# Patient Record
Sex: Male | Born: 1945
Health system: Southern US, Community
[De-identification: ages and names within clinical notes are randomized; demographics above are authoritative.]

## PROBLEM LIST (undated history)

## (undated) DIAGNOSIS — I4891 Unspecified atrial fibrillation: Secondary | ICD-10-CM

## (undated) DIAGNOSIS — B182 Chronic viral hepatitis C: Secondary | ICD-10-CM

## (undated) DIAGNOSIS — I10 Essential (primary) hypertension: Secondary | ICD-10-CM

## (undated) DIAGNOSIS — K219 Gastro-esophageal reflux disease without esophagitis: Secondary | ICD-10-CM

## (undated) DIAGNOSIS — I509 Heart failure, unspecified: Secondary | ICD-10-CM

## (undated) DIAGNOSIS — G4730 Sleep apnea, unspecified: Secondary | ICD-10-CM

## (undated) DIAGNOSIS — E039 Hypothyroidism, unspecified: Secondary | ICD-10-CM

## (undated) DIAGNOSIS — M1990 Unspecified osteoarthritis, unspecified site: Secondary | ICD-10-CM

## (undated) DIAGNOSIS — N3289 Other specified disorders of bladder: Secondary | ICD-10-CM

## (undated) DIAGNOSIS — F1910 Other psychoactive substance abuse, uncomplicated: Secondary | ICD-10-CM

## (undated) DIAGNOSIS — A0472 Enterocolitis due to Clostridium difficile, not specified as recurrent: Secondary | ICD-10-CM

## (undated) DIAGNOSIS — G8929 Other chronic pain: Secondary | ICD-10-CM

## (undated) DIAGNOSIS — N319 Neuromuscular dysfunction of bladder, unspecified: Secondary | ICD-10-CM

## (undated) DIAGNOSIS — N400 Enlarged prostate without lower urinary tract symptoms: Secondary | ICD-10-CM

## (undated) DIAGNOSIS — D649 Anemia, unspecified: Secondary | ICD-10-CM

## (undated) DIAGNOSIS — E871 Hypo-osmolality and hyponatremia: Secondary | ICD-10-CM

## (undated) DIAGNOSIS — K769 Liver disease, unspecified: Secondary | ICD-10-CM

## (undated) DIAGNOSIS — E669 Obesity, unspecified: Secondary | ICD-10-CM

## (undated) DIAGNOSIS — Z5189 Encounter for other specified aftercare: Secondary | ICD-10-CM

## (undated) DIAGNOSIS — F329 Major depressive disorder, single episode, unspecified: Secondary | ICD-10-CM

## (undated) DIAGNOSIS — E785 Hyperlipidemia, unspecified: Secondary | ICD-10-CM

## (undated) DIAGNOSIS — Z22322 Carrier or suspected carrier of Methicillin resistant Staphylococcus aureus: Secondary | ICD-10-CM

## (undated) DIAGNOSIS — B1920 Unspecified viral hepatitis C without hepatic coma: Secondary | ICD-10-CM

## (undated) DIAGNOSIS — F32A Depression, unspecified: Secondary | ICD-10-CM

## (undated) HISTORY — DX: Hyperlipidemia, unspecified: E78.5

## (undated) HISTORY — PX: LUMBAR DISC SURGERY: SHX700

## (undated) HISTORY — PX: SUPRAPUBIC CATHETER INSERTION: SUR719

## (undated) HISTORY — DX: Obesity, unspecified: E66.9

## (undated) HISTORY — DX: Unspecified atrial fibrillation: I48.91

## (undated) HISTORY — DX: Liver disease, unspecified: K76.9

## (undated) HISTORY — DX: Hypo-osmolality and hyponatremia: E87.1

## (undated) HISTORY — DX: Major depressive disorder, single episode, unspecified: F32.9

## (undated) HISTORY — DX: Chronic viral hepatitis C: B18.2

## (undated) HISTORY — PX: COLONOSCOPY: SHX174

## (undated) HISTORY — DX: Benign prostatic hyperplasia without lower urinary tract symptoms: N40.0

## (undated) HISTORY — DX: Anemia, unspecified: D64.9

## (undated) HISTORY — DX: Other psychoactive substance abuse, uncomplicated: F19.10

## (undated) HISTORY — DX: Encounter for other specified aftercare: Z51.89

## (undated) HISTORY — PX: SPINE SURGERY: SHX786

## (undated) HISTORY — DX: Other chronic pain: G89.29

## (undated) HISTORY — DX: Heart failure, unspecified: I50.9

## (undated) HISTORY — DX: Unspecified viral hepatitis C without hepatic coma: B19.20

## (undated) HISTORY — PX: MEDIAL PARTIAL KNEE REPLACEMENT: SHX5965

## (undated) HISTORY — DX: Enterocolitis due to Clostridium difficile, not specified as recurrent: A04.72

## (undated) HISTORY — PX: LUMBAR EPIDURAL INJECTION: SHX1980

## (undated) HISTORY — DX: Depression, unspecified: F32.A

## (undated) HISTORY — DX: Gastro-esophageal reflux disease without esophagitis: K21.9

## (undated) HISTORY — DX: Unspecified osteoarthritis, unspecified site: M19.90

## (undated) HISTORY — DX: Sleep apnea, unspecified: G47.30

---

## 2007-07-30 ENCOUNTER — Observation Stay (HOSPITAL_COMMUNITY): Admission: EM | Admit: 2007-07-30 | Discharge: 2007-08-01 | Payer: Self-pay | Admitting: Emergency Medicine

## 2007-07-30 ENCOUNTER — Ambulatory Visit: Payer: Self-pay | Admitting: Internal Medicine

## 2007-07-31 ENCOUNTER — Encounter (INDEPENDENT_AMBULATORY_CARE_PROVIDER_SITE_OTHER): Payer: Self-pay | Admitting: Internal Medicine

## 2010-08-03 NOTE — Discharge Summary (Signed)
NAME:  Kenneth Rhodes, AMBLER NO.:  [id number]   MEDICAL RECORD NO.:  [id number]          PATIENT TYPE:  OBV   LOCATION:  3023                         FACILITY:  [facility]   PHYSICIAN:  Madaline Guthrie, M.D.    DATE OF BIRTH:  12-Dec-1945   DATE OF ADMISSION:  07/30/2007  DATE OF DISCHARGE:  08/01/2007                               DISCHARGE SUMMARY   DISCHARGE DIAGNOSES:  1. Syncopal episodes.  2. Postoperative back pain secondary to L2-S1 decompression surgery on      07/24/07  3. Adrenal suppression secondary to postoperative steroid      administration.  4. Anemia secondary to intraoperative blood loss.  5. Hypertension.  6. Benign prostatic hypertrophy.   The patient is being discharged on the following medications:  1. OxyContin 30 mg one tablet every 8 hours.  2. Oxycodone 15 mg one tablet every 3 hours as needed for pain.  3. Uroxatral 2 mg one tablet daily.  4. Prednisone taper: 20 mg one tablet daily for one week, then 10 mg      one tablet daily for one week, then 10 mg one tablet every other      day for one week, then 5 mg one tablet every other day for one      week, then stop.   The patient is being discharged in stable condition.  He continues to  have pain in the lower back.  He has had no repeated episodes of syncope  while in hospital.  His blood pressure is stable in the 130s/70s,  hemoglobin is stable at 10.2.  He will be following up with Dr. Luz Brazen  two weeks after his discharge from [facility].  He has been advised to  find a primary care physician in his hometown to follow up his anemia  and adrenal suppression.   Procedures:  1. Chest x-ray Jul 30, 2007, which shows low lung volumes, normal      heart size, no focal opacities or effusions, no acute findings.  2. CT angio of the chest Jul 30, 2007, no evidence of pulmonary      embolus  3. 2-D echo performed on Jul 31, 2007: EF 65-75%, mild LV thickening      and normal LV function.   There  were no consultations during this hospitalization.   Admitting H&P:  Kenneth Rhodes is a 65 year old male with a long history of back pain,  who had a recent surgical  decompression of L2-S1.  He was discharged at  noon on the day of admission from [facility] with the pain of 4-  5/10 after his discharge.  He reports ambulation and a delay in  receiving his pain medications, both of which caused the pain in his  lower back and thighs to worsen.  After about 1 hour of riding in a car,  he suddenly lost conciousness. Family reports that his eyes rolled back,  had diffuse muscle spasms and he stopped breathing for a few seconds.  911 was called.  The patient revived briefly and then lost consciousness  again.  EMS arrived, reported that he  was hypotensive with a systolic  blood pressure of 68.  At that time, the patient denied admission and  continued on his way home.  The famly stopped in [place] for lunch.  Kenneth Rhodes got out of the car walked a few steps and developed chest  pain, shortness of breath, lightheadedness.  EMS was again contacted and  he presented to the [facility] Emergency Department.  The patient  reports that on the day of admission, he received oxytocodone 15 mg at  2:30 a.m., 30 mg at noon on his discharge, and Flexeril at 3 p.m.  He  feels that his symptoms began shortly after taking the Flexeril.   Admission vitals:  Temperature 96.8, blood pressure 106/44, second reading 148/105, pulse  72-80, respirations 16, and O2 97% on room air.  Exam showed the patient to be in mild discomfort.  Lungs were clear to  auscultation bilaterally.  Cardiovascular exam was benign: no murmurs,  rubs, or gallops.  Abdominal exam was benign.  Neurologic exam was  nonfocal.  Sodium was 135, potassium four, chloride 99, bicarb 27, BUN 18,  creatinine 1.1, glucose 149, bilirubin 0.9, alk phos 53, AST 24, ALT 84,  protein 5.9, albumin 3.3, calcium 8.8.  White blood cell count  was 13,  hemoglobin 12, hematocrit 33, platelets 285, absolute neutrophil count  was 10.5.  Chest x-ray as noted previously was negative for acute  processes.  UA showed small leukocyte esterase with rare bacteria.   HOSPITAL COURSE BY PROBLEM:  1. Syncopal events: Most likely multifactorial with a combination of      high dose narcotics and adrenal suppression after having high-dose      steroid administration.  Cortisol level drawn in the morning of Jul 31, 2007 was 4.6 (low).  This is most likely secondary to steroid      administration 4mg  of dexamethasone every 6 hours for four days      after his surgical procedure, which caused adrenal suppression. The      patient was maintained off of his narcotic pain medications for the      first 24 hours in the hospital.  Blood pressures returned to 120s-      130s/70s-80s.  His pain regimen was then reinstated with out      flexaril.  His blood pressure remained stable.  The patient was      ruled out for acute coronary syndrome with three sets of negative      cardiac enzymes as well as serial EKGs which showed no change.  2-D      echo shows EF of 65-75% with good LV function.  He is currently      asymptomatic and has been advised to follow up with the primary      care physician if his symptoms recur.  2. Postoperative pain.  This is to be expected.  Dr. Luz Brazen reports      that this patient had pain which was difficult to control in the      hospital.  He is being discharged on the pain regimen prescribed by      Dr. Luz Brazen which is OxyContin 30 mg one tablet every 8 hours and      oxycodone 15 mg one tablet every 3 hours as needed for pain.  He      will follow up with Dr. Luz Brazen.  3. Adrenal suppression secondary to steroid administration: As  discussed in #1 Mr. Highfill had been on 4 mg of Decadron q.6 h.      for 4 days in hospital.  Cortisol levels drawn on Jul 31, 2007 was      4.6.  He is being discharged on a  prednisone taper and has been      advised that he should follow up with the primary care physician      regarding this issue.  4. Anemia.  On admission, Mr. Kerin was anemic with a hemoglobin      of 11.8, which dropped slightly after hydration in the hospital to      10.2, but then remained stable for the 24 hours prior to discharge.      Records from the [facility] indicates that there was      significant blood loss during his surgical procedure.  He was      discharged with a hemoglobin of 9.2 from the [facility].  Anemia panel of 07/31/07 shows a low iron level of 43, low      TIBC of 211 and normal ferritin.  He should recieve iron      supplimentation in the future and should recieve a colonscopy to      rule out other source of blood loss. The patient was then advised      that he should follow up with the primary care physician regarding      this issue as well.  5. History of Hypertension.  The patient was not hypertensive in the      hospital and was not on any hypertensive medications.  6. Benign prostatic hypertrophy.  This was not an issue during the      hospitalization.  He is being discharged on his previous regimen of      Uroxatral.   On the day of discharge, temperature 98.9, pulse 78, blood pressure  125/79, respirations 18, O2 95% on room air.  Sodium 134, potassium  33.7, chloride 100, bicarb 28, BUN 8 creatinine 0.89, glucose 94,  calcium 7.8, white blood cell count 8.3, hemoglobin 10.2, hematocrit  29.7, platelets 241.  Of note, urine culture shows a mixed bacterial  picture suggestive of inappropriate collection.  Also of note,  hemoglobin A1c drawn on Jul 30, 2007, is 4.7.      Elby Showers, MD  Electronically Signed      Madaline Guthrie, M.D.  Electronically Signed    CW/MEDQ  D:  08/01/2007  T:  08/01/2007  Job:  [id number]   cc:   Wendall Stade

## 2013-03-27 DIAGNOSIS — G4733 Obstructive sleep apnea (adult) (pediatric): Secondary | ICD-10-CM | POA: Diagnosis not present

## 2013-03-27 DIAGNOSIS — E860 Dehydration: Secondary | ICD-10-CM | POA: Diagnosis not present

## 2013-03-27 DIAGNOSIS — D649 Anemia, unspecified: Secondary | ICD-10-CM | POA: Diagnosis present

## 2013-03-27 DIAGNOSIS — I10 Essential (primary) hypertension: Secondary | ICD-10-CM | POA: Diagnosis not present

## 2013-03-27 DIAGNOSIS — G8929 Other chronic pain: Secondary | ICD-10-CM | POA: Diagnosis present

## 2013-03-27 DIAGNOSIS — K5900 Constipation, unspecified: Secondary | ICD-10-CM | POA: Diagnosis not present

## 2013-03-27 DIAGNOSIS — E291 Testicular hypofunction: Secondary | ICD-10-CM | POA: Diagnosis not present

## 2013-03-27 DIAGNOSIS — M25569 Pain in unspecified knee: Secondary | ICD-10-CM | POA: Diagnosis not present

## 2013-03-27 DIAGNOSIS — F3289 Other specified depressive episodes: Secondary | ICD-10-CM | POA: Diagnosis present

## 2013-03-27 DIAGNOSIS — M549 Dorsalgia, unspecified: Secondary | ICD-10-CM | POA: Diagnosis present

## 2013-03-27 DIAGNOSIS — Z981 Arthrodesis status: Secondary | ICD-10-CM | POA: Diagnosis not present

## 2013-03-27 DIAGNOSIS — E039 Hypothyroidism, unspecified: Secondary | ICD-10-CM | POA: Diagnosis not present

## 2013-03-27 DIAGNOSIS — N400 Enlarged prostate without lower urinary tract symptoms: Secondary | ICD-10-CM | POA: Diagnosis present

## 2013-03-27 DIAGNOSIS — M069 Rheumatoid arthritis, unspecified: Secondary | ICD-10-CM | POA: Diagnosis not present

## 2013-03-27 DIAGNOSIS — Z471 Aftercare following joint replacement surgery: Secondary | ICD-10-CM | POA: Diagnosis not present

## 2013-03-27 DIAGNOSIS — F329 Major depressive disorder, single episode, unspecified: Secondary | ICD-10-CM | POA: Diagnosis present

## 2013-03-27 DIAGNOSIS — R109 Unspecified abdominal pain: Secondary | ICD-10-CM | POA: Diagnosis not present

## 2013-03-27 DIAGNOSIS — M1710 Unilateral primary osteoarthritis, unspecified knee: Secondary | ICD-10-CM | POA: Diagnosis not present

## 2013-03-27 DIAGNOSIS — Z96659 Presence of unspecified artificial knee joint: Secondary | ICD-10-CM | POA: Diagnosis not present

## 2013-04-09 DIAGNOSIS — M1710 Unilateral primary osteoarthritis, unspecified knee: Secondary | ICD-10-CM | POA: Diagnosis not present

## 2013-04-09 DIAGNOSIS — Z96659 Presence of unspecified artificial knee joint: Secondary | ICD-10-CM | POA: Diagnosis not present

## 2013-04-11 DIAGNOSIS — M1710 Unilateral primary osteoarthritis, unspecified knee: Secondary | ICD-10-CM | POA: Diagnosis not present

## 2013-04-11 DIAGNOSIS — Z96659 Presence of unspecified artificial knee joint: Secondary | ICD-10-CM | POA: Diagnosis not present

## 2013-04-12 DIAGNOSIS — Z96659 Presence of unspecified artificial knee joint: Secondary | ICD-10-CM | POA: Diagnosis not present

## 2013-04-12 DIAGNOSIS — M1710 Unilateral primary osteoarthritis, unspecified knee: Secondary | ICD-10-CM | POA: Diagnosis not present

## 2013-04-15 DIAGNOSIS — M1710 Unilateral primary osteoarthritis, unspecified knee: Secondary | ICD-10-CM | POA: Diagnosis not present

## 2013-04-15 DIAGNOSIS — Z96659 Presence of unspecified artificial knee joint: Secondary | ICD-10-CM | POA: Diagnosis not present

## 2013-04-17 DIAGNOSIS — M1710 Unilateral primary osteoarthritis, unspecified knee: Secondary | ICD-10-CM | POA: Diagnosis not present

## 2013-04-17 DIAGNOSIS — Z96659 Presence of unspecified artificial knee joint: Secondary | ICD-10-CM | POA: Diagnosis not present

## 2013-04-19 DIAGNOSIS — Z96659 Presence of unspecified artificial knee joint: Secondary | ICD-10-CM | POA: Diagnosis not present

## 2013-04-19 DIAGNOSIS — M1710 Unilateral primary osteoarthritis, unspecified knee: Secondary | ICD-10-CM | POA: Diagnosis not present

## 2013-04-22 DIAGNOSIS — Z96659 Presence of unspecified artificial knee joint: Secondary | ICD-10-CM | POA: Diagnosis not present

## 2013-04-22 DIAGNOSIS — M1710 Unilateral primary osteoarthritis, unspecified knee: Secondary | ICD-10-CM | POA: Diagnosis not present

## 2013-04-23 DIAGNOSIS — Z96659 Presence of unspecified artificial knee joint: Secondary | ICD-10-CM | POA: Diagnosis not present

## 2013-04-23 DIAGNOSIS — M1710 Unilateral primary osteoarthritis, unspecified knee: Secondary | ICD-10-CM | POA: Diagnosis not present

## 2013-04-24 DIAGNOSIS — M1710 Unilateral primary osteoarthritis, unspecified knee: Secondary | ICD-10-CM | POA: Diagnosis not present

## 2013-04-24 DIAGNOSIS — Z96659 Presence of unspecified artificial knee joint: Secondary | ICD-10-CM | POA: Diagnosis not present

## 2013-04-26 DIAGNOSIS — Z96659 Presence of unspecified artificial knee joint: Secondary | ICD-10-CM | POA: Diagnosis not present

## 2013-04-26 DIAGNOSIS — M1710 Unilateral primary osteoarthritis, unspecified knee: Secondary | ICD-10-CM | POA: Diagnosis not present

## 2013-08-01 DIAGNOSIS — M7989 Other specified soft tissue disorders: Secondary | ICD-10-CM | POA: Diagnosis not present

## 2013-08-01 DIAGNOSIS — R609 Edema, unspecified: Secondary | ICD-10-CM | POA: Diagnosis not present

## 2013-08-05 DIAGNOSIS — E039 Hypothyroidism, unspecified: Secondary | ICD-10-CM | POA: Diagnosis not present

## 2013-08-05 DIAGNOSIS — R0602 Shortness of breath: Secondary | ICD-10-CM | POA: Diagnosis not present

## 2013-08-05 DIAGNOSIS — M549 Dorsalgia, unspecified: Secondary | ICD-10-CM | POA: Diagnosis not present

## 2013-08-05 DIAGNOSIS — R069 Unspecified abnormalities of breathing: Secondary | ICD-10-CM | POA: Diagnosis not present

## 2013-08-05 DIAGNOSIS — R609 Edema, unspecified: Secondary | ICD-10-CM | POA: Diagnosis not present

## 2013-08-05 DIAGNOSIS — I10 Essential (primary) hypertension: Secondary | ICD-10-CM | POA: Diagnosis not present

## 2013-08-15 DIAGNOSIS — R609 Edema, unspecified: Secondary | ICD-10-CM | POA: Diagnosis not present

## 2013-08-15 DIAGNOSIS — K219 Gastro-esophageal reflux disease without esophagitis: Secondary | ICD-10-CM | POA: Diagnosis not present

## 2013-08-21 DIAGNOSIS — M539 Dorsopathy, unspecified: Secondary | ICD-10-CM | POA: Diagnosis not present

## 2013-08-21 DIAGNOSIS — Z981 Arthrodesis status: Secondary | ICD-10-CM | POA: Diagnosis not present

## 2013-08-21 DIAGNOSIS — M47817 Spondylosis without myelopathy or radiculopathy, lumbosacral region: Secondary | ICD-10-CM | POA: Diagnosis not present

## 2013-08-21 DIAGNOSIS — M961 Postlaminectomy syndrome, not elsewhere classified: Secondary | ICD-10-CM | POA: Diagnosis not present

## 2013-08-22 DIAGNOSIS — Z1211 Encounter for screening for malignant neoplasm of colon: Secondary | ICD-10-CM | POA: Diagnosis not present

## 2013-08-27 DIAGNOSIS — I10 Essential (primary) hypertension: Secondary | ICD-10-CM | POA: Diagnosis not present

## 2013-08-27 DIAGNOSIS — G8929 Other chronic pain: Secondary | ICD-10-CM | POA: Diagnosis not present

## 2013-08-27 DIAGNOSIS — Z800 Family history of malignant neoplasm of digestive organs: Secondary | ICD-10-CM | POA: Diagnosis not present

## 2013-08-27 DIAGNOSIS — E039 Hypothyroidism, unspecified: Secondary | ICD-10-CM | POA: Diagnosis not present

## 2013-08-27 DIAGNOSIS — G4730 Sleep apnea, unspecified: Secondary | ICD-10-CM | POA: Diagnosis not present

## 2013-08-27 DIAGNOSIS — Z8371 Family history of colonic polyps: Secondary | ICD-10-CM | POA: Diagnosis not present

## 2013-08-27 DIAGNOSIS — R1013 Epigastric pain: Secondary | ICD-10-CM | POA: Diagnosis not present

## 2013-09-03 DIAGNOSIS — M545 Low back pain, unspecified: Secondary | ICD-10-CM | POA: Diagnosis not present

## 2013-09-03 DIAGNOSIS — Z79899 Other long term (current) drug therapy: Secondary | ICD-10-CM | POA: Diagnosis not present

## 2013-09-03 DIAGNOSIS — M47814 Spondylosis without myelopathy or radiculopathy, thoracic region: Secondary | ICD-10-CM | POA: Diagnosis not present

## 2013-09-03 DIAGNOSIS — IMO0002 Reserved for concepts with insufficient information to code with codable children: Secondary | ICD-10-CM | POA: Diagnosis not present

## 2013-09-03 DIAGNOSIS — M47817 Spondylosis without myelopathy or radiculopathy, lumbosacral region: Secondary | ICD-10-CM | POA: Diagnosis not present

## 2013-09-03 DIAGNOSIS — M961 Postlaminectomy syndrome, not elsewhere classified: Secondary | ICD-10-CM | POA: Diagnosis not present

## 2013-09-03 DIAGNOSIS — M461 Sacroiliitis, not elsewhere classified: Secondary | ICD-10-CM | POA: Diagnosis not present

## 2013-09-03 DIAGNOSIS — M1990 Unspecified osteoarthritis, unspecified site: Secondary | ICD-10-CM | POA: Diagnosis not present

## 2013-09-03 DIAGNOSIS — E039 Hypothyroidism, unspecified: Secondary | ICD-10-CM | POA: Diagnosis not present

## 2013-09-03 DIAGNOSIS — M4804 Spinal stenosis, thoracic region: Secondary | ICD-10-CM | POA: Diagnosis not present

## 2013-09-03 DIAGNOSIS — M25569 Pain in unspecified knee: Secondary | ICD-10-CM | POA: Diagnosis not present

## 2013-10-01 DIAGNOSIS — M129 Arthropathy, unspecified: Secondary | ICD-10-CM | POA: Diagnosis not present

## 2013-10-01 DIAGNOSIS — M1710 Unilateral primary osteoarthritis, unspecified knee: Secondary | ICD-10-CM | POA: Diagnosis not present

## 2013-10-01 DIAGNOSIS — M4804 Spinal stenosis, thoracic region: Secondary | ICD-10-CM | POA: Diagnosis not present

## 2013-10-01 DIAGNOSIS — M545 Low back pain, unspecified: Secondary | ICD-10-CM | POA: Diagnosis not present

## 2013-10-01 DIAGNOSIS — IMO0002 Reserved for concepts with insufficient information to code with codable children: Secondary | ICD-10-CM | POA: Diagnosis not present

## 2013-10-01 DIAGNOSIS — M47817 Spondylosis without myelopathy or radiculopathy, lumbosacral region: Secondary | ICD-10-CM | POA: Diagnosis not present

## 2013-10-01 DIAGNOSIS — M961 Postlaminectomy syndrome, not elsewhere classified: Secondary | ICD-10-CM | POA: Diagnosis not present

## 2013-10-01 DIAGNOSIS — Z79899 Other long term (current) drug therapy: Secondary | ICD-10-CM | POA: Diagnosis not present

## 2013-10-01 DIAGNOSIS — M549 Dorsalgia, unspecified: Secondary | ICD-10-CM | POA: Diagnosis not present

## 2013-10-01 DIAGNOSIS — R030 Elevated blood-pressure reading, without diagnosis of hypertension: Secondary | ICD-10-CM | POA: Diagnosis not present

## 2013-10-01 DIAGNOSIS — M461 Sacroiliitis, not elsewhere classified: Secondary | ICD-10-CM | POA: Diagnosis not present

## 2013-10-03 DIAGNOSIS — M1710 Unilateral primary osteoarthritis, unspecified knee: Secondary | ICD-10-CM | POA: Diagnosis not present

## 2013-10-03 DIAGNOSIS — Z96659 Presence of unspecified artificial knee joint: Secondary | ICD-10-CM | POA: Diagnosis not present

## 2013-10-03 DIAGNOSIS — Z471 Aftercare following joint replacement surgery: Secondary | ICD-10-CM | POA: Diagnosis not present

## 2013-10-07 DIAGNOSIS — M549 Dorsalgia, unspecified: Secondary | ICD-10-CM | POA: Diagnosis not present

## 2013-10-07 DIAGNOSIS — I10 Essential (primary) hypertension: Secondary | ICD-10-CM | POA: Diagnosis not present

## 2013-10-07 DIAGNOSIS — M47817 Spondylosis without myelopathy or radiculopathy, lumbosacral region: Secondary | ICD-10-CM | POA: Diagnosis not present

## 2013-10-07 DIAGNOSIS — M5126 Other intervertebral disc displacement, lumbar region: Secondary | ICD-10-CM | POA: Diagnosis not present

## 2013-10-07 DIAGNOSIS — M545 Low back pain, unspecified: Secondary | ICD-10-CM | POA: Diagnosis not present

## 2013-10-07 DIAGNOSIS — Z79899 Other long term (current) drug therapy: Secondary | ICD-10-CM | POA: Diagnosis not present

## 2013-10-07 DIAGNOSIS — Z885 Allergy status to narcotic agent status: Secondary | ICD-10-CM | POA: Diagnosis not present

## 2013-10-07 DIAGNOSIS — R52 Pain, unspecified: Secondary | ICD-10-CM | POA: Diagnosis not present

## 2013-10-07 DIAGNOSIS — M48061 Spinal stenosis, lumbar region without neurogenic claudication: Secondary | ICD-10-CM | POA: Diagnosis not present

## 2013-10-08 DIAGNOSIS — M47817 Spondylosis without myelopathy or radiculopathy, lumbosacral region: Secondary | ICD-10-CM | POA: Diagnosis not present

## 2013-10-08 DIAGNOSIS — M5126 Other intervertebral disc displacement, lumbar region: Secondary | ICD-10-CM | POA: Diagnosis not present

## 2013-10-08 DIAGNOSIS — M48061 Spinal stenosis, lumbar region without neurogenic claudication: Secondary | ICD-10-CM | POA: Diagnosis not present

## 2013-10-29 DIAGNOSIS — G8929 Other chronic pain: Secondary | ICD-10-CM | POA: Diagnosis not present

## 2013-10-29 DIAGNOSIS — Z79899 Other long term (current) drug therapy: Secondary | ICD-10-CM | POA: Diagnosis not present

## 2013-10-29 DIAGNOSIS — M545 Low back pain, unspecified: Secondary | ICD-10-CM | POA: Diagnosis not present

## 2013-10-29 DIAGNOSIS — M5126 Other intervertebral disc displacement, lumbar region: Secondary | ICD-10-CM | POA: Diagnosis not present

## 2013-10-29 DIAGNOSIS — Z7982 Long term (current) use of aspirin: Secondary | ICD-10-CM | POA: Diagnosis not present

## 2013-10-29 DIAGNOSIS — I6789 Other cerebrovascular disease: Secondary | ICD-10-CM | POA: Diagnosis not present

## 2013-10-29 DIAGNOSIS — R209 Unspecified disturbances of skin sensation: Secondary | ICD-10-CM | POA: Diagnosis not present

## 2013-10-29 DIAGNOSIS — I10 Essential (primary) hypertension: Secondary | ICD-10-CM | POA: Diagnosis not present

## 2013-10-29 DIAGNOSIS — R509 Fever, unspecified: Secondary | ICD-10-CM | POA: Diagnosis not present

## 2013-10-29 DIAGNOSIS — M549 Dorsalgia, unspecified: Secondary | ICD-10-CM | POA: Diagnosis not present

## 2013-10-29 DIAGNOSIS — M546 Pain in thoracic spine: Secondary | ICD-10-CM | POA: Diagnosis not present

## 2013-10-30 DIAGNOSIS — R7881 Bacteremia: Secondary | ICD-10-CM | POA: Diagnosis not present

## 2013-10-30 DIAGNOSIS — M069 Rheumatoid arthritis, unspecified: Secondary | ICD-10-CM | POA: Diagnosis present

## 2013-10-30 DIAGNOSIS — A4101 Sepsis due to Methicillin susceptible Staphylococcus aureus: Secondary | ICD-10-CM | POA: Diagnosis not present

## 2013-10-30 DIAGNOSIS — G8929 Other chronic pain: Secondary | ICD-10-CM | POA: Diagnosis not present

## 2013-10-30 DIAGNOSIS — R142 Eructation: Secondary | ICD-10-CM | POA: Diagnosis not present

## 2013-10-30 DIAGNOSIS — R000 Tachycardia, unspecified: Secondary | ICD-10-CM | POA: Diagnosis present

## 2013-10-30 DIAGNOSIS — E039 Hypothyroidism, unspecified: Secondary | ICD-10-CM | POA: Diagnosis present

## 2013-10-30 DIAGNOSIS — G8928 Other chronic postprocedural pain: Secondary | ICD-10-CM | POA: Diagnosis not present

## 2013-10-30 DIAGNOSIS — I5033 Acute on chronic diastolic (congestive) heart failure: Secondary | ICD-10-CM | POA: Diagnosis not present

## 2013-10-30 DIAGNOSIS — R6520 Severe sepsis without septic shock: Secondary | ICD-10-CM | POA: Diagnosis present

## 2013-10-30 DIAGNOSIS — Z452 Encounter for adjustment and management of vascular access device: Secondary | ICD-10-CM | POA: Diagnosis not present

## 2013-10-30 DIAGNOSIS — N179 Acute kidney failure, unspecified: Secondary | ICD-10-CM | POA: Diagnosis not present

## 2013-10-30 DIAGNOSIS — K560 Paralytic ileus: Secondary | ICD-10-CM | POA: Diagnosis not present

## 2013-10-30 DIAGNOSIS — F329 Major depressive disorder, single episode, unspecified: Secondary | ICD-10-CM | POA: Diagnosis present

## 2013-10-30 DIAGNOSIS — Z7982 Long term (current) use of aspirin: Secondary | ICD-10-CM | POA: Diagnosis not present

## 2013-10-30 DIAGNOSIS — A419 Sepsis, unspecified organism: Secondary | ICD-10-CM | POA: Diagnosis present

## 2013-10-30 DIAGNOSIS — G4733 Obstructive sleep apnea (adult) (pediatric): Secondary | ICD-10-CM | POA: Diagnosis present

## 2013-10-30 DIAGNOSIS — N400 Enlarged prostate without lower urinary tract symptoms: Secondary | ICD-10-CM | POA: Diagnosis present

## 2013-10-30 DIAGNOSIS — I4891 Unspecified atrial fibrillation: Secondary | ICD-10-CM | POA: Diagnosis not present

## 2013-10-30 DIAGNOSIS — A4102 Sepsis due to Methicillin resistant Staphylococcus aureus: Secondary | ICD-10-CM | POA: Diagnosis not present

## 2013-10-30 DIAGNOSIS — G062 Extradural and subdural abscess, unspecified: Secondary | ICD-10-CM | POA: Diagnosis not present

## 2013-10-30 DIAGNOSIS — M549 Dorsalgia, unspecified: Secondary | ICD-10-CM | POA: Diagnosis not present

## 2013-10-30 DIAGNOSIS — Z79899 Other long term (current) drug therapy: Secondary | ICD-10-CM | POA: Diagnosis not present

## 2013-10-30 DIAGNOSIS — I749 Embolism and thrombosis of unspecified artery: Secondary | ICD-10-CM | POA: Diagnosis present

## 2013-10-30 DIAGNOSIS — D72829 Elevated white blood cell count, unspecified: Secondary | ICD-10-CM | POA: Diagnosis not present

## 2013-10-30 DIAGNOSIS — G8220 Paraplegia, unspecified: Secondary | ICD-10-CM | POA: Diagnosis not present

## 2013-10-30 DIAGNOSIS — R509 Fever, unspecified: Secondary | ICD-10-CM | POA: Diagnosis not present

## 2013-10-30 DIAGNOSIS — Z6841 Body Mass Index (BMI) 40.0 and over, adult: Secondary | ICD-10-CM | POA: Diagnosis not present

## 2013-10-30 DIAGNOSIS — K6389 Other specified diseases of intestine: Secondary | ICD-10-CM | POA: Diagnosis not present

## 2013-10-30 DIAGNOSIS — R29898 Other symptoms and signs involving the musculoskeletal system: Secondary | ICD-10-CM | POA: Diagnosis not present

## 2013-10-30 DIAGNOSIS — M4804 Spinal stenosis, thoracic region: Secondary | ICD-10-CM | POA: Diagnosis not present

## 2013-10-30 DIAGNOSIS — I10 Essential (primary) hypertension: Secondary | ICD-10-CM | POA: Diagnosis not present

## 2013-10-30 DIAGNOSIS — G959 Disease of spinal cord, unspecified: Secondary | ICD-10-CM | POA: Diagnosis present

## 2013-10-30 DIAGNOSIS — E871 Hypo-osmolality and hyponatremia: Secondary | ICD-10-CM | POA: Diagnosis not present

## 2013-10-30 DIAGNOSIS — M545 Low back pain, unspecified: Secondary | ICD-10-CM | POA: Diagnosis not present

## 2013-10-30 DIAGNOSIS — I517 Cardiomegaly: Secondary | ICD-10-CM | POA: Diagnosis not present

## 2013-10-30 DIAGNOSIS — K5989 Other specified functional intestinal disorders: Secondary | ICD-10-CM | POA: Diagnosis not present

## 2013-10-30 DIAGNOSIS — I509 Heart failure, unspecified: Secondary | ICD-10-CM | POA: Diagnosis not present

## 2013-10-30 DIAGNOSIS — G061 Intraspinal abscess and granuloma: Secondary | ICD-10-CM | POA: Diagnosis not present

## 2013-10-30 DIAGNOSIS — F3289 Other specified depressive episodes: Secondary | ICD-10-CM | POA: Diagnosis present

## 2013-10-30 DIAGNOSIS — Z0389 Encounter for observation for other suspected diseases and conditions ruled out: Secondary | ICD-10-CM | POA: Diagnosis not present

## 2013-10-30 DIAGNOSIS — R0609 Other forms of dyspnea: Secondary | ICD-10-CM | POA: Diagnosis not present

## 2013-10-30 DIAGNOSIS — R141 Gas pain: Secondary | ICD-10-CM | POA: Diagnosis not present

## 2013-11-08 DIAGNOSIS — E039 Hypothyroidism, unspecified: Secondary | ICD-10-CM | POA: Diagnosis not present

## 2013-11-08 DIAGNOSIS — F411 Generalized anxiety disorder: Secondary | ICD-10-CM | POA: Diagnosis present

## 2013-11-08 DIAGNOSIS — R112 Nausea with vomiting, unspecified: Secondary | ICD-10-CM | POA: Diagnosis not present

## 2013-11-08 DIAGNOSIS — Z6841 Body Mass Index (BMI) 40.0 and over, adult: Secondary | ICD-10-CM | POA: Diagnosis not present

## 2013-11-08 DIAGNOSIS — G062 Extradural and subdural abscess, unspecified: Secondary | ICD-10-CM | POA: Diagnosis not present

## 2013-11-08 DIAGNOSIS — R141 Gas pain: Secondary | ICD-10-CM | POA: Diagnosis not present

## 2013-11-08 DIAGNOSIS — K929 Disease of digestive system, unspecified: Secondary | ICD-10-CM | POA: Diagnosis present

## 2013-11-08 DIAGNOSIS — R509 Fever, unspecified: Secondary | ICD-10-CM | POA: Diagnosis not present

## 2013-11-08 DIAGNOSIS — I10 Essential (primary) hypertension: Secondary | ICD-10-CM | POA: Diagnosis not present

## 2013-11-08 DIAGNOSIS — K219 Gastro-esophageal reflux disease without esophagitis: Secondary | ICD-10-CM | POA: Diagnosis not present

## 2013-11-08 DIAGNOSIS — D509 Iron deficiency anemia, unspecified: Secondary | ICD-10-CM | POA: Diagnosis not present

## 2013-11-08 DIAGNOSIS — A419 Sepsis, unspecified organism: Secondary | ICD-10-CM | POA: Diagnosis not present

## 2013-11-08 DIAGNOSIS — R197 Diarrhea, unspecified: Secondary | ICD-10-CM | POA: Diagnosis not present

## 2013-11-08 DIAGNOSIS — S14101A Unspecified injury at C1 level of cervical spinal cord, initial encounter: Secondary | ICD-10-CM | POA: Diagnosis not present

## 2013-11-08 DIAGNOSIS — R066 Hiccough: Secondary | ICD-10-CM | POA: Diagnosis not present

## 2013-11-08 DIAGNOSIS — R142 Eructation: Secondary | ICD-10-CM | POA: Diagnosis not present

## 2013-11-08 DIAGNOSIS — G061 Intraspinal abscess and granuloma: Secondary | ICD-10-CM | POA: Diagnosis not present

## 2013-11-08 DIAGNOSIS — A4902 Methicillin resistant Staphylococcus aureus infection, unspecified site: Secondary | ICD-10-CM | POA: Diagnosis not present

## 2013-11-08 DIAGNOSIS — N400 Enlarged prostate without lower urinary tract symptoms: Secondary | ICD-10-CM | POA: Diagnosis not present

## 2013-11-08 DIAGNOSIS — R0902 Hypoxemia: Secondary | ICD-10-CM | POA: Diagnosis not present

## 2013-11-08 DIAGNOSIS — A4102 Sepsis due to Methicillin resistant Staphylococcus aureus: Secondary | ICD-10-CM | POA: Diagnosis not present

## 2013-11-08 DIAGNOSIS — M069 Rheumatoid arthritis, unspecified: Secondary | ICD-10-CM | POA: Diagnosis present

## 2013-11-08 DIAGNOSIS — J189 Pneumonia, unspecified organism: Secondary | ICD-10-CM | POA: Diagnosis not present

## 2013-11-08 DIAGNOSIS — Z0389 Encounter for observation for other suspected diseases and conditions ruled out: Secondary | ICD-10-CM | POA: Diagnosis not present

## 2013-11-08 DIAGNOSIS — G4733 Obstructive sleep apnea (adult) (pediatric): Secondary | ICD-10-CM | POA: Diagnosis not present

## 2013-11-08 DIAGNOSIS — K592 Neurogenic bowel, not elsewhere classified: Secondary | ICD-10-CM | POA: Diagnosis not present

## 2013-11-08 DIAGNOSIS — I4892 Unspecified atrial flutter: Secondary | ICD-10-CM | POA: Diagnosis not present

## 2013-11-08 DIAGNOSIS — R51 Headache: Secondary | ICD-10-CM | POA: Diagnosis not present

## 2013-11-08 DIAGNOSIS — K2100 Gastro-esophageal reflux disease with esophagitis, without bleeding: Secondary | ICD-10-CM | POA: Diagnosis not present

## 2013-11-08 DIAGNOSIS — K560 Paralytic ileus: Secondary | ICD-10-CM | POA: Diagnosis present

## 2013-11-08 DIAGNOSIS — M549 Dorsalgia, unspecified: Secondary | ICD-10-CM | POA: Diagnosis not present

## 2013-11-08 DIAGNOSIS — N281 Cyst of kidney, acquired: Secondary | ICD-10-CM | POA: Diagnosis not present

## 2013-11-08 DIAGNOSIS — D638 Anemia in other chronic diseases classified elsewhere: Secondary | ICD-10-CM | POA: Diagnosis not present

## 2013-11-08 DIAGNOSIS — F3289 Other specified depressive episodes: Secondary | ICD-10-CM | POA: Diagnosis present

## 2013-11-08 DIAGNOSIS — G8929 Other chronic pain: Secondary | ICD-10-CM | POA: Diagnosis present

## 2013-11-08 DIAGNOSIS — R109 Unspecified abdominal pain: Secondary | ICD-10-CM | POA: Diagnosis not present

## 2013-11-08 DIAGNOSIS — I5032 Chronic diastolic (congestive) heart failure: Secondary | ICD-10-CM | POA: Diagnosis not present

## 2013-11-08 DIAGNOSIS — M8618 Other acute osteomyelitis, other site: Secondary | ICD-10-CM | POA: Diagnosis not present

## 2013-11-08 DIAGNOSIS — A4101 Sepsis due to Methicillin susceptible Staphylococcus aureus: Secondary | ICD-10-CM | POA: Diagnosis not present

## 2013-11-08 DIAGNOSIS — Z87442 Personal history of urinary calculi: Secondary | ICD-10-CM | POA: Diagnosis not present

## 2013-11-08 DIAGNOSIS — N179 Acute kidney failure, unspecified: Secondary | ICD-10-CM | POA: Diagnosis not present

## 2013-11-08 DIAGNOSIS — R9431 Abnormal electrocardiogram [ECG] [EKG]: Secondary | ICD-10-CM | POA: Diagnosis not present

## 2013-11-08 DIAGNOSIS — I4891 Unspecified atrial fibrillation: Secondary | ICD-10-CM | POA: Diagnosis not present

## 2013-11-08 DIAGNOSIS — N319 Neuromuscular dysfunction of bladder, unspecified: Secondary | ICD-10-CM | POA: Diagnosis not present

## 2013-11-08 DIAGNOSIS — I509 Heart failure, unspecified: Secondary | ICD-10-CM | POA: Diagnosis present

## 2013-11-08 DIAGNOSIS — E871 Hypo-osmolality and hyponatremia: Secondary | ICD-10-CM | POA: Diagnosis not present

## 2013-11-08 DIAGNOSIS — Z5189 Encounter for other specified aftercare: Secondary | ICD-10-CM | POA: Diagnosis not present

## 2013-11-08 DIAGNOSIS — F329 Major depressive disorder, single episode, unspecified: Secondary | ICD-10-CM | POA: Diagnosis present

## 2013-11-08 DIAGNOSIS — IMO0002 Reserved for concepts with insufficient information to code with codable children: Secondary | ICD-10-CM | POA: Diagnosis not present

## 2013-11-08 DIAGNOSIS — G8220 Paraplegia, unspecified: Secondary | ICD-10-CM | POA: Diagnosis not present

## 2013-11-08 DIAGNOSIS — Z79899 Other long term (current) drug therapy: Secondary | ICD-10-CM | POA: Diagnosis not present

## 2013-11-08 DIAGNOSIS — E876 Hypokalemia: Secondary | ICD-10-CM | POA: Diagnosis not present

## 2013-11-08 DIAGNOSIS — K319 Disease of stomach and duodenum, unspecified: Secondary | ICD-10-CM | POA: Diagnosis not present

## 2013-11-08 DIAGNOSIS — R7881 Bacteremia: Secondary | ICD-10-CM | POA: Diagnosis not present

## 2013-11-13 DIAGNOSIS — N400 Enlarged prostate without lower urinary tract symptoms: Secondary | ICD-10-CM | POA: Diagnosis not present

## 2013-11-13 DIAGNOSIS — I10 Essential (primary) hypertension: Secondary | ICD-10-CM | POA: Diagnosis not present

## 2013-11-13 DIAGNOSIS — G4733 Obstructive sleep apnea (adult) (pediatric): Secondary | ICD-10-CM | POA: Diagnosis not present

## 2013-11-13 DIAGNOSIS — R109 Unspecified abdominal pain: Secondary | ICD-10-CM | POA: Diagnosis not present

## 2013-11-13 DIAGNOSIS — K319 Disease of stomach and duodenum, unspecified: Secondary | ICD-10-CM | POA: Diagnosis not present

## 2013-11-13 DIAGNOSIS — Z87442 Personal history of urinary calculi: Secondary | ICD-10-CM | POA: Diagnosis not present

## 2013-11-13 DIAGNOSIS — E039 Hypothyroidism, unspecified: Secondary | ICD-10-CM | POA: Diagnosis not present

## 2013-11-13 DIAGNOSIS — I4891 Unspecified atrial fibrillation: Secondary | ICD-10-CM | POA: Diagnosis not present

## 2013-11-13 DIAGNOSIS — R9431 Abnormal electrocardiogram [ECG] [EKG]: Secondary | ICD-10-CM | POA: Diagnosis not present

## 2013-11-13 DIAGNOSIS — K219 Gastro-esophageal reflux disease without esophagitis: Secondary | ICD-10-CM | POA: Diagnosis not present

## 2013-11-13 DIAGNOSIS — K2100 Gastro-esophageal reflux disease with esophagitis, without bleeding: Secondary | ICD-10-CM | POA: Diagnosis not present

## 2013-11-14 DIAGNOSIS — R9431 Abnormal electrocardiogram [ECG] [EKG]: Secondary | ICD-10-CM | POA: Diagnosis not present

## 2013-11-14 DIAGNOSIS — Z87442 Personal history of urinary calculi: Secondary | ICD-10-CM | POA: Diagnosis not present

## 2013-11-14 DIAGNOSIS — I10 Essential (primary) hypertension: Secondary | ICD-10-CM | POA: Diagnosis not present

## 2013-11-14 DIAGNOSIS — G4733 Obstructive sleep apnea (adult) (pediatric): Secondary | ICD-10-CM | POA: Diagnosis not present

## 2013-11-14 DIAGNOSIS — K319 Disease of stomach and duodenum, unspecified: Secondary | ICD-10-CM | POA: Diagnosis not present

## 2013-11-14 DIAGNOSIS — N400 Enlarged prostate without lower urinary tract symptoms: Secondary | ICD-10-CM | POA: Diagnosis not present

## 2013-11-14 DIAGNOSIS — E039 Hypothyroidism, unspecified: Secondary | ICD-10-CM | POA: Diagnosis not present

## 2013-12-05 DIAGNOSIS — M8618 Other acute osteomyelitis, other site: Secondary | ICD-10-CM | POA: Diagnosis not present

## 2013-12-05 DIAGNOSIS — R339 Retention of urine, unspecified: Secondary | ICD-10-CM | POA: Diagnosis not present

## 2013-12-05 DIAGNOSIS — I5032 Chronic diastolic (congestive) heart failure: Secondary | ICD-10-CM | POA: Diagnosis not present

## 2013-12-05 DIAGNOSIS — Z5181 Encounter for therapeutic drug level monitoring: Secondary | ICD-10-CM | POA: Diagnosis not present

## 2013-12-05 DIAGNOSIS — D649 Anemia, unspecified: Secondary | ICD-10-CM | POA: Diagnosis not present

## 2013-12-05 DIAGNOSIS — N400 Enlarged prostate without lower urinary tract symptoms: Secondary | ICD-10-CM | POA: Diagnosis not present

## 2013-12-05 DIAGNOSIS — D500 Iron deficiency anemia secondary to blood loss (chronic): Secondary | ICD-10-CM | POA: Diagnosis not present

## 2013-12-05 DIAGNOSIS — Z79899 Other long term (current) drug therapy: Secondary | ICD-10-CM | POA: Diagnosis not present

## 2013-12-05 DIAGNOSIS — G061 Intraspinal abscess and granuloma: Secondary | ICD-10-CM | POA: Diagnosis not present

## 2013-12-05 DIAGNOSIS — G8220 Paraplegia, unspecified: Secondary | ICD-10-CM | POA: Diagnosis not present

## 2013-12-05 DIAGNOSIS — R066 Hiccough: Secondary | ICD-10-CM | POA: Diagnosis not present

## 2013-12-05 DIAGNOSIS — E876 Hypokalemia: Secondary | ICD-10-CM | POA: Diagnosis not present

## 2013-12-05 DIAGNOSIS — N390 Urinary tract infection, site not specified: Secondary | ICD-10-CM | POA: Diagnosis not present

## 2013-12-05 DIAGNOSIS — K592 Neurogenic bowel, not elsewhere classified: Secondary | ICD-10-CM | POA: Diagnosis not present

## 2013-12-05 DIAGNOSIS — R7982 Elevated C-reactive protein (CRP): Secondary | ICD-10-CM | POA: Diagnosis not present

## 2013-12-05 DIAGNOSIS — D638 Anemia in other chronic diseases classified elsewhere: Secondary | ICD-10-CM | POA: Diagnosis not present

## 2013-12-05 DIAGNOSIS — E039 Hypothyroidism, unspecified: Secondary | ICD-10-CM | POA: Diagnosis not present

## 2013-12-05 DIAGNOSIS — K219 Gastro-esophageal reflux disease without esophagitis: Secondary | ICD-10-CM | POA: Diagnosis not present

## 2013-12-05 DIAGNOSIS — Z23 Encounter for immunization: Secondary | ICD-10-CM | POA: Diagnosis not present

## 2013-12-05 DIAGNOSIS — Z5189 Encounter for other specified aftercare: Secondary | ICD-10-CM | POA: Diagnosis not present

## 2013-12-05 DIAGNOSIS — R3919 Other difficulties with micturition: Secondary | ICD-10-CM | POA: Diagnosis not present

## 2013-12-05 DIAGNOSIS — R3911 Hesitancy of micturition: Secondary | ICD-10-CM | POA: Diagnosis not present

## 2013-12-05 DIAGNOSIS — I4892 Unspecified atrial flutter: Secondary | ICD-10-CM | POA: Diagnosis not present

## 2013-12-05 DIAGNOSIS — I10 Essential (primary) hypertension: Secondary | ICD-10-CM | POA: Diagnosis not present

## 2013-12-05 DIAGNOSIS — N319 Neuromuscular dysfunction of bladder, unspecified: Secondary | ICD-10-CM | POA: Diagnosis not present

## 2013-12-05 DIAGNOSIS — S14101A Unspecified injury at C1 level of cervical spinal cord, initial encounter: Secondary | ICD-10-CM | POA: Diagnosis not present

## 2013-12-05 DIAGNOSIS — N179 Acute kidney failure, unspecified: Secondary | ICD-10-CM | POA: Diagnosis not present

## 2013-12-05 DIAGNOSIS — Z7901 Long term (current) use of anticoagulants: Secondary | ICD-10-CM | POA: Diagnosis not present

## 2013-12-05 DIAGNOSIS — I4891 Unspecified atrial fibrillation: Secondary | ICD-10-CM | POA: Diagnosis not present

## 2013-12-05 DIAGNOSIS — A4102 Sepsis due to Methicillin resistant Staphylococcus aureus: Secondary | ICD-10-CM | POA: Diagnosis not present

## 2013-12-06 DIAGNOSIS — K592 Neurogenic bowel, not elsewhere classified: Secondary | ICD-10-CM | POA: Diagnosis not present

## 2013-12-06 DIAGNOSIS — K219 Gastro-esophageal reflux disease without esophagitis: Secondary | ICD-10-CM | POA: Diagnosis not present

## 2013-12-06 DIAGNOSIS — S14101A Unspecified injury at C1 level of cervical spinal cord, initial encounter: Secondary | ICD-10-CM | POA: Diagnosis not present

## 2013-12-06 DIAGNOSIS — I4891 Unspecified atrial fibrillation: Secondary | ICD-10-CM | POA: Diagnosis not present

## 2013-12-29 DIAGNOSIS — N390 Urinary tract infection, site not specified: Secondary | ICD-10-CM | POA: Diagnosis not present

## 2014-01-20 DIAGNOSIS — R339 Retention of urine, unspecified: Secondary | ICD-10-CM | POA: Diagnosis not present

## 2014-01-20 DIAGNOSIS — R3919 Other difficulties with micturition: Secondary | ICD-10-CM | POA: Diagnosis not present

## 2014-01-20 DIAGNOSIS — R3911 Hesitancy of micturition: Secondary | ICD-10-CM | POA: Diagnosis not present

## 2014-01-31 ENCOUNTER — Telehealth: Payer: Self-pay | Admitting: Family Medicine

## 2014-01-31 NOTE — Telephone Encounter (Signed)
Appt given per patient request 

## 2014-02-03 DIAGNOSIS — M545 Low back pain: Secondary | ICD-10-CM | POA: Diagnosis not present

## 2014-02-03 DIAGNOSIS — I4891 Unspecified atrial fibrillation: Secondary | ICD-10-CM | POA: Diagnosis not present

## 2014-02-03 DIAGNOSIS — S24104A Unspecified injury at T11-T12 level of thoracic spinal cord, initial encounter: Secondary | ICD-10-CM | POA: Diagnosis not present

## 2014-02-04 DIAGNOSIS — M545 Low back pain: Secondary | ICD-10-CM | POA: Diagnosis not present

## 2014-02-04 DIAGNOSIS — S24104A Unspecified injury at T11-T12 level of thoracic spinal cord, initial encounter: Secondary | ICD-10-CM | POA: Diagnosis not present

## 2014-02-04 DIAGNOSIS — I4891 Unspecified atrial fibrillation: Secondary | ICD-10-CM | POA: Diagnosis not present

## 2014-02-05 DIAGNOSIS — M545 Low back pain: Secondary | ICD-10-CM | POA: Diagnosis not present

## 2014-02-05 DIAGNOSIS — S24104A Unspecified injury at T11-T12 level of thoracic spinal cord, initial encounter: Secondary | ICD-10-CM | POA: Diagnosis not present

## 2014-02-05 DIAGNOSIS — I4891 Unspecified atrial fibrillation: Secondary | ICD-10-CM | POA: Diagnosis not present

## 2014-02-06 DIAGNOSIS — S24104A Unspecified injury at T11-T12 level of thoracic spinal cord, initial encounter: Secondary | ICD-10-CM | POA: Diagnosis not present

## 2014-02-06 DIAGNOSIS — I4891 Unspecified atrial fibrillation: Secondary | ICD-10-CM | POA: Diagnosis not present

## 2014-02-06 DIAGNOSIS — M545 Low back pain: Secondary | ICD-10-CM | POA: Diagnosis not present

## 2014-02-07 ENCOUNTER — Ambulatory Visit (INDEPENDENT_AMBULATORY_CARE_PROVIDER_SITE_OTHER): Payer: Medicare Other | Admitting: Family Medicine

## 2014-02-07 ENCOUNTER — Encounter (INDEPENDENT_AMBULATORY_CARE_PROVIDER_SITE_OTHER): Payer: Self-pay

## 2014-02-07 VITALS — BP 129/74 | HR 87 | Temp 97.5°F | Ht 71.5 in | Wt 246.8 lb

## 2014-02-07 DIAGNOSIS — S24104A Unspecified injury at T11-T12 level of thoracic spinal cord, initial encounter: Secondary | ICD-10-CM | POA: Diagnosis not present

## 2014-02-07 DIAGNOSIS — M5136 Other intervertebral disc degeneration, lumbar region: Secondary | ICD-10-CM | POA: Diagnosis not present

## 2014-02-07 DIAGNOSIS — M545 Low back pain: Secondary | ICD-10-CM | POA: Diagnosis not present

## 2014-02-07 DIAGNOSIS — I4891 Unspecified atrial fibrillation: Secondary | ICD-10-CM | POA: Diagnosis not present

## 2014-02-07 DIAGNOSIS — Z9889 Other specified postprocedural states: Secondary | ICD-10-CM | POA: Diagnosis not present

## 2014-02-07 DIAGNOSIS — R339 Retention of urine, unspecified: Secondary | ICD-10-CM | POA: Diagnosis not present

## 2014-02-07 LAB — POCT INR: INR: 1.2

## 2014-02-07 MED ORDER — FENTANYL 100 MCG/HR TD PT72: 100.0000 ug | MEDICATED_PATCH | TRANSDERMAL | Status: DC

## 2014-02-07 MED ORDER — OXYCODONE-ACETAMINOPHEN 10-325 MG PO TABS: 1.0000 | ORAL_TABLET | Freq: Four times a day (QID) | ORAL | Status: DC | PRN

## 2014-02-07 MED ORDER — WARFARIN SODIUM 5 MG PO TABS: 5.0000 mg | ORAL_TABLET | Freq: Every day | ORAL | Status: DC

## 2014-02-07 NOTE — Progress Notes (Signed)
   Subjective:    Patient ID: Kenneth Rhodes, male    DOB: 02-Nov-1945, 68 y.o.   MRN: [id number]  HPI Patient is here for establishment visit.  He needs pain meds until he is seen at pain clinic. He is on coumadin and needs inr. He was hospitalized recently for sepsis related to MRSA he states.  He has DDD of the lumbar spine and has had to have surgeries of the LS spine 3 months ago in [place]. He is out of rehab and lives in [place]. He is on coumadin because of the surgery.  He has BPH and had difficulty with voiding so he went to see his urologist this am and had an indwelling foley cath inserted and has a bag which ua is draining to. He is out of pain meds and c/o moderate pain in his back. Review of Systems  Constitutional: Negative for fever.  HENT: Negative for ear pain.   Eyes: Negative for discharge.  Respiratory: Negative for cough.   Cardiovascular: Negative for chest pain.  Gastrointestinal: Negative for abdominal distention.  Endocrine: Negative for polyuria.  Genitourinary: Negative for difficulty urinating.  Musculoskeletal: Positive for back pain. Negative for gait problem and neck pain.  Skin: Negative for color change and rash.  Neurological: Negative for speech difficulty and headaches.  Psychiatric/Behavioral: Negative for agitation.       Objective:    BP 129/74 mmHg  Pulse 87  Temp(Src) 97.5 F (36.4 C) (Oral)  Ht 5' 11.5" (1.816 m)  Wt 246 lb 12.8 oz (111.948 kg)  BMI 33.95 kg/m2 Physical Exam  Constitutional: He is oriented to person, place, and time. He appears well-developed and well-nourished.  Patient with walker sitting in chair in NAD  HENT:  Head: Normocephalic and atraumatic.  Mouth/Throat: Oropharynx is clear and moist.  Eyes: Pupils are equal, round, and reactive to light.  Neck: Normal range of motion. Neck supple.  Cardiovascular: Normal rate and regular rhythm.   No murmur heard. Pulmonary/Chest: Effort normal and breath sounds  normal.  Abdominal: Soft. Bowel sounds are normal. There is no tenderness.  Musculoskeletal:  TTP LS spine  Neurological: He is alert and oriented to person, place, and time.  Weakness in lower extremities.  Skin: Skin is warm and dry.  Psychiatric: He has a normal mood and affect.          Assessment & Plan:     ICD-9-CM ICD-10-CM   1. DDD (degenerative disc disease), lumbar 722.52 M51.36 oxyCODONE-acetaminophen (PERCOCET) 10-325 MG per tablet     fentaNYL (DURAGESIC - DOSED MCG/HR) 100 MCG/HR     POCT INR     Ambulatory referral to Pain Clinic   2. Coumadin therapy - He is not sure why he is on coumadin and probably done for prophylaxis for his back surgery.  Recommend he follow up with clinical pharmacist. Moises Blood ordered today.  Need records.    3.  BPH - Follow up with Urology.    Return in about 1 month (around 03/09/2014).  Lysbeth Penner FNP

## 2014-02-07 NOTE — Addendum Note (Signed)
Addended by: Cherre Robins on: 02/07/2014 11:54 AM   Modules accepted: Orders

## 2014-02-07 NOTE — Patient Instructions (Signed)
Anticoagulation Dose Instructions as of 02/07/2014      Kenneth Rhodes Tue Wed Thu Fri Sat   New Dose 5 mg 7.5 mg 5 mg 7.5 mg 5 mg 7.5 mg 5 mg    Description        Take 2 tablets today - Fridays, November 20th.   Then take 1 and 1/2 on mondays, wednesdays and fridays Take 1 tablet all other days. We are verifying reason for taking warfarin and if can stop.       INR was 1.2 today (goal is 2.0 to 3.0)

## 2014-02-07 NOTE — Patient Instructions (Signed)
Anticoagulation Dose Instructions as of 02/07/2014      Dorene Grebe Tue Wed Thu Fri Sat   New Dose 5 mg 7.5 mg 5 mg 7.5 mg 5 mg 7.5 mg 5 mg    Description        Take 2 tablets today - Fridays, November 20th.   Then take 1 and 1/2 on mondays, wednesdays and fridays Take 1 tablet all other days. We are verifying reason for taking warfarin and if can stop.       INR was 1.2 today (goal is 2.0 to 3.0)

## 2014-02-10 DIAGNOSIS — M545 Low back pain: Secondary | ICD-10-CM | POA: Diagnosis not present

## 2014-02-10 DIAGNOSIS — S24104A Unspecified injury at T11-T12 level of thoracic spinal cord, initial encounter: Secondary | ICD-10-CM | POA: Diagnosis not present

## 2014-02-10 DIAGNOSIS — I4891 Unspecified atrial fibrillation: Secondary | ICD-10-CM | POA: Diagnosis not present

## 2014-02-11 DIAGNOSIS — M545 Low back pain: Secondary | ICD-10-CM | POA: Diagnosis not present

## 2014-02-11 DIAGNOSIS — I4891 Unspecified atrial fibrillation: Secondary | ICD-10-CM | POA: Diagnosis not present

## 2014-02-11 DIAGNOSIS — S24104A Unspecified injury at T11-T12 level of thoracic spinal cord, initial encounter: Secondary | ICD-10-CM | POA: Diagnosis not present

## 2014-02-12 DIAGNOSIS — S24104A Unspecified injury at T11-T12 level of thoracic spinal cord, initial encounter: Secondary | ICD-10-CM | POA: Diagnosis not present

## 2014-02-12 DIAGNOSIS — M545 Low back pain: Secondary | ICD-10-CM | POA: Diagnosis not present

## 2014-02-12 DIAGNOSIS — I4891 Unspecified atrial fibrillation: Secondary | ICD-10-CM | POA: Diagnosis not present

## 2014-02-13 DIAGNOSIS — I4891 Unspecified atrial fibrillation: Secondary | ICD-10-CM | POA: Diagnosis not present

## 2014-02-13 DIAGNOSIS — M545 Low back pain: Secondary | ICD-10-CM | POA: Diagnosis not present

## 2014-02-13 DIAGNOSIS — S24104A Unspecified injury at T11-T12 level of thoracic spinal cord, initial encounter: Secondary | ICD-10-CM | POA: Diagnosis not present

## 2014-02-14 DIAGNOSIS — S24104A Unspecified injury at T11-T12 level of thoracic spinal cord, initial encounter: Secondary | ICD-10-CM | POA: Diagnosis not present

## 2014-02-14 DIAGNOSIS — I4891 Unspecified atrial fibrillation: Secondary | ICD-10-CM | POA: Diagnosis not present

## 2014-02-14 DIAGNOSIS — M545 Low back pain: Secondary | ICD-10-CM | POA: Diagnosis not present

## 2014-02-17 DIAGNOSIS — M545 Low back pain: Secondary | ICD-10-CM | POA: Diagnosis not present

## 2014-02-17 DIAGNOSIS — I4891 Unspecified atrial fibrillation: Secondary | ICD-10-CM | POA: Diagnosis not present

## 2014-02-17 DIAGNOSIS — S24104A Unspecified injury at T11-T12 level of thoracic spinal cord, initial encounter: Secondary | ICD-10-CM | POA: Diagnosis not present

## 2014-02-18 DIAGNOSIS — R3911 Hesitancy of micturition: Secondary | ICD-10-CM | POA: Diagnosis not present

## 2014-02-18 DIAGNOSIS — R3919 Other difficulties with micturition: Secondary | ICD-10-CM | POA: Diagnosis not present

## 2014-02-18 DIAGNOSIS — R339 Retention of urine, unspecified: Secondary | ICD-10-CM | POA: Diagnosis not present

## 2014-02-19 DIAGNOSIS — M545 Low back pain: Secondary | ICD-10-CM | POA: Diagnosis not present

## 2014-02-19 DIAGNOSIS — I4891 Unspecified atrial fibrillation: Secondary | ICD-10-CM | POA: Diagnosis not present

## 2014-02-19 DIAGNOSIS — S24104A Unspecified injury at T11-T12 level of thoracic spinal cord, initial encounter: Secondary | ICD-10-CM | POA: Diagnosis not present

## 2014-02-20 ENCOUNTER — Telehealth: Payer: Self-pay | Admitting: Family Medicine

## 2014-02-20 ENCOUNTER — Other Ambulatory Visit: Payer: Self-pay | Admitting: *Deleted

## 2014-02-20 DIAGNOSIS — I4891 Unspecified atrial fibrillation: Secondary | ICD-10-CM | POA: Diagnosis not present

## 2014-02-20 DIAGNOSIS — S24104A Unspecified injury at T11-T12 level of thoracic spinal cord, initial encounter: Secondary | ICD-10-CM | POA: Diagnosis not present

## 2014-02-20 DIAGNOSIS — M545 Low back pain: Secondary | ICD-10-CM | POA: Diagnosis not present

## 2014-02-20 MED ORDER — DILTIAZEM HCL ER 240 MG PO CP24: 240.0000 mg | ORAL_CAPSULE | Freq: Every day | ORAL | Status: DC

## 2014-02-20 NOTE — Telephone Encounter (Signed)
See note for refill

## 2014-02-20 NOTE — Telephone Encounter (Signed)
Spoke with Tammy and should refill this med, pt still has Coumadin refill. Pt has copy of records and will bring by tom for Tammy to review and see if needs to continue other meds.

## 2014-02-24 DIAGNOSIS — I4891 Unspecified atrial fibrillation: Secondary | ICD-10-CM | POA: Diagnosis not present

## 2014-02-24 DIAGNOSIS — S24104A Unspecified injury at T11-T12 level of thoracic spinal cord, initial encounter: Secondary | ICD-10-CM | POA: Diagnosis not present

## 2014-02-24 DIAGNOSIS — M545 Low back pain: Secondary | ICD-10-CM | POA: Diagnosis not present

## 2014-02-25 DIAGNOSIS — M545 Low back pain: Secondary | ICD-10-CM | POA: Diagnosis not present

## 2014-02-25 DIAGNOSIS — I4891 Unspecified atrial fibrillation: Secondary | ICD-10-CM | POA: Diagnosis not present

## 2014-02-25 DIAGNOSIS — S24104A Unspecified injury at T11-T12 level of thoracic spinal cord, initial encounter: Secondary | ICD-10-CM | POA: Diagnosis not present

## 2014-02-26 DIAGNOSIS — R404 Transient alteration of awareness: Secondary | ICD-10-CM | POA: Diagnosis not present

## 2014-02-26 DIAGNOSIS — Z87891 Personal history of nicotine dependence: Secondary | ICD-10-CM | POA: Diagnosis not present

## 2014-02-26 DIAGNOSIS — M4804 Spinal stenosis, thoracic region: Secondary | ICD-10-CM | POA: Diagnosis not present

## 2014-02-26 DIAGNOSIS — R319 Hematuria, unspecified: Secondary | ICD-10-CM | POA: Diagnosis not present

## 2014-02-26 DIAGNOSIS — R531 Weakness: Secondary | ICD-10-CM | POA: Diagnosis not present

## 2014-02-26 DIAGNOSIS — M47815 Spondylosis without myelopathy or radiculopathy, thoracolumbar region: Secondary | ICD-10-CM | POA: Diagnosis not present

## 2014-02-26 DIAGNOSIS — R310 Gross hematuria: Secondary | ICD-10-CM | POA: Diagnosis not present

## 2014-02-26 DIAGNOSIS — I10 Essential (primary) hypertension: Secondary | ICD-10-CM | POA: Diagnosis not present

## 2014-02-26 DIAGNOSIS — M4806 Spinal stenosis, lumbar region: Secondary | ICD-10-CM | POA: Diagnosis not present

## 2014-02-26 DIAGNOSIS — M4800 Spinal stenosis, site unspecified: Secondary | ICD-10-CM | POA: Diagnosis not present

## 2014-02-26 DIAGNOSIS — Z8614 Personal history of Methicillin resistant Staphylococcus aureus infection: Secondary | ICD-10-CM | POA: Diagnosis not present

## 2014-02-26 DIAGNOSIS — M6281 Muscle weakness (generalized): Secondary | ICD-10-CM | POA: Diagnosis not present

## 2014-02-28 DIAGNOSIS — M4806 Spinal stenosis, lumbar region: Secondary | ICD-10-CM | POA: Diagnosis not present

## 2014-03-07 ENCOUNTER — Ambulatory Visit (INDEPENDENT_AMBULATORY_CARE_PROVIDER_SITE_OTHER): Payer: Medicare Other | Admitting: Family Medicine

## 2014-03-07 ENCOUNTER — Encounter: Payer: Self-pay | Admitting: Family Medicine

## 2014-03-07 VITALS — BP 135/86 | HR 92 | Temp 97.7°F | Ht 73.0 in | Wt 235.0 lb

## 2014-03-07 DIAGNOSIS — M5136 Other intervertebral disc degeneration, lumbar region: Secondary | ICD-10-CM | POA: Diagnosis not present

## 2014-03-07 DIAGNOSIS — K2100 Gastro-esophageal reflux disease with esophagitis, without bleeding: Secondary | ICD-10-CM

## 2014-03-07 DIAGNOSIS — R252 Cramp and spasm: Secondary | ICD-10-CM

## 2014-03-07 DIAGNOSIS — R258 Other abnormal involuntary movements: Secondary | ICD-10-CM | POA: Diagnosis not present

## 2014-03-07 MED ORDER — SUCRALFATE 1 G PO TABS: 1.0000 g | ORAL_TABLET | Freq: Four times a day (QID) | ORAL | Status: DC

## 2014-03-07 MED ORDER — OXYCODONE-ACETAMINOPHEN 10-325 MG PO TABS
1.0000 | ORAL_TABLET | Freq: Four times a day (QID) | ORAL | Status: DC | PRN
Start: 2014-03-07 — End: 2014-03-29

## 2014-03-07 MED ORDER — OMEPRAZOLE 40 MG PO CPDR: 40.0000 mg | DELAYED_RELEASE_CAPSULE | Freq: Every day | ORAL | Status: DC

## 2014-03-07 MED ORDER — BACLOFEN 10 MG PO TABS: 10.0000 mg | ORAL_TABLET | Freq: Three times a day (TID) | ORAL | Status: DC

## 2014-03-07 MED ORDER — FENTANYL 100 MCG/HR TD PT72: 100.0000 ug | MEDICATED_PATCH | TRANSDERMAL | Status: DC

## 2014-03-07 NOTE — Progress Notes (Signed)
   Subjective:    Patient ID: Kenneth Rhodes, male    DOB: 04/17/1945, 68 y.o.   MRN: [id number]  HPI He is here for follow up.  He has hx of DDD and recent back surgery.  He has GERD sx's and is needing his sucralfate refilled and is not on omeprazole.  He needs his pain medicine.  He is having lower extremity spasticity.  Review of Systems  Constitutional: Negative for fever.  HENT: Negative for ear pain.   Eyes: Negative for discharge.  Respiratory: Negative for cough.   Cardiovascular: Negative for chest pain.  Gastrointestinal: Negative for abdominal distention.  Endocrine: Negative for polyuria.  Genitourinary: Negative for difficulty urinating.  Musculoskeletal: Negative for gait problem and neck pain.  Skin: Negative for color change and rash.  Neurological: Negative for speech difficulty and headaches.  Psychiatric/Behavioral: Negative for agitation.       Objective:    BP 135/86 mmHg  Pulse 92  Temp(Src) 97.7 F (36.5 C) (Oral)  Ht 6\' 1"  (1.854 m)  Wt 235 lb (106.595 kg)  BMI 31.01 kg/m2 Physical Exam  Constitutional: He is oriented to person, place, and time. He appears well-developed and well-nourished.  HENT:  Head: Normocephalic and atraumatic.  Mouth/Throat: Oropharynx is clear and moist.  Eyes: Pupils are equal, round, and reactive to light.  Neck: Normal range of motion. Neck supple.  Cardiovascular: Normal rate and regular rhythm.   No murmur heard. Pulmonary/Chest: Effort normal and breath sounds normal.  Abdominal: Soft. Bowel sounds are normal. There is no tenderness.  Neurological: He is alert and oriented to person, place, and time.  Skin: Skin is warm and dry.  Psychiatric: He has a normal mood and affect.          Assessment & Plan:     ICD-9-CM ICD-10-CM   1. DDD (degenerative disc disease), lumbar 722.52 M51.36 oxyCODONE-acetaminophen (PERCOCET) 10-325 MG per tablet     fentaNYL (DURAGESIC - DOSED MCG/HR) 100 MCG/HR  2.  Spasticity 781.0 R25.8 baclofen (LIORESAL) 10 MG tablet  3. Gastroesophageal reflux disease with esophagitis 530.11 K21.0 sucralfate (CARAFATE) 1 G tablet     omeprazole (PRILOSEC) 40 MG capsule     No Follow-up on file.  Lysbeth Penner FNP

## 2014-03-25 DIAGNOSIS — M4806 Spinal stenosis, lumbar region: Secondary | ICD-10-CM | POA: Diagnosis not present

## 2014-03-25 DIAGNOSIS — Z6831 Body mass index (BMI) 31.0-31.9, adult: Secondary | ICD-10-CM | POA: Diagnosis not present

## 2014-03-26 ENCOUNTER — Inpatient Hospital Stay (HOSPITAL_COMMUNITY)
Admission: EM | Admit: 2014-03-26 | Discharge: 2014-03-29 | DRG: 896 | Disposition: A | Discharge status: Home / Self Care | Payer: Medicare Other | Attending: Family Medicine | Admitting: Family Medicine

## 2014-03-26 ENCOUNTER — Encounter (HOSPITAL_COMMUNITY): Payer: Self-pay | Admitting: Emergency Medicine

## 2014-03-26 DIAGNOSIS — E669 Obesity, unspecified: Secondary | ICD-10-CM | POA: Diagnosis present

## 2014-03-26 DIAGNOSIS — Z7409 Other reduced mobility: Secondary | ICD-10-CM | POA: Diagnosis present

## 2014-03-26 DIAGNOSIS — I10 Essential (primary) hypertension: Secondary | ICD-10-CM | POA: Diagnosis present

## 2014-03-26 DIAGNOSIS — F1124 Opioid dependence with opioid-induced mood disorder: Secondary | ICD-10-CM | POA: Diagnosis present

## 2014-03-26 DIAGNOSIS — Z7901 Long term (current) use of anticoagulants: Secondary | ICD-10-CM

## 2014-03-26 DIAGNOSIS — Z87891 Personal history of nicotine dependence: Secondary | ICD-10-CM | POA: Diagnosis not present

## 2014-03-26 DIAGNOSIS — F332 Major depressive disorder, recurrent severe without psychotic features: Secondary | ICD-10-CM | POA: Diagnosis present

## 2014-03-26 DIAGNOSIS — M5417 Radiculopathy, lumbosacral region: Secondary | ICD-10-CM | POA: Diagnosis present

## 2014-03-26 DIAGNOSIS — R45851 Suicidal ideations: Secondary | ICD-10-CM

## 2014-03-26 DIAGNOSIS — E785 Hyperlipidemia, unspecified: Secondary | ICD-10-CM | POA: Diagnosis present

## 2014-03-26 DIAGNOSIS — Z6831 Body mass index (BMI) 31.0-31.9, adult: Secondary | ICD-10-CM

## 2014-03-26 DIAGNOSIS — F1123 Opioid dependence with withdrawal: Secondary | ICD-10-CM | POA: Diagnosis not present

## 2014-03-26 DIAGNOSIS — G062 Extradural and subdural abscess, unspecified: Secondary | ICD-10-CM | POA: Diagnosis present

## 2014-03-26 DIAGNOSIS — N401 Enlarged prostate with lower urinary tract symptoms: Secondary | ICD-10-CM | POA: Diagnosis present

## 2014-03-26 DIAGNOSIS — F1193 Opioid use, unspecified with withdrawal: Secondary | ICD-10-CM

## 2014-03-26 DIAGNOSIS — Z96651 Presence of right artificial knee joint: Secondary | ICD-10-CM | POA: Diagnosis present

## 2014-03-26 DIAGNOSIS — Z5329 Procedure and treatment not carried out because of patient's decision for other reasons: Secondary | ICD-10-CM | POA: Diagnosis not present

## 2014-03-26 DIAGNOSIS — K219 Gastro-esophageal reflux disease without esophagitis: Secondary | ICD-10-CM | POA: Diagnosis present

## 2014-03-26 DIAGNOSIS — N3289 Other specified disorders of bladder: Secondary | ICD-10-CM | POA: Diagnosis present

## 2014-03-26 DIAGNOSIS — M5416 Radiculopathy, lumbar region: Secondary | ICD-10-CM

## 2014-03-26 DIAGNOSIS — R339 Retention of urine, unspecified: Secondary | ICD-10-CM | POA: Diagnosis present

## 2014-03-26 DIAGNOSIS — M545 Low back pain: Secondary | ICD-10-CM | POA: Diagnosis present

## 2014-03-26 DIAGNOSIS — F339 Major depressive disorder, recurrent, unspecified: Secondary | ICD-10-CM

## 2014-03-26 DIAGNOSIS — Z885 Allergy status to narcotic agent status: Secondary | ICD-10-CM | POA: Diagnosis not present

## 2014-03-26 DIAGNOSIS — E039 Hypothyroidism, unspecified: Secondary | ICD-10-CM

## 2014-03-26 DIAGNOSIS — G8929 Other chronic pain: Secondary | ICD-10-CM | POA: Diagnosis present

## 2014-03-26 DIAGNOSIS — Z8614 Personal history of Methicillin resistant Staphylococcus aureus infection: Secondary | ICD-10-CM | POA: Diagnosis not present

## 2014-03-26 DIAGNOSIS — N400 Enlarged prostate without lower urinary tract symptoms: Secondary | ICD-10-CM

## 2014-03-26 DIAGNOSIS — F1120 Opioid dependence, uncomplicated: Secondary | ICD-10-CM | POA: Diagnosis present

## 2014-03-26 DIAGNOSIS — Z888 Allergy status to other drugs, medicaments and biological substances status: Secondary | ICD-10-CM

## 2014-03-26 DIAGNOSIS — F331 Major depressive disorder, recurrent, moderate: Secondary | ICD-10-CM

## 2014-03-26 HISTORY — DX: Essential (primary) hypertension: I10

## 2014-03-26 HISTORY — DX: Hypothyroidism, unspecified: E03.9

## 2014-03-26 HISTORY — DX: Carrier or suspected carrier of methicillin resistant Staphylococcus aureus: Z22.322

## 2014-03-26 LAB — BASIC METABOLIC PANEL
Anion gap: 5 (ref 5–15)
BUN: 12 mg/dL (ref 6–23)
CHLORIDE: 106 meq/L (ref 96–112)
CO2: 28 mmol/L (ref 19–32)
Calcium: 9.5 mg/dL (ref 8.4–10.5)
Creatinine, Ser: 0.95 mg/dL (ref 0.50–1.35)
GFR calc Af Amer: >90 mL/min (ref >90)
GFR calc non Af Amer: 85 mL/min — ABNORMAL LOW (ref >90)
GLUCOSE: 102 mg/dL — AB (ref 70–99)
POTASSIUM: 4.3 mmol/L (ref 3.5–5.1)
Sodium: 139 mmol/L (ref 135–145)

## 2014-03-26 LAB — CBC WITH DIFFERENTIAL/PLATELET
Basophils Absolute: 0 10*3/uL (ref 0.0–0.1)
Basophils Relative: 1 % (ref 0–1)
Eosinophils Absolute: 0 10*3/uL (ref 0.0–0.7)
Eosinophils Relative: 0 % (ref 0–5)
HEMATOCRIT: 36.2 % — AB (ref 39.0–52.0)
HEMOGLOBIN: 11.9 g/dL — AB (ref 13.0–17.0)
LYMPHS ABS: 1.1 10*3/uL (ref 0.7–4.0)
LYMPHS PCT: 24 % (ref 12–46)
MCH: 26.7 pg (ref 26.0–34.0)
MCHC: 32.9 g/dL (ref 30.0–36.0)
MCV: 81.2 fL (ref 78.0–100.0)
MONO ABS: 0.4 10*3/uL (ref 0.1–1.0)
MONOS PCT: 9 % (ref 3–12)
NEUTROS ABS: 3 10*3/uL (ref 1.7–7.7)
Neutrophils Relative %: 66 % (ref 43–77)
Platelets: 216 10*3/uL (ref 150–400)
RBC: 4.46 MIL/uL (ref 4.22–5.81)
RDW: 15.3 % (ref 11.5–15.5)
WBC: 4.6 10*3/uL (ref 4.0–10.5)

## 2014-03-26 LAB — APTT: aPTT: 49 seconds — ABNORMAL HIGH (ref 24–37)

## 2014-03-26 LAB — URINALYSIS, ROUTINE W REFLEX MICROSCOPIC
BILIRUBIN URINE: NEGATIVE
Glucose, UA: NEGATIVE mg/dL
Hgb urine dipstick: NEGATIVE
KETONES UR: NEGATIVE mg/dL
Leukocytes, UA: NEGATIVE
NITRITE: NEGATIVE
PROTEIN: NEGATIVE mg/dL
SPECIFIC GRAVITY, URINE: 1.02 (ref 1.005–1.030)
UROBILINOGEN UA: 0.2 mg/dL (ref 0.0–1.0)
pH: 7 (ref 5.0–8.0)

## 2014-03-26 LAB — RAPID URINE DRUG SCREEN, HOSP PERFORMED
AMPHETAMINES: NOT DETECTED
BARBITURATES: NOT DETECTED
BENZODIAZEPINES: NOT DETECTED
COCAINE: NOT DETECTED
OPIATES: POSITIVE — AB
Tetrahydrocannabinol: NOT DETECTED

## 2014-03-26 LAB — PROTIME-INR
INR: 2.82 — AB (ref 0.00–1.49)
PROTHROMBIN TIME: 29.9 s — AB (ref 11.6–15.2)

## 2014-03-26 LAB — ETHANOL: Alcohol, Ethyl (B): <5 mg/dL (ref 0–9)

## 2014-03-26 MED ORDER — PANTOPRAZOLE SODIUM 40 MG PO TBEC
80.0000 mg | DELAYED_RELEASE_TABLET | Freq: Every day | ORAL | Status: DC
  Administered 2014-03-27 – 2014-03-29 (×3): 80 mg via ORAL
  Filled 2014-03-26 (×3): qty 2

## 2014-03-26 MED ORDER — CLONIDINE HCL 0.1 MG PO TABS
0.1000 mg | ORAL_TABLET | Freq: Four times a day (QID) | ORAL | Status: AC
  Administered 2014-03-26 – 2014-03-28 (×7): 0.1 mg via ORAL
  Filled 2014-03-26 (×7): qty 1

## 2014-03-26 MED ORDER — METHOCARBAMOL 500 MG PO TABS
500.0000 mg | ORAL_TABLET | Freq: Three times a day (TID) | ORAL | Status: DC | PRN
  Administered 2014-03-26 – 2014-03-27 (×4): 500 mg via ORAL
  Filled 2014-03-26 (×4): qty 1

## 2014-03-26 MED ORDER — OXYCODONE-ACETAMINOPHEN 5-325 MG PO TABS
1.0000 | ORAL_TABLET | Freq: Three times a day (TID) | ORAL | Status: DC | PRN
  Administered 2014-03-26 – 2014-03-27 (×2): 1 via ORAL
  Filled 2014-03-26 (×2): qty 1

## 2014-03-26 MED ORDER — SUCRALFATE 1 G PO TABS
1.0000 g | ORAL_TABLET | Freq: Four times a day (QID) | ORAL | Status: DC
  Administered 2014-03-26 – 2014-03-29 (×12): 1 g via ORAL
  Filled 2014-03-26 (×20): qty 1

## 2014-03-26 MED ORDER — DILTIAZEM HCL ER COATED BEADS 240 MG PO CP24
240.0000 mg | ORAL_CAPSULE | Freq: Every day | ORAL | Status: DC
  Administered 2014-03-26: 240 mg via ORAL
  Filled 2014-03-26 (×5): qty 1

## 2014-03-26 MED ORDER — LOPERAMIDE HCL 2 MG PO CAPS
2.0000 mg | ORAL_CAPSULE | ORAL | Status: DC | PRN
  Administered 2014-03-28: 4 mg via ORAL
  Filled 2014-03-26: qty 2

## 2014-03-26 MED ORDER — WARFARIN - PHARMACIST DOSING INPATIENT
Status: DC
  Administered 2014-03-27: 15:00:00

## 2014-03-26 MED ORDER — NAPROXEN 250 MG PO TABS
500.0000 mg | ORAL_TABLET | Freq: Two times a day (BID) | ORAL | Status: DC | PRN
  Administered 2014-03-27 – 2014-03-28 (×3): 500 mg via ORAL
  Filled 2014-03-26 (×3): qty 2

## 2014-03-26 MED ORDER — TAMSULOSIN HCL 0.4 MG PO CAPS
0.4000 mg | ORAL_CAPSULE | Freq: Every day | ORAL | Status: DC
  Administered 2014-03-26 – 2014-03-29 (×4): 0.4 mg via ORAL
  Filled 2014-03-26 (×4): qty 1

## 2014-03-26 MED ORDER — DICYCLOMINE HCL 10 MG PO CAPS: 20.0000 mg | ORAL_CAPSULE | Freq: Four times a day (QID) | ORAL | Status: DC | PRN

## 2014-03-26 MED ORDER — SODIUM CHLORIDE 0.9 % IJ SOLN
3.0000 mL | Freq: Two times a day (BID) | INTRAMUSCULAR | Status: DC
  Administered 2014-03-29: 3 mL via INTRAVENOUS

## 2014-03-26 MED ORDER — CLONIDINE HCL 0.1 MG PO TABS: 0.1000 mg | ORAL_TABLET | Freq: Every day | ORAL | Status: DC

## 2014-03-26 MED ORDER — PANTOPRAZOLE SODIUM 40 MG PO TBEC: 80.0000 mg | DELAYED_RELEASE_TABLET | Freq: Every day | ORAL | Status: DC

## 2014-03-26 MED ORDER — CLONIDINE HCL 0.1 MG PO TABS
0.1000 mg | ORAL_TABLET | ORAL | Status: DC
  Administered 2014-03-28 – 2014-03-29 (×2): 0.1 mg via ORAL
  Filled 2014-03-26 (×2): qty 1

## 2014-03-26 MED ORDER — WARFARIN SODIUM 2 MG PO TABS
2.0000 mg | ORAL_TABLET | Freq: Once | ORAL | Status: AC
  Administered 2014-03-26: 2 mg via ORAL
  Filled 2014-03-26: qty 1

## 2014-03-26 MED ORDER — SERTRALINE HCL 50 MG PO TABS
200.0000 mg | ORAL_TABLET | Freq: Every day | ORAL | Status: DC
  Administered 2014-03-26 – 2014-03-29 (×4): 200 mg via ORAL
  Filled 2014-03-26 (×7): qty 4

## 2014-03-26 MED ORDER — DIAZEPAM 2 MG PO TABS
2.0000 mg | ORAL_TABLET | Freq: Three times a day (TID) | ORAL | Status: DC | PRN
  Administered 2014-03-26 – 2014-03-27 (×3): 2 mg via ORAL
  Filled 2014-03-26 (×3): qty 1

## 2014-03-26 MED ORDER — SODIUM CHLORIDE 0.9 % IV SOLN
INTRAVENOUS | Status: DC
Start: 2014-03-26 — End: 2014-03-29
  Administered 2014-03-26 – 2014-03-29 (×5): via INTRAVENOUS

## 2014-03-26 MED ORDER — ONDANSETRON 4 MG PO TBDP: 4.0000 mg | ORAL_TABLET | Freq: Four times a day (QID) | ORAL | Status: DC | PRN

## 2014-03-26 MED ORDER — LEVOTHYROXINE SODIUM 75 MCG PO TABS
150.0000 ug | ORAL_TABLET | Freq: Every day | ORAL | Status: DC
  Administered 2014-03-27 – 2014-03-29 (×3): 150 ug via ORAL
  Filled 2014-03-26 (×3): qty 2

## 2014-03-26 MED ORDER — LEVOTHYROXINE SODIUM 75 MCG PO TABS: 150.0000 ug | ORAL_TABLET | Freq: Every day | ORAL | Status: DC

## 2014-03-26 MED ORDER — ENSURE COMPLETE PO LIQD
237.0000 mL | Freq: Two times a day (BID) | ORAL | Status: DC
  Administered 2014-03-27 – 2014-03-29 (×5): 237 mL via ORAL

## 2014-03-26 MED ORDER — HYDROXYZINE HCL 25 MG PO TABS
25.0000 mg | ORAL_TABLET | Freq: Four times a day (QID) | ORAL | Status: DC | PRN
  Administered 2014-03-27: 25 mg via ORAL
  Filled 2014-03-26: qty 1

## 2014-03-26 MED ORDER — TRAMADOL HCL 50 MG PO TABS
100.0000 mg | ORAL_TABLET | Freq: Four times a day (QID) | ORAL | Status: DC
  Administered 2014-03-26 – 2014-03-27 (×4): 100 mg via ORAL
  Filled 2014-03-26 (×4): qty 2

## 2014-03-26 NOTE — BH Assessment (Signed)
Per Clarene Critchley TTS ED Tele-psych cart is in use for another TTS assessment that was called in prior. Per Clarene Critchley she will check on the medical floor for another available TTS cart if available.   [name], MS, [place] Assessment Counselor

## 2014-03-26 NOTE — BH Assessment (Signed)
Still waiting for telepsych machine to connect.   [name], MS, [place] Assessment Counselor

## 2014-03-26 NOTE — BH Assessment (Signed)
TTS in progress as machine was just set-up and connected.   [name], MS, [place] Assessment Counselor

## 2014-03-26 NOTE — BH Assessment (Signed)
Spoke with Kenneth Rhodes whom is diligently working to set up tele-psych cart but is having difficulty with connection at this time.   [name], MS, [name] Assessment Counselor

## 2014-03-26 NOTE — Progress Notes (Signed)
Referral faxed to following facilities:  [facility]  Will continue to seek placement.  Peri Maris, [place] 03/26/2014 3:03 PM

## 2014-03-26 NOTE — H&P (Addendum)
[facility] History and Physical  [name] Dory WUJ:[id number] DOB: 10-31-1945 DOA: 03/26/2014  Referring physician: Dr. Lacinda Axon - [facility] PCP: Lysbeth Penner, FNP   Chief Complaint: opiate detox  HPI: Kenneth Rhodes is a 69 y.o. male  Pt presenting trying to detox from opioids. States he [place] in [place] in [place] and got hooked on opioids. Spent several months in rehab for MSK/nerve issues. Pt has been at home for past 2 mo and taking increasing amounts of opioids and becoming increasingly depressed. Pt endorses taking up to 20mg  Oxycodone every 4 hours and 100mg  Fentanyl patch daily and chewing it after taking it off his skin. His supply has been running low lately so he's been bying more off the street and recently started using Heroine. Reports using up to 1 gram of heroine when oral narcotics not available. Pt feels like he's going in to withdrawal - diaphoresis, spasms, tremors. Denies CP, palpitations, syncope. Reports thoughts of suicide and hopelessness but no plan to carry out thoughts of suicide. Denies HI.   Review of Systems:  Constitutional:  No weight loss,   HEENT:  No Difficulty swallowing,Tooth/dental problems,Sore throat,  No sneezing, itching, ear ache, nasal congestion, post nasal drip,  Cardio-vascular:  No chest pain, Orthopnea, PND, swelling in lower extremities, anasarca, palpitations  GI:  No heartburn, indigestion, abdominal pain, nausea, vomiting, diarrhea, change in bowel habits, loss of appetite  Resp:  No shortness of breath with exertion or at rest. No excess mucus, no productive cough, No non-productive cough, No coughing up of blood.No change in color of mucus.No wheezing.No chest wall deformity  Skin:  no rash or lesions.  GU:  no dysuria, change in color of urine, no urgency or frequency. No flank pain.  Musculoskeletal:  No joint pain or swelling. No decreased range of motion. No back pain.  Psych:  Per HPI    Past  Medical History  Diagnosis Date  . Hyperlipidemia   . MRSA (methicillin resistant staph aureus) culture positive   . Hypertension   . Hypothyroidism    Past Surgical History  Procedure Laterality Date  . Spine surgery    . Joint replacement Right     knee  . Lumbar epidural injection     Social History:  reports that he quit smoking about 20 years ago. He does not have any smokeless tobacco history on file. He reports that he uses illicit drugs. He reports that he does not drink alcohol.  Allergies  Allergen Reactions  . Hydrocodone Nausea Only    History reviewed. No pertinent family history.   Prior to Admission medications   Medication Sig Start Date End Date Taking? Authorizing Provider  baclofen (LIORESAL) 10 MG tablet Take 1 tablet (10 mg total) by mouth 3 (three) times daily. 03/07/14  Yes Lysbeth Penner, FNP  diltiazem (DILACOR XR) 240 MG 24 hr capsule Take 1 capsule (240 mg total) by mouth daily. 02/20/14  Yes Lysbeth Penner, FNP  fentaNYL (DURAGESIC - DOSED MCG/HR) 100 MCG/HR Place 1 patch (100 mcg total) onto the skin every 3 (three) days. 03/07/14  Yes Lysbeth Penner, FNP  levothyroxine (SYNTHROID, LEVOTHROID) 150 MCG tablet Take 150 mcg by mouth daily. 01/31/14  Yes Historical Provider, MD  omeprazole (PRILOSEC) 40 MG capsule Take 1 capsule (40 mg total) by mouth daily. 03/07/14  Yes Lysbeth Penner, FNP  oxybutynin (DITROPAN) 5 MG tablet Take 10 mg by mouth at bedtime. 01/31/14  Yes Historical Provider,  MD  oxyCODONE-acetaminophen (PERCOCET) 10-325 MG per tablet Take 1 tablet by mouth every 6 (six) hours as needed. Patient taking differently: Take 1 tablet by mouth 3 (three) times daily as needed for pain.  03/07/14  Yes Lysbeth Penner, FNP  sertraline (ZOLOFT) 100 MG tablet Take 200 mg by mouth daily.  02/28/14  Yes Historical Provider, MD  sucralfate (CARAFATE) 1 G tablet Take 1 tablet (1 g total) by mouth 4 (four) times daily. 03/07/14  Yes Lysbeth Penner,  FNP  tamsulosin (FLOMAX) 0.4 MG CAPS capsule Take 0.4 mg by mouth daily after supper.  01/31/14  Yes Historical Provider, MD  warfarin (COUMADIN) 5 MG tablet Take 1-2 tablets (5-10 mg total) by mouth daily at 6 PM. Patient taking differently: Take 2.5-5 mg by mouth See admin instructions. 5 mg Monday-Friday.  2.5 mg on Saturday and sunday 02/07/14  Yes Tammy Eckard, [place]   Physical Exam: Filed Vitals:   03/26/14 1129 03/26/14 1521 03/26/14 1521  BP: 147/83 113/71 113/71  Pulse: 66  62  Temp: 98.5 F (36.9 C)  98.2 F (36.8 C)  TempSrc: Oral  Oral  Resp: 22  18  Height: 6\' 1"  (1.854 m)    Weight: 106.595 kg (235 lb)    SpO2: 100%  99%    Wt Readings from Last 3 Encounters:  03/26/14 106.595 kg (235 lb)  03/07/14 106.595 kg (235 lb)  02/07/14 111.948 kg (246 lb 12.8 oz)    General:  Appears somnolent, calm and comfortable Eyes:  PERRL, normal lids, irises & conjunctiva ENT:  grossly normal hearing, lips & tongue Neck:  no LAD, masses or thyromegaly Cardiovascular:  RRR, no m/r/g. No LE edema. Telemetry:  SR, no arrhythmias  Respiratory:  CTA bilaterally, no w/r/r. Normal respiratory effort. Abdomen:  soft, ntnd Skin:  no rash or induration seen on limited exam Musculoskeletal:  grossly normal tone BUE/BLE Psychiatric: Sad countenance, SI but no HI Neurologic:  grossly non-focal.          Labs on Admission:  Basic Metabolic Panel:  Recent Labs Lab 03/26/14 1228  NA 139  K 4.3  CL 106  CO2 28  GLUCOSE 102*  BUN 12  CREATININE 0.95  CALCIUM 9.5   Liver Function Tests: No results for input(s): AST, ALT, ALKPHOS, BILITOT, PROT, ALBUMIN in the last 168 hours. No results for input(s): LIPASE, AMYLASE in the last 168 hours. No results for input(s): AMMONIA in the last 168 hours. CBC:  Recent Labs Lab 03/26/14 1228  WBC 4.6  NEUTROABS 3.0  HGB 11.9*  HCT 36.2*  MCV 81.2  PLT 216   Cardiac Enzymes: No results for input(s): CKTOTAL, CKMB, CKMBINDEX,  TROPONINI in the last 168 hours.  BNP (last 3 results) No results for input(s): PROBNP in the last 8760 hours. CBG: No results for input(s): GLUCAP in the last 168 hours.  Radiological Exams on Admission: No results found.  EKG: ordered  Assessment/Plan Principal Problem:   Opiate withdrawal Active Problems:   Opiate addiction   Depression, major, recurrent   Suicidal ideation   Chronic pain   Lumbar radiculopathy, chronic   Hypothyroidism   BPH (benign prostatic hyperplasia)   Bladder spasm  69 yo M presenting w/ PMH of Depression, chronic pain, lumbar radiculopathy after spinal surgery from spinal MRSA infection??, BPH, and hypothyroidism, presenting w/ current SI and significant prescription and heroine opioid use going through withdrawal    Opiate withdrawal: extensive opiate use recently including 20mg  Oycodone Q4, Heroine,  and Fentanyl patches ( on skin adn chewing them). Pt w/ significant addiction since spinal surgery in August.  - [place] - counseling - Continue opioid withdrawal order set - clonidine, Zofran, bentyl, imodium, hydroxyzine, robaxin - Start Valium 2mg  Q8 PRN - percocet 5/325 1 tab Q8 PRN - Add Tramadol Q6 for NMDA antagonist (consider keeping on as outpt medication)  Suicidal: pt suicidal w/o plan. Dr. Lacinda Axon in ED spoke to [place] and told that pt not a candidate as he is w/o a plan at this time. Given pts severe depression and opioid use, would favor transfer to [facility] if possible.  - Psych consult in the am - sitter at bedside - Continue home Zoloft  HTN: normotensive - continue dilt,   Urinary retention: h/o BPH compounded by possible neurogenic bladder. Pt w/ worsening retention off and on since spinal surgery. Spent 3 mo w/ indwelling catheter. 300cc in ED and unable to void.  - Bladder scan if unable to void and place foley if >300cc PVR.  - hold home oxybutinin - continue flomax  Lumbar radiculopathy: chronic since spinal MRSA  infection and surgery in August. Permenant R leg numbness and intermittent weakness - PT/OT - Consider Gabapentin after opioid wean.  Hypothyroid:  - continue synthroid  GERD:  - continue PPI  Chronic anticoagulation: Pt states he was put on warfarin at time of spinal surgery due to immobility. No h/o DVT or PE per pt. Unsure of pts history.  - Continue Warfarin at this time but pt should consider DC per outpt team if mobility has returned  - Coags   Code Status: FULL DVT Prophylaxis: Warfarin Family Communication: none Disposition Plan: pending   Valdemar Mcclenahan, [facility] [email or web address] Password [email or web address]

## 2014-03-26 NOTE — ED Notes (Signed)
Patient still unable to urinate at this time. Reports history of BPH and will notify staff when able to urinate.

## 2014-03-26 NOTE — ED Notes (Addendum)
Patient states "I am in withdrawal from my medication." Patient states he is on oxycodone and fentanyl patches. States he has been on pain medications since 2008. States last fentanyl patch was removed yesterday and he took his last oxycodone this morning. Complaining of sweating, tremors, and feelings of "not wanting to live." States his PCP was going to call his medication in last night but the pharmacy refused to fill the medication because his medication was filled two weeks ago.

## 2014-03-26 NOTE — BH Assessment (Signed)
This Probation officer faxed referral information to the following facilities for bed placement.  [place], [place], [facility] Assessment Counselor

## 2014-03-26 NOTE — BH Assessment (Signed)
Consulted with Lauren  CSW who will refer patient to other hospitals.  [name], MS, [place] Assessment Counselor

## 2014-03-26 NOTE — ED Notes (Signed)
Spoke with Dr. Lacinda Axon who reports patient is to be re-evaluated in the morning by TTS to determine need for placement. Patient updated with plan of care.

## 2014-03-26 NOTE — ED Provider Notes (Signed)
CSN: [id number]     Arrival date & time 03/26/14  1125 History   First MD Initiated Contact with Patient 03/26/14 1206     Chief Complaint  Patient presents with  . V70.1     (Consider location/radiation/quality/duration/timing/severity/associated sxs/prior Treatment) HPI.... Patient presents with a chief complaint of opiate abuse and depression. He takes multiple oxycodone 10 mg per day (sometimes greater than 10 per day), fentanyl patch every 1-1.5 days.  He blames his opiate addiction on complications from his surgery related to "MRSA and in his spinal cord" in August 2015. He injected heroin earlier today. He is suicidal but has no plan. Review of systems positive for right leg numbness which is not new.  Past Medical History  Diagnosis Date  . Hyperlipidemia   . MRSA (methicillin resistant staph aureus) culture positive   . Hypertension   . Hypothyroidism    Past Surgical History  Procedure Laterality Date  . Spine surgery    . Joint replacement Right     knee  . Lumbar epidural injection     History reviewed. No pertinent family history. History  Substance Use Topics  . Smoking status: Former Smoker    Quit date: 03/21/1994  . Smokeless tobacco: Not on file  . Alcohol Use: No    Review of Systems  All other systems reviewed and are negative.     Allergies  Hydrocodone  Home Medications   Prior to Admission medications   Medication Sig Start Date End Date Taking? Authorizing Provider  baclofen (LIORESAL) 10 MG tablet Take 1 tablet (10 mg total) by mouth 3 (three) times daily. 03/07/14  Yes Lysbeth Penner, FNP  diltiazem (DILACOR XR) 240 MG 24 hr capsule Take 1 capsule (240 mg total) by mouth daily. 02/20/14  Yes Lysbeth Penner, FNP  fentaNYL (DURAGESIC - DOSED MCG/HR) 100 MCG/HR Place 1 patch (100 mcg total) onto the skin every 3 (three) days. 03/07/14  Yes Lysbeth Penner, FNP  levothyroxine (SYNTHROID, LEVOTHROID) 150 MCG tablet Take 150 mcg by mouth  daily. 01/31/14  Yes Historical Provider, MD  omeprazole (PRILOSEC) 40 MG capsule Take 1 capsule (40 mg total) by mouth daily. 03/07/14  Yes Lysbeth Penner, FNP  oxybutynin (DITROPAN) 5 MG tablet Take 10 mg by mouth at bedtime. 01/31/14  Yes Historical Provider, MD  oxyCODONE-acetaminophen (PERCOCET) 10-325 MG per tablet Take 1 tablet by mouth every 6 (six) hours as needed. Patient taking differently: Take 1 tablet by mouth 3 (three) times daily as needed for pain.  03/07/14  Yes Lysbeth Penner, FNP  sertraline (ZOLOFT) 100 MG tablet Take 200 mg by mouth daily.  02/28/14  Yes Historical Provider, MD  sucralfate (CARAFATE) 1 G tablet Take 1 tablet (1 g total) by mouth 4 (four) times daily. 03/07/14  Yes Lysbeth Penner, FNP  tamsulosin (FLOMAX) 0.4 MG CAPS capsule Take 0.4 mg by mouth daily after supper.  01/31/14  Yes Historical Provider, MD  warfarin (COUMADIN) 5 MG tablet Take 1-2 tablets (5-10 mg total) by mouth daily at 6 PM. Patient taking differently: Take 2.5-5 mg by mouth See admin instructions. 5 mg Monday-Friday.  2.5 mg on Saturday and sunday 02/07/14  Yes Tammy Eckard, PHARMD   BP 113/71 mmHg  Pulse 62  Temp(Src) 98.2 F (36.8 C) (Oral)  Resp 18  Ht 6\' 1"  (1.854 m)  Wt 235 lb (106.595 kg)  BMI 31.01 kg/m2  SpO2 99% Physical Exam  Constitutional: He is oriented to person, place,  and time. He appears well-developed and well-nourished.  HENT:  Head: Normocephalic and atraumatic.  Eyes: Conjunctivae and EOM are normal. Pupils are equal, round, and reactive to light.  Neck: Normal range of motion. Neck supple.  Cardiovascular: Normal rate and regular rhythm.   Pulmonary/Chest: Effort normal and breath sounds normal.  Abdominal: Soft. Bowel sounds are normal.  Musculoskeletal: Normal range of motion.  Neurological: He is alert and oriented to person, place, and time.  Pain with straight leg raise right greater than left  Skin: Skin is warm and dry.  Psychiatric:  Flat  affect, depressed  Nursing note and vitals reviewed.   ED Course  Procedures (including critical care time) Labs Review Labs Reviewed  BASIC METABOLIC PANEL - Abnormal; Notable for the following:    Glucose, Bld 102 (*)    GFR calc non Af Amer 85 (*)    All other components within normal limits  CBC WITH DIFFERENTIAL - Abnormal; Notable for the following:    Hemoglobin 11.9 (*)    HCT 36.2 (*)    All other components within normal limits  ETHANOL  URINE RAPID DRUG SCREEN (HOSP PERFORMED)    Imaging Review No results found.   EKG Interpretation None      MDM   Final diagnoses:  Opioid dependence with opioid-induced mood disorder    History and physical consistent with opiate addiction. Will start clonidine withdrawal program. Admit to general medicine. Patient is hemodynamically stable    Nat Christen, MD 03/26/14 1600

## 2014-03-26 NOTE — BH Assessment (Signed)
Spoke with ED nursing secretary Clarene Critchley whom is agreeable to set up tele-psych cart for TTS assessment to begin.  [name], MS, [place] Assessment Counselor

## 2014-03-26 NOTE — ED Notes (Signed)
Patient aware of need for urine specimen.

## 2014-03-26 NOTE — ED Notes (Signed)
Patient reports abuse of narcotics (oxycodone) and heroin. Last use with both was this morning. Patient reports suicidal thoughts but denies plan. No obvious attempts to suicide to patients skin.

## 2014-03-26 NOTE — ED Notes (Signed)
Hospitalist at bedside 

## 2014-03-26 NOTE — ED Notes (Signed)
Patient would like something for pain; spoke with admitting MD who is placing orders at this time. Patient updated.

## 2014-03-26 NOTE — BH Assessment (Signed)
Pt declined at [facility] as he does not meet the age requirement.  [name], MS, [place] Assessment Counselor

## 2014-03-26 NOTE — BH Assessment (Addendum)
Tele Assessment Note   Kenneth Rhodes is an 69 y.o. male. Pt presents to [facility] with C/O medical clearance due to withdraw from his pain medications. Pt reports that he recently ran out of his pain medications and won't be able to get them filled for another 2 weeks. Pt reports issues with chronic back and knee pain. Pt reports that he had an epidural in his spine in August of 2015 and developed MRSA and had to have emergency surgery that left him paralyzed from the waist down for 5 weeks. Pt reports that he was participating in physical therapy and eventually started to get sensation and feeling in his body again. Pt reports increased depressed and feels like he is a burden to his family. Pt endorses SI with no plan. Pt feels that he is no good to his family due to his addiction. Pt reports that he borrows money from his family to buy pain pills. Pt reports chronic abuse of his Fentanyl and Oxycodone prescribed medication for pain over the past 2 months. Pt denies use of abusing any other substances. Pt reports excessive weight loss over the past 6 months and decreased sleep and appetite. Pt denies HI and no AVH reported.  Consulted with EDP Dr.Cook and Ronnette Juniper whom are recommending that patient remain in ER overnight and be revaluated in the morning. Kenneth Rhodes is recommending that TTS attempt to seek placement for patient at an inpatient facility that can address his pain pills addiction,Depression and SI.  Axis I: Substance Induced Mood Disorder Axis II: Deferred Axis III:  Past Medical History  Diagnosis Date  . Hyperlipidemia   . MRSA (methicillin resistant staph aureus) culture positive   . Hypertension   . Hypothyroidism    Axis IV: economic problems, other psychosocial or environmental problems and problems with primary support group Axis V: 21-30 behavior considerably influenced by delusions or hallucinations OR serious impairment in judgment, communication OR inability to  function in almost all areas  Past Medical History:  Past Medical History  Diagnosis Date  . Hyperlipidemia   . MRSA (methicillin resistant staph aureus) culture positive   . Hypertension   . Hypothyroidism     Past Surgical History  Procedure Laterality Date  . Spine surgery    . Joint replacement Right     knee  . Lumbar epidural injection      Family History: History reviewed. No pertinent family history.  Social History:  reports that he quit smoking about 20 years ago. He does not have any smokeless tobacco history on file. He reports that he uses illicit drugs. He reports that he does not drink alcohol.  Additional Social History:  Alcohol / Drug Use History of alcohol / drug use?: No history of alcohol / drug abuse  CIWA: CIWA-Ar BP: 147/83 mmHg Pulse Rate: 66 COWS:    PATIENT STRENGTHS: (choose at least two) Ability for insight Average or above average intelligence Capable of independent living  Allergies:  Allergies  Allergen Reactions  . Hydrocodone Nausea Only    Home Medications:  (Not in a hospital admission)  OB/GYN Status:  No LMP for male patient.  General Assessment Data Location of Assessment: [facility] Is this a Tele or Face-to-Face Assessment?: Tele Assessment Is this an Initial Assessment or a Re-assessment for this encounter?: Initial Assessment Living Arrangements: Alone Can pt return to current living arrangement?: Yes Admission Status: Voluntary Is patient capable of signing voluntary admission?: Yes Transfer from: Home Referral Source: MD  [facility] Crisis Care Plan Living Arrangements: Alone Name of Psychiatrist: No Current Provider Name of Therapist: No Current Provider     Risk to self with the past 6 months Suicidal Ideation: Yes-Currently Present Suicidal Intent: Yes-Currently Present Is patient at risk for suicide?: Yes Suicidal Plan?: No Access to Means: No What has been your use of drugs/alcohol within the last 12  months?: none reported Previous Attempts/Gestures: No How many times?: 0 Other Self Harm Risks: none reported Triggers for Past Attempts: None known Intentional Self Injurious Behavior: None Family Suicide History: No Recent stressful life event(s): Conflict (Comment), Financial Problems, Recent negative physical changes (son being sent prison tommorow for drug related charges.) Persecutory voices/beliefs?: No Depression: Yes Depression Symptoms: Feeling worthless/self pity, Feeling angry/irritable Substance abuse history and/or treatment for substance abuse?: Yes Suicide prevention information given to non-admitted patients: Not applicable  Risk to Others within the past 6 months Homicidal Ideation: No Thoughts of Harm to Others: No Current Homicidal Intent: No Current Homicidal Plan: No Access to Homicidal Means: No Identified Victim: na History of harm to others?: No Assessment of Violence: None Noted Violent Behavior Description: None Reported Does patient have access to weapons?: No Criminal Charges Pending?: No Does patient have a court date: No  Psychosis Hallucinations: None noted Delusions: None noted  Mental Status Report Appear/Hygiene: In scrubs Eye Contact: Poor Motor Activity: Freedom of movement Speech: Logical/coherent Level of Consciousness: Alert Mood: Depressed Affect: Depressed Anxiety Level: Minimal Thought Processes: Coherent, Relevant Judgement: Impaired Orientation: Person, Place, Time, Situation Obsessive Compulsive Thoughts/Behaviors: None  Cognitive Functioning Concentration: Normal Memory: Recent Intact, Remote Intact IQ: Average Insight: Fair Impulse Control: Fair Appetite: Poor Weight Loss: 60 (loss 60lbs in past 6 months.) Weight Gain: 0 Sleep: Decreased Total Hours of Sleep: 4 Vegetative Symptoms: Not bathing (decreased bathing over past 3 days.)  ADLScreening [facility] Assessment Services) Patient's cognitive ability adequate to  safely complete daily activities?: Yes Patient able to express need for assistance with ADLs?: Yes Independently performs ADLs?: Yes (appropriate for developmental age)  Prior Inpatient Therapy Prior Inpatient Therapy: No Prior Therapy Dates: na Prior Therapy Facilty/Provider(s): na Reason for Treatment: na  Prior Outpatient Therapy Prior Outpatient Therapy: Yes Prior Therapy Dates: 6-38yrs ago Prior Therapy Facilty/Provider(s): pt can't remember the name Reason for Treatment: Stipulation of Pain Clinc Management  ADL Screening (condition at time of admission) Patient's cognitive ability adequate to safely complete daily activities?: Yes Is the patient deaf or have difficulty hearing?: No Does the patient have difficulty seeing, even when wearing glasses/contacts?: No Does the patient have difficulty concentrating, remembering, or making decisions?: No Patient able to express need for assistance with ADLs?: Yes Does the patient have difficulty dressing or bathing?: No Independently performs ADLs?: Yes (appropriate for developmental age) Does the patient have difficulty walking or climbing stairs?: Yes Weakness of Legs: Both Weakness of Arms/Hands: Both  Home Assistive Devices/Equipment Home Assistive Devices/Equipment: Cane (specify quad or straight), Walker (specify type), Wheelchair    Abuse/Neglect Assessment (Assessment to be complete while patient is alone) Physical Abuse: Denies Verbal Abuse: Denies Sexual Abuse: Denies Exploitation of patient/patient's resources: Denies Self-Neglect: Denies     Regulatory affairs officer (For Healthcare) Does patient have an advance directive?: No Would patient like information on creating an advanced directive?: No - patient declined information    Additional Information 1:1 In Past 12 Months?: No CIRT Risk: No Elopement Risk: No Does patient have medical clearance?: Yes     Disposition:  Disposition Initial Assessment Completed  for  this Encounter: Yes Disposition of Patient: Other dispositions  Wellington [place], MS, LCASA Assessment Counselor   03/26/2014 1:07 PM

## 2014-03-27 LAB — CBC
HCT: 33 % — ABNORMAL LOW (ref 39.0–52.0)
HEMOGLOBIN: 10.8 g/dL — AB (ref 13.0–17.0)
MCH: 26.6 pg (ref 26.0–34.0)
MCHC: 32.7 g/dL (ref 30.0–36.0)
MCV: 81.3 fL (ref 78.0–100.0)
Platelets: 173 10*3/uL (ref 150–400)
RBC: 4.06 MIL/uL — AB (ref 4.22–5.81)
RDW: 15.3 % (ref 11.5–15.5)
WBC: 3.5 10*3/uL — AB (ref 4.0–10.5)

## 2014-03-27 LAB — COMPREHENSIVE METABOLIC PANEL
ALT: 21 U/L (ref 0–53)
AST: 17 U/L (ref 0–37)
Albumin: 3.9 g/dL (ref 3.5–5.2)
Alkaline Phosphatase: 79 U/L (ref 39–117)
Anion gap: 6 (ref 5–15)
BUN: 12 mg/dL (ref 6–23)
CO2: 23 mmol/L (ref 19–32)
CREATININE: 0.83 mg/dL (ref 0.50–1.35)
Calcium: 8.9 mg/dL (ref 8.4–10.5)
Chloride: 108 mEq/L (ref 96–112)
GFR calc Af Amer: >90 mL/min (ref >90)
GFR calc non Af Amer: 90 mL/min — ABNORMAL LOW (ref >90)
Glucose, Bld: 99 mg/dL (ref 70–99)
Potassium: 3.7 mmol/L (ref 3.5–5.1)
Sodium: 137 mmol/L (ref 135–145)
Total Bilirubin: 0.5 mg/dL (ref 0.3–1.2)
Total Protein: 6.5 g/dL (ref 6.0–8.3)

## 2014-03-27 LAB — MRSA PCR SCREENING: MRSA by PCR: NEGATIVE

## 2014-03-27 LAB — PROTIME-INR
INR: 2.6 — ABNORMAL HIGH (ref 0.00–1.49)
Prothrombin Time: 28.1 seconds — ABNORMAL HIGH (ref 11.6–15.2)

## 2014-03-27 MED ORDER — OXYCODONE-ACETAMINOPHEN 5-325 MG PO TABS: 1.0000 | ORAL_TABLET | Freq: Three times a day (TID) | ORAL | Status: DC | PRN

## 2014-03-27 MED ORDER — BUPRENORPHINE HCL 2 MG SL SUBL: 8.0000 mg | SUBLINGUAL_TABLET | Freq: Every day | SUBLINGUAL | Status: DC

## 2014-03-27 MED ORDER — WARFARIN SODIUM 5 MG PO TABS
5.0000 mg | ORAL_TABLET | Freq: Once | ORAL | Status: AC
  Administered 2014-03-27: 5 mg via ORAL
  Filled 2014-03-27: qty 1

## 2014-03-27 MED ORDER — DIPHENHYDRAMINE HCL 25 MG PO CAPS
50.0000 mg | ORAL_CAPSULE | Freq: Once | ORAL | Status: AC
  Administered 2014-03-27: 50 mg via ORAL
  Filled 2014-03-27: qty 2

## 2014-03-27 MED ORDER — DILTIAZEM HCL ER COATED BEADS 180 MG PO CP24
180.0000 mg | ORAL_CAPSULE | Freq: Every day | ORAL | Status: DC
  Administered 2014-03-27 – 2014-03-29 (×3): 180 mg via ORAL
  Filled 2014-03-27 (×3): qty 1

## 2014-03-27 MED ORDER — PREGABALIN 75 MG PO CAPS
75.0000 mg | ORAL_CAPSULE | Freq: Two times a day (BID) | ORAL | Status: DC
  Administered 2014-03-28 – 2014-03-29 (×2): 75 mg via ORAL
  Filled 2014-03-27 (×2): qty 1

## 2014-03-27 NOTE — Progress Notes (Signed)
ANTICOAGULATION CONSULT NOTE - Initial Consult  Pharmacy Consult for Coumadin (chronic Rx PTA) Indication: Per MD, indication is unclear.  Pt placed on Warfarin postop last August in [place]. Denies h/o DVT, PE, or afib.  Pt is poor historian.    Allergies  Allergen Reactions  . Hydrocodone Nausea Only   Patient Measurements: Height: 6\' 1"  (185.4 cm) Weight: 235 lb (106.595 kg) IBW/kg (Calculated) : 79.9  Vital Signs: Temp: 98 F (36.7 C) (01/07 0626) Temp Source: Oral (01/07 0626) BP: 120/63 mmHg (01/07 0626) Pulse Rate: 48 (01/07 0626)  Labs:  Recent Labs  03/26/14 1228 03/26/14 1904 03/27/14 0619  HGB 11.9*  --  10.8*  HCT 36.2*  --  33.0*  PLT 216  --  173  APTT  --  49*  --   LABPROT  --  29.9* 28.1*  INR  --  2.82* 2.60*  CREATININE 0.95  --  0.83   Estimated Creatinine Clearance: 112.2 mL/min (by C-G formula based on Cr of 0.83).  Medical History: Past Medical History  Diagnosis Date  . Hyperlipidemia   . MRSA (methicillin resistant staph aureus) culture positive   . Hypertension   . Hypothyroidism    Medications:  Prescriptions prior to admission  Medication Sig Dispense Refill Last Dose  . baclofen (LIORESAL) 10 MG tablet Take 1 tablet (10 mg total) by mouth 3 (three) times daily. 30 each 3 03/26/2014 at Unknown time  . diltiazem (DILACOR XR) 240 MG 24 hr capsule Take 1 capsule (240 mg total) by mouth daily. 30 capsule 0 03/25/2014 at Unknown time  . fentaNYL (DURAGESIC - DOSED MCG/HR) 100 MCG/HR Place 1 patch (100 mcg total) onto the skin every 3 (three) days. 10 patch 0 03/23/2014  . levothyroxine (SYNTHROID, LEVOTHROID) 150 MCG tablet Take 150 mcg by mouth daily.  0 03/26/2014 at Unknown time  . omeprazole (PRILOSEC) 40 MG capsule Take 1 capsule (40 mg total) by mouth daily. 30 capsule 11 03/26/2014 at Unknown time  . oxybutynin (DITROPAN) 5 MG tablet Take 10 mg by mouth at bedtime.  0 03/25/2014 at Unknown time  . oxyCODONE-acetaminophen (PERCOCET) 10-325 MG  per tablet Take 1 tablet by mouth every 6 (six) hours as needed. (Patient taking differently: Take 1 tablet by mouth 3 (three) times daily as needed for pain. ) 120 tablet 0 03/26/2014 at Unknown time  . sertraline (ZOLOFT) 100 MG tablet Take 200 mg by mouth daily.   0 03/25/2014 at Unknown time  . sucralfate (CARAFATE) 1 G tablet Take 1 tablet (1 g total) by mouth 4 (four) times daily. 120 tablet 3 03/26/2014 at Unknown time  . tamsulosin (FLOMAX) 0.4 MG CAPS capsule Take 0.4 mg by mouth daily after supper.   0 03/25/2014 at Unknown time  . warfarin (COUMADIN) 5 MG tablet Take 1-2 tablets (5-10 mg total) by mouth daily at 6 PM. (Patient taking differently: Take 2.5-5 mg by mouth See admin instructions. 5 mg Monday-Friday.  2.5 mg on Saturday and sunday) 60 tablet 0 03/25/2014 at Unknown time   Assessment: 69yo male on chronic Coumadin PTA.  Per MD, indication is unclear.  Pt placed on Warfarin postop last August in [place]. Denies h/o DVT, PE, or afib.  Pt is poor historian.   INR is therapeutic.  Pt received Coumadin 2mg  last night.  Need to clarify home dosing regimen. No bleeding noted.    Goal of Therapy:  INR 2-3 Monitor platelets by anticoagulation protocol: Yes   Plan:   Coumadin  5mg  po today x 1  INR daily  Aslan Himes A 03/27/2014,8:48 AM

## 2014-03-27 NOTE — BH Assessment (Signed)
[place] Assessment Progress Note   Received call from [facility] in regard to pt's reassessment today.  Per pt's nurse, pt not medically cleared and won't be for a few days per the doctor.  Informed nurse that TTS would let [facility] extender know and he can determine along with doctor if another tele psych needs to be performed or whether another psych consult needs to be placed once pt med cleared.  Updated TTS staff.  Shaune Pascal, MS, [facility] Licensed Professional Counselor Therapeutic Triage Specialist [facility] Phone: [phone] Fax: [phone]

## 2014-03-27 NOTE — Progress Notes (Signed)
UR chart review completed.  

## 2014-03-27 NOTE — Evaluation (Signed)
Physical Therapy Evaluation Patient Details Name: Kenneth Rhodes MRN: [id number] DOB: Jun 28, 1945 Today's Date: 03/27/2014   History of Present Illness  Pt is a 69 year old male Pt presenting trying to detox from opioids. States he had spinal surgery in [place] in August and got hooked on opioids. Spent several months in rehab for MSK/nerve issues. Pt has been at home for past 2 mo and taking increasing amounts of opioids and becoming increasingly depressed. Pt endorses taking up to 20mg  Oxycodone every 4 hours and 100mg  Fentanyl patch daily and chewing it after taking it off his skin. His supply has been running low lately so he's been bying more off the street and recently started using Heroine. Reports using up to 1 gram of heroine when oral narcotics not available. Pt feels like he's going in to withdrawal - diaphoresis, spasms, tremors. Denies CP, palpitations, syncope. Reports thoughts of suicide and hopelessness but no plan to carry out thoughts of suicide. Denies HI.   Clinical Impression  Pt is a 69 year old male who presents to PT with dx of opiate withdrawal.  Pt reports hx of back surgery last year, with Rt leg being "paralyzed".  Pt reports being in rehab for 4 months, with improved functional skills though numbness on Rt LE continues.  Pt reports new symptoms of Rt LE spasms, though none present during PT evaluation.  Pt was (I) with bed mobility skills, mod (I) with transfers, and gait in hallways.  Gait distance limited secondary to fatigue (pt reports limited ambulation at home, and use of electric scooter in stores as needed).  Pt appears at baseline level of function.  Education provided to pt is withdrawal symptoms increase or spasm to Rt leg increase, pt may need to use personal RW for safety with gait.  Pt does state he thinks his sister will come stay with him at d/c to assist as needed. Pt to be d/c from acute PT services.  No DME recommendations.     Follow Up Recommendations  No PT follow up    Equipment Recommendations  None recommended by PT       Precautions / Restrictions Precautions Precautions: Fall Precaution Comments: Hx of Rt LE numbness from back surgery last year Restrictions Weight Bearing Restrictions: No      Mobility  Bed Mobility Overal bed mobility: Independent                Transfers Overall transfer level: Modified independent                  Ambulation/Gait Ambulation/Gait assistance: Modified independent (Device/Increase time) Ambulation Distance (Feet): 250 Feet Assistive device: Straight cane Gait Pattern/deviations: Step-through pattern   Gait velocity interpretation: at or above normal speed for age/gender General Gait Details: Gait distance self limited secondary to fatigue.      Balance Overall balance assessment: No apparent balance deficits (not formally assessed)                                           Pertinent Vitals/Pain Pain Assessment: 0-10 Pain Score: 8  Pain Location: low back Pain Descriptors / Indicators: Aching;Sore Pain Intervention(s): Limited activity within patient's tolerance;Monitored during session;Repositioned    Home Living Family/patient expects to be discharged to:: Private residence Living Arrangements: Alone (Pt reports he is going to try to get his sister to stay with him  at d/c) Available Help at Discharge: Family;Friend(s);Available PRN/intermittently Type of Home: Apartment (1st floor) Home Access: Level entry     Home Layout: One level Home Equipment: Walker - 2 wheels;Cane - single point;Wheelchair - manual;Bedside commode      Prior Function Level of Independence: Independent with assistive device(s);Needs assistance   Gait / Transfers Assistance Needed: Mod (I) wth bed mobility skills, transfers, and household gait with use of std cane in Lt hand.  Pt repors use of electric scooter around grocery store/[employer]     Comments: Pt  has someone drive him secondary to hx of Rt LE numbness     Hand Dominance   Dominant Hand: Right    Extremity/Trunk Assessment   Upper Extremity Assessment: Defer to OT evaluation           Lower Extremity Assessment: Overall WFL for tasks assessed (Mild limitations in Rt side MMT, though this is pt baseline.  )         Communication   Communication: No difficulties  Cognition Arousal/Alertness: Awake/alert Behavior During Therapy: WFL for tasks assessed/performed Overall Cognitive Status: Within Functional Limits for tasks assessed                       Assessment/Plan    PT Assessment Patent does not need any further PT services  PT Diagnosis     PT Problem List    PT Treatment Interventions     PT Goals (Current goals can be found in the Care Plan section) Acute Rehab PT Goals PT Goal Formulation: All assessment and education complete, DC therapy     End of Session Equipment Utilized During Treatment: Gait belt Activity Tolerance: Patient limited by fatigue Patient left: in bed;with nursing/sitter in room;with call bell/phone within reach           Time: 5038-8828 PT Time Calculation (min) (ACUTE ONLY): 11 min   Charges:   PT Evaluation $Initial PT Evaluation Tier I: 1 Procedure     Lonna Cobb, DPT [phone]

## 2014-03-27 NOTE — Progress Notes (Signed)
Kenneth Rhodes WNU:[id number] DOB: 1946-01-06 DOA: 03/26/2014 PCP: Lysbeth Penner, FNP  Brief narrative: 69 year old male status complicated spinal surgery with multiple other issues admitted to [facility] requesting detox.  Psychiatry consulted for telemetry psych evaluation He states that his issues date back to about 2008 when he had spinal fusion surgery at [facility] under Dr. Jeanette Caprice. He subsequently had an epidural steroid injection sometime earlier this year [records not available for us] and he contracted MRSA which spread to his spine and had to have an emergent neurosurgical procedure done under Dr. Walden Field of [facility] in [place] .   the rest is as per history of present illness  Past medical history-As per Problem list Chart reviewed as below- No records, in process of obtaining  Consultants:  None currently  Procedures:  None  Antibiotics:  None   Subjective  Reports pain as being 8/10 mainly in the lower extremities Requesting further pain management   long discussion with patient and nurse in the room about expect patient's management and other issues   Objective    Interim History:   Telemetry: Sinus    Objective: Filed Vitals:   03/26/14 1521 03/26/14 1810 03/26/14 2200 03/27/14 0626  BP: 113/71 129/75 127/76 120/63  Pulse: 62 64 48 48  Temp: 98.2 F (36.8 C)  98.5 F (36.9 C) 98 F (36.7 C)  TempSrc: Oral  Oral Oral  Resp: 18 18 20 20   Height:      Weight:      SpO2: 99% 100% 100% 97%    Intake/Output Summary (Last 24 hours) at 03/27/14 1117 Last data filed at 03/27/14 0900  Gross per 24 hour  Intake    360 ml  Output    700 ml  Net   -340 ml    Exam:  General: Alert pleasant oriented is not appear to be overtly in pain Cardiovascular: S1-S2 no murmur rub or gallop Respiratory: Clinically clear\ Able to lift both Lower extremities up off of bed  Data Reviewed: Basic Metabolic Panel:  Recent Labs Lab  03/26/14 1228 03/27/14 0619  NA 139 137  K 4.3 3.7  CL 106 108  CO2 28 23  GLUCOSE 102* 99  BUN 12 12  CREATININE 0.95 0.83  CALCIUM 9.5 8.9   Liver Function Tests:  Recent Labs Lab 03/27/14 0619  AST 17  ALT 21  ALKPHOS 79  BILITOT 0.5  PROT 6.5  ALBUMIN 3.9   No results for input(s): LIPASE, AMYLASE in the last 168 hours. No results for input(s): AMMONIA in the last 168 hours. CBC:  Recent Labs Lab 03/26/14 1228 03/27/14 0619  WBC 4.6 3.5*  NEUTROABS 3.0  --   HGB 11.9* 10.8*  HCT 36.2* 33.0*  MCV 81.2 81.3  PLT 216 173   Cardiac Enzymes: No results for input(s): CKTOTAL, CKMB, CKMBINDEX, TROPONINI in the last 168 hours. BNP: Invalid input(s): POCBNP CBG: No results for input(s): GLUCAP in the last 168 hours.  Recent Results (from the past 240 hour(s))  MRSA PCR Screening     Status: None   Collection Time: 03/26/14 10:52 PM  Result Value Ref Range Status   MRSA by PCR NEGATIVE NEGATIVE Final    Comment:        The GeneXpert MRSA Assay (FDA approved for NASAL specimens only), is one component of a comprehensive MRSA colonization surveillance program. It is not intended to diagnose MRSA infection nor to guide or monitor treatment for MRSA infections.  Studies:              All Imaging reviewed and is as per above notation   Scheduled Meds: . cloNIDine  0.1 mg Oral QID   Followed by  . [START ON 03/28/2014] cloNIDine  0.1 mg Oral BH-qamhs   Followed by  . [START ON 03/31/2014] cloNIDine  0.1 mg Oral QAC breakfast  . diltiazem  180 mg Oral Daily  . feeding supplement (ENSURE COMPLETE)  237 mL Oral BID BM  . levothyroxine  150 mcg Oral QAC breakfast  . pantoprazole  80 mg Oral Daily  . sertraline  200 mg Oral Daily  . sodium chloride  3 mL Intravenous Q12H  . sucralfate  1 g Oral QID  . tamsulosin  0.4 mg Oral QPC supper  . traMADol  100 mg Oral 4 times per day  . warfarin  5 mg Oral Once  . Warfarin - Pharmacist Dosing Inpatient    Does not apply Q24H   Continuous Infusions: . sodium chloride 100 mL/hr at 03/27/14 8657     Assessment/Plan: 1. Opiate use secondary to chronic low back pain with withdrawal-difficult situation. This is best probably helped by pain specialist and I believe that Dr. Merlene Laughter of neurology also has psychiatric ground and is credentialed to use medications such as Suboxone. I will continue patient's clonidine taper , I have told him that the expectations for pain relief would be may be a 6-7/10 and as such today I will temporarily increase his Percocet one tablet every 8 when necessary to 2 tablets every 8 when necessary 5/325 dose. Agree with tramadol every 6 hourly as an NMDA. antagonist /partial agonist  2.  major depression without overt suicidality or plan-telemetry psychiatry input is pending, continues sertraline 200 daily 3. Urinary retention-? BPH-patient has been passing urine. We will not administer urinary catheter at this time. 4. Hypertension continue diltiazem 5. Lumbar radiculopathy-we will start gabapentin once neurology sees the patient 6. Hypothyroid-etiology likely secondary to depression of HPA axis with chronic pain. Neurology to comment. I do not think he needs further Synthroid as an outpatient if TSH normalizes 7. Chronic anticoagulation, continue Coumadin for now-further management as per her PCP  Code Status:  Full No family at bedside Inpatient pending telemetry psychiatry and neurology input    Verneita Griffes, MD  [facility] Pager [phone] 03/27/2014, 11:17 AM    LOS: 1 day

## 2014-03-27 NOTE — Progress Notes (Signed)
Patient requested something for sleep, paged on-call mid-level MD, will follow orders given.

## 2014-03-27 NOTE — Progress Notes (Signed)
Spoke with Dr Merlene Laughter who requested all narcotics be discontinued.  Will notify Dr Verlon Au.     Santiago Glad m. Black, NP

## 2014-03-27 NOTE — Progress Notes (Signed)
Pt voided 200cc of clear yellow urine. Bladder scan performed and pt had 20cc of urine noted.

## 2014-03-27 NOTE — Care Management Note (Signed)
    Page 1 of 1   03/28/2014     3:44:28 PM CARE MANAGEMENT NOTE 03/28/2014  Patient:  Kenneth Rhodes, Kenneth Rhodes   Account Number:  [id number]  Date Initiated:  03/27/2014  Documentation initiated by:  Theophilus Kinds  Subjective/Objective Assessment:   Pt admitted from home with opiate w/d. Pt lives alone and is fairly independent with ADl's. Pt has a w/c, walker for home use. Pt would like assistance with drug dependence.     Action/Plan:   TTS consult pending. Will continue to follow for discharge planning needs.   Anticipated DC Date:  03/29/2014   Anticipated DC Plan:  [place]  CM consult      Choice offered to / List presented to:             Status of service:  Completed, signed off Medicare Important Message given?  YES (If response is "NO", the following Medicare IM given date fields will be blank) Date Medicare IM given:  03/28/2014 Medicare IM given by:  Theophilus Kinds Date Additional Medicare IM given:   Additional Medicare IM given by:    Discharge Disposition:  HOME/SELF CARE  Per UR Regulation:    If discussed at Long Length of Stay Meetings, dates discussed:    Comments:  03/28/14 [place], RN BSN CM TTs has cleared pt for inpatient psych placement. Pt having diarrhea so MD wants to watch for 24 hours. Anticipate discharge within 24 hours. No CM needs noted.  03/27/14 [place], RN BSN CM

## 2014-03-27 NOTE — Consult Note (Signed)
[place] A. Merlene Laughter, MD     [email or web address]          Kenneth Rhodes is an 69 y.o. male.   ASSESSMENT/PLAN: 1. Chronic lumbosacral radiculopathy status post multiple back surgeries.  2. Chronic low back pain.  3. New onset opioid use disorder.  4. Mild gait impairment as a result of epidural abscess.    After lengthy discussion, the patient has agreed to buprenorphine therapy. I did discuss with patient need for psychological treatment in addition buprenorphine therapy. The patient will be scheduled for an induction tomorrow. He is to remain off all opioids for the next 24 hours.  The patient seemed not to be on significant non-opioid adjunctive therapies. This will be pursued. Consequently, on Lyrica will be added. Possibly want to consider adding a tricyclic or as an SNRI such as Cymbalta.  Given that the patient is mobile, I think he should be taken off warfarin. There is no continued need for this for DVT prophylaxis long-term.   The patient is a 69 year old white male who has a long-standing history of chronic back problems. The patient underwent low back surgery in 2008. The patient has been on chronic opioids since then. He was maintained on oxycodone 15 mg 6 tablets a day until more recently. The patient was getting epidural injections and developed a knot in the low back area. It appears that the epidural injection may have caused an large abscess. The patient was paralyzed from the waist down and collapsed. This required urgent surgery. He had an extensive surgery from the thoracic region all way down to the sacral region. The patient had a very long recovery period. He was paralyzed from the waist down but with therapy eventually was able to walk. The patient was maintained on fentanyl 100 g along with Percocet 10 mg 3 times a day. Surgery was apparently in August 2015. He was released from the nursing facility a about 3 months ago. The patient  reports that since then he started taking more of his medications. Apparently one month of medication would rather not to 2 weeks. He was taking more of his Percocet tablets. He was using his back and the patch for 2 days and then the effect would wear off. His subsequent taken off and chew it. The patient started obtaining medication off the streets. He would obtain Opana ER melted by various means and injected. The patient also started injecting heroin. The patient started getting desperate, light and board money from his relatives to support his drug habits. The patient reports that previous to this he was taking his medication responsibly until his most recent surgery. The patient denies cocaine use or other illicit drug use. He did use marijuana a few times as a teenager. It appears the patient has not tried other medications such as Lyrica or Cymbalta. The patient reports that he has a lot of back pain. He currently does not have a lot of nausea and vomiting. His last opiate was early this morning. The patient reports having a lot of spasms especially in his been off his opioids over the past day. He complains a lot of numbness and tingling involving involving the entire right lower extremity extending into the right buttocks. This has been present since his last surgery. The patient reports having spasms involving the chest regions bilaterally especially on the left side is into the left shoulder. The patient states he has been on warfarin because of DVT prophylaxis ever since he  was probably from the waist down a few months ago. He now is ambulating with assistive devices. Review of systems otherwise negative.  GENERAL: Pleasant man in no acute distress.  HEENT: Supple. Atraumatic normocephalic.   ABDOMEN: soft  EXTREMITIES: No edema. Right knee replacement.   BACK: Normal.  SKIN: Normal by inspection.    MENTAL STATUS: Alert and oriented. Speech, language and cognition are generally intact.  Judgment and insight normal.   CRANIAL NERVES: Pupils are equal, round and reactive to light and accommodation; extra ocular movements are full, there is no significant nystagmus; visual fields are full; upper and lower facial muscles are normal in strength and symmetric, there is no flattening of the nasolabial folds; tongue is midline; uvula is midline; shoulder elevation is normal.  MOTOR: Normal tone, bulk and strength; no pronator drift.  COORDINATION: Left finger to nose is normal, right finger to nose is normal, No rest tremor; no intention tremor; no postural tremor; no bradykinesia.  REFLEXES: Deep tendon reflexes are symmetrical and normal.   SENSATION: Reduced to light touch and temperature involving the entire right lower extremity.   Blood pressure 123/72, pulse 52, temperature 98.3 F (36.8 C), temperature source Oral, resp. rate 20, height $RemoveBe'6\' 1"'almbbMdLh$  (1.854 m), weight 106.595 kg (235 lb), SpO2 98 %.  Past Medical History  Diagnosis Date  . Hyperlipidemia   . MRSA (methicillin resistant staph aureus) culture positive   . Hypertension   . Hypothyroidism     Past Surgical History  Procedure Laterality Date  . Spine surgery    . Joint replacement Right     knee  . Lumbar epidural injection      History reviewed. No pertinent family history.  Social History:  reports that he quit smoking about 20 years ago. He does not have any smokeless tobacco history on file. He reports that he uses illicit drugs. He reports that he does not drink alcohol.  Allergies:  Allergies  Allergen Reactions  . Hydrocodone Nausea Only    Medications: Prior to Admission medications   Medication Sig Start Date End Date Taking? Authorizing Provider  baclofen (LIORESAL) 10 MG tablet Take 1 tablet (10 mg total) by mouth 3 (three) times daily. 03/07/14  Yes Lysbeth Penner, FNP  diltiazem (DILACOR XR) 240 MG 24 hr capsule Take 1 capsule (240 mg total) by mouth daily. 02/20/14  Yes Lysbeth Penner, FNP  fentaNYL (DURAGESIC - DOSED MCG/HR) 100 MCG/HR Place 1 patch (100 mcg total) onto the skin every 3 (three) days. 03/07/14  Yes Lysbeth Penner, FNP  levothyroxine (SYNTHROID, LEVOTHROID) 150 MCG tablet Take 150 mcg by mouth daily. 01/31/14  Yes Historical Provider, MD  omeprazole (PRILOSEC) 40 MG capsule Take 1 capsule (40 mg total) by mouth daily. 03/07/14  Yes Lysbeth Penner, FNP  oxybutynin (DITROPAN) 5 MG tablet Take 10 mg by mouth at bedtime. 01/31/14  Yes Historical Provider, MD  oxyCODONE-acetaminophen (PERCOCET) 10-325 MG per tablet Take 1 tablet by mouth every 6 (six) hours as needed. Patient taking differently: Take 1 tablet by mouth 3 (three) times daily as needed for pain.  03/07/14  Yes Lysbeth Penner, FNP  sertraline (ZOLOFT) 100 MG tablet Take 200 mg by mouth daily.  02/28/14  Yes Historical Provider, MD  sucralfate (CARAFATE) 1 G tablet Take 1 tablet (1 g total) by mouth 4 (four) times daily. 03/07/14  Yes Lysbeth Penner, FNP  tamsulosin (FLOMAX) 0.4 MG CAPS capsule Take 0.4 mg by mouth daily  after supper.  01/31/14  Yes Historical Provider, MD  warfarin (COUMADIN) 5 MG tablet Take 1-2 tablets (5-10 mg total) by mouth daily at 6 PM. Patient taking differently: Take 2.5-5 mg by mouth See admin instructions. 5 mg Monday-Friday.  2.5 mg on Saturday and sunday 02/07/14  Yes Tammy Eckard, PHARMD    Scheduled Meds: . cloNIDine  0.1 mg Oral QID   Followed by  . [START ON 03/28/2014] cloNIDine  0.1 mg Oral BH-qamhs   Followed by  . [START ON 03/31/2014] cloNIDine  0.1 mg Oral QAC breakfast  . diltiazem  180 mg Oral Daily  . feeding supplement (ENSURE COMPLETE)  237 mL Oral BID BM  . levothyroxine  150 mcg Oral QAC breakfast  . pantoprazole  80 mg Oral Daily  . sertraline  200 mg Oral Daily  . sodium chloride  3 mL Intravenous Q12H  . sucralfate  1 g Oral QID  . tamsulosin  0.4 mg Oral QPC supper  . Warfarin - Pharmacist Dosing Inpatient   Does not apply Q24H    Continuous Infusions: . sodium chloride 100 mL/hr at 03/27/14 1635   PRN Meds:.diazepam, dicyclomine, hydrOXYzine, loperamide, methocarbamol, naproxen, ondansetron     Results for orders placed or performed during the hospital encounter of 03/26/14 (from the past 48 hour(s))  Basic metabolic panel     Status: Abnormal   Collection Time: 03/26/14 12:28 PM  Result Value Ref Range   Sodium 139 135 - 145 mmol/L    Comment: Please note change in reference range.   Potassium 4.3 3.5 - 5.1 mmol/L    Comment: Please note change in reference range.   Chloride 106 96 - 112 mEq/L   CO2 28 19 - 32 mmol/L   Glucose, Bld 102 (H) 70 - 99 mg/dL   BUN 12 6 - 23 mg/dL   Creatinine, Ser 0.95 0.50 - 1.35 mg/dL   Calcium 9.5 8.4 - 10.5 mg/dL   GFR calc non Af Amer 85 (L) >90 mL/min   GFR calc Af Amer >90 >90 mL/min    Comment: (NOTE) The eGFR has been calculated using the CKD EPI equation. This calculation has not been validated in all clinical situations. eGFR's persistently <90 mL/min signify possible Chronic Kidney Disease.    Anion gap 5 5 - 15  CBC with Differential     Status: Abnormal   Collection Time: 03/26/14 12:28 PM  Result Value Ref Range   WBC 4.6 4.0 - 10.5 K/uL   RBC 4.46 4.22 - 5.81 MIL/uL   Hemoglobin 11.9 (L) 13.0 - 17.0 g/dL   HCT 36.2 (L) 39.0 - 52.0 %   MCV 81.2 78.0 - 100.0 fL   MCH 26.7 26.0 - 34.0 pg   MCHC 32.9 30.0 - 36.0 g/dL   RDW 15.3 11.5 - 15.5 %   Platelets 216 150 - 400 K/uL   Neutrophils Relative % 66 43 - 77 %   Neutro Abs 3.0 1.7 - 7.7 K/uL   Lymphocytes Relative 24 12 - 46 %   Lymphs Abs 1.1 0.7 - 4.0 K/uL   Monocytes Relative 9 3 - 12 %   Monocytes Absolute 0.4 0.1 - 1.0 K/uL   Eosinophils Relative 0 0 - 5 %   Eosinophils Absolute 0.0 0.0 - 0.7 K/uL   Basophils Relative 1 0 - 1 %   Basophils Absolute 0.0 0.0 - 0.1 K/uL  Ethanol     Status: None   Collection Time: 03/26/14 12:28 PM  Result Value Ref Range   Alcohol, Ethyl (B) <5 0 - 9 mg/dL     Comment:        LOWEST DETECTABLE LIMIT FOR SERUM ALCOHOL IS 11 mg/dL FOR MEDICAL PURPOSES ONLY   Urine rapid drug screen (hosp performed)     Status: Abnormal   Collection Time: 03/26/14  4:40 PM  Result Value Ref Range   Opiates POSITIVE (A) NONE DETECTED   Cocaine NONE DETECTED NONE DETECTED   Benzodiazepines NONE DETECTED NONE DETECTED   Amphetamines NONE DETECTED NONE DETECTED   Tetrahydrocannabinol NONE DETECTED NONE DETECTED   Barbiturates NONE DETECTED NONE DETECTED    Comment:        DRUG SCREEN FOR MEDICAL PURPOSES ONLY.  IF CONFIRMATION IS NEEDED FOR ANY PURPOSE, NOTIFY LAB WITHIN 5 DAYS.        LOWEST DETECTABLE LIMITS FOR URINE DRUG SCREEN Drug Class       Cutoff (ng/mL) Amphetamine      1000 Barbiturate      200 Benzodiazepine   409 Tricyclics       811 Opiates          300 Cocaine          300 THC              50   Protime-INR     Status: Abnormal   Collection Time: 03/26/14  7:04 PM  Result Value Ref Range   Prothrombin Time 29.9 (H) 11.6 - 15.2 seconds   INR 2.82 (H) 0.00 - 1.49  APTT     Status: Abnormal   Collection Time: 03/26/14  7:04 PM  Result Value Ref Range   aPTT 49 (H) 24 - 37 seconds    Comment:        IF BASELINE aPTT IS ELEVATED, SUGGEST PATIENT RISK ASSESSMENT BE USED TO DETERMINE APPROPRIATE ANTICOAGULANT THERAPY.   MRSA PCR Screening     Status: None   Collection Time: 03/26/14 10:52 PM  Result Value Ref Range   MRSA by PCR NEGATIVE NEGATIVE    Comment:        The GeneXpert MRSA Assay (FDA approved for NASAL specimens only), is one component of a comprehensive MRSA colonization surveillance program. It is not intended to diagnose MRSA infection nor to guide or monitor treatment for MRSA infections.   Urinalysis, Routine w reflex microscopic     Status: None   Collection Time: 03/26/14 10:55 PM  Result Value Ref Range   Color, Urine YELLOW YELLOW   APPearance CLEAR CLEAR   Specific Gravity, Urine 1.020 1.005 - 1.030    pH 7.0 5.0 - 8.0   Glucose, UA NEGATIVE NEGATIVE mg/dL   Hgb urine dipstick NEGATIVE NEGATIVE   Bilirubin Urine NEGATIVE NEGATIVE   Ketones, ur NEGATIVE NEGATIVE mg/dL   Protein, ur NEGATIVE NEGATIVE mg/dL   Urobilinogen, UA 0.2 0.0 - 1.0 mg/dL   Nitrite NEGATIVE NEGATIVE   Leukocytes, UA NEGATIVE NEGATIVE    Comment: MICROSCOPIC NOT DONE ON URINES WITH NEGATIVE PROTEIN, BLOOD, LEUKOCYTES, NITRITE, OR GLUCOSE <1000 mg/dL.  CBC     Status: Abnormal   Collection Time: 03/27/14  6:19 AM  Result Value Ref Range   WBC 3.5 (L) 4.0 - 10.5 K/uL   RBC 4.06 (L) 4.22 - 5.81 MIL/uL   Hemoglobin 10.8 (L) 13.0 - 17.0 g/dL   HCT 33.0 (L) 39.0 - 52.0 %   MCV 81.3 78.0 - 100.0 fL   MCH 26.6 26.0 - 34.0 pg  MCHC 32.7 30.0 - 36.0 g/dL   RDW 15.3 11.5 - 15.5 %   Platelets 173 150 - 400 K/uL  Comprehensive metabolic panel     Status: Abnormal   Collection Time: 03/27/14  6:19 AM  Result Value Ref Range   Sodium 137 135 - 145 mmol/L    Comment: Please note change in reference range.   Potassium 3.7 3.5 - 5.1 mmol/L    Comment: Please note change in reference range.   Chloride 108 96 - 112 mEq/L   CO2 23 19 - 32 mmol/L   Glucose, Bld 99 70 - 99 mg/dL   BUN 12 6 - 23 mg/dL   Creatinine, Ser 0.83 0.50 - 1.35 mg/dL   Calcium 8.9 8.4 - 10.5 mg/dL   Total Protein 6.5 6.0 - 8.3 g/dL   Albumin 3.9 3.5 - 5.2 g/dL   AST 17 0 - 37 U/L   ALT 21 0 - 53 U/L   Alkaline Phosphatase 79 39 - 117 U/L   Total Bilirubin 0.5 0.3 - 1.2 mg/dL   GFR calc non Af Amer 90 (L) >90 mL/min   GFR calc Af Amer >90 >90 mL/min    Comment: (NOTE) The eGFR has been calculated using the CKD EPI equation. This calculation has not been validated in all clinical situations. eGFR's persistently <90 mL/min signify possible Chronic Kidney Disease.    Anion gap 6 5 - 15  Protime-INR     Status: Abnormal   Collection Time: 03/27/14  6:19 AM  Result Value Ref Range   Prothrombin Time 28.1 (H) 11.6 - 15.2 seconds   INR 2.60 (H)  0.00 - 1.49    Studies/Results:     [place] A. Merlene Laughter, M.D.  Diplomate, Tax adviser of Psychiatry and Neurology ( Neurology). 03/27/2014, 5:17 PM

## 2014-03-27 NOTE — Clinical Social Work Note (Signed)
Pt assessed by TTS yesterday and recommendation was for inpatient admission. [name] at [facility] confirms that pt will be re-assessed today.   Kenneth Rhodes, [place]

## 2014-03-27 NOTE — Progress Notes (Signed)
INITIAL NUTRITION ASSESSMENT  DOCUMENTATION CODES Per approved criteria  -Obesity Unspecified   INTERVENTION: -Continue with Ensure Complete po BID, each supplement provides 350 kcal and 13 grams of protein  NUTRITION DIAGNOSIS: Inadequate oral intake related to decreased appetite as evidenced by PO: 50%, diet hx.   Goal: Pt will meet >90% of estimated nutritional needs  Monitor:  PO/supplement intake, labs, weight changes, I/O's  Reason for Assessment: MST=5  69 y.o. male  Admitting Dx: Opiate withdrawal  69 year old male status complicated spinal surgery with multiple other issues admitted to [facility] requesting detox. Psychiatry consulted for telemetry psych evaluation He states that his issues date back to about 2008 when he had spinal fusion surgery at [facility] under Dr. Jeanette Caprice. He subsequently had an epidural steroid injection sometime earlier this year [records not available for us] and he contracted MRSA which spread to his spine and had to have an emergent neurosurgical procedure done under Dr. Walden Field of [place] in [place] .   ASSESSMENT: Pt admitted with opiate withdrawal. He is requesting detox. Psych consult pending.  Pt with flat affect and not very engaged for interview. He confirms a progressive decline in health since August, after having surgery. He reports he last lost 40# (14.5%) since that time period, however, limited wt hx available to confirm. Documented wt hx reveals an 11# (4.5%) wt loss over the past 2 months.  Pt reports a chronic poor appetite since August. He reports his intake continues to be poor; noted 50% meal completion. He reports he has been given Ensure and enjoys the flavor. He is agreeable with continued order of Ensure Complete BID. Discussed importance of good intake of both meals and supplements to support healing and prevent further weight loss. Nutrition-focused physical exam deferred at this time.  Labs reviewed. WDL.    Height: Ht Readings from Last 1 Encounters:  03/26/14 6\' 1"  (1.854 m)    Weight: Wt Readings from Last 1 Encounters:  03/26/14 235 lb (106.595 kg)    Ideal Body Weight: 184#  % Ideal Body Weight: 128%  Wt Readings from Last 10 Encounters:  03/26/14 235 lb (106.595 kg)  03/07/14 235 lb (106.595 kg)  02/07/14 246 lb 12.8 oz (111.948 kg)    Usual Body Weight: 275#  % Usual Body Weight: 85%  BMI:  Body mass index is 31.01 kg/(m^2). Obesity, class I  Estimated Nutritional Needs: Kcal: 2000-2200 Protein: 89-99 grams Fluid: 2.0-2.2 L  Skin: WDL  Diet Order:  regular  EDUCATION NEEDS: -Education needs addressed   Intake/Output Summary (Last 24 hours) at 03/27/14 1554 Last data filed at 03/27/14 1330  Gross per 24 hour  Intake    360 ml  Output   1250 ml  Net   -890 ml    Last BM: 17/16  Labs:   Recent Labs Lab 03/26/14 1228 03/27/14 0619  NA 139 137  K 4.3 3.7  CL 106 108  CO2 28 23  BUN 12 12  CREATININE 0.95 0.83  CALCIUM 9.5 8.9  GLUCOSE 102* 99    CBG (last 3)  No results for input(s): GLUCAP in the last 72 hours.  Scheduled Meds: . cloNIDine  0.1 mg Oral QID   Followed by  . [START ON 03/28/2014] cloNIDine  0.1 mg Oral BH-qamhs   Followed by  . [START ON 03/31/2014] cloNIDine  0.1 mg Oral QAC breakfast  . diltiazem  180 mg Oral Daily  . feeding supplement (ENSURE COMPLETE)  237 mL Oral BID BM  .  levothyroxine  150 mcg Oral QAC breakfast  . pantoprazole  80 mg Oral Daily  . sertraline  200 mg Oral Daily  . sodium chloride  3 mL Intravenous Q12H  . sucralfate  1 g Oral QID  . tamsulosin  0.4 mg Oral QPC supper  . traMADol  100 mg Oral 4 times per day  . Warfarin - Pharmacist Dosing Inpatient   Does not apply Q24H    Continuous Infusions: . sodium chloride 100 mL/hr at 03/27/14 8478    Past Medical History  Diagnosis Date  . Hyperlipidemia   . MRSA (methicillin resistant staph aureus) culture positive   . Hypertension   .  Hypothyroidism     Past Surgical History  Procedure Laterality Date  . Spine surgery    . Joint replacement Right     knee  . Lumbar epidural injection      Alejandro Gamel A. Jimmye Norman, RD, LDN, CDE Pager: [phone]

## 2014-03-28 DIAGNOSIS — F1994 Other psychoactive substance use, unspecified with psychoactive substance-induced mood disorder: Secondary | ICD-10-CM

## 2014-03-28 DIAGNOSIS — F332 Major depressive disorder, recurrent severe without psychotic features: Secondary | ICD-10-CM

## 2014-03-28 LAB — PROTIME-INR
INR: 1.98 — AB (ref 0.00–1.49)
PROTHROMBIN TIME: 22.7 s — AB (ref 11.6–15.2)

## 2014-03-28 MED ORDER — WARFARIN SODIUM 5 MG PO TABS
7.5000 mg | ORAL_TABLET | Freq: Once | ORAL | Status: AC
  Administered 2014-03-28: 7.5 mg via ORAL
  Filled 2014-03-28: qty 2

## 2014-03-28 MED ORDER — BUPRENORPHINE HCL 2 MG SL SUBL
4.0000 mg | SUBLINGUAL_TABLET | SUBLINGUAL | Status: AC
  Administered 2014-03-28 (×3): 4 mg via SUBLINGUAL
  Filled 2014-03-28 (×3): qty 2

## 2014-03-28 NOTE — Clinical Social Work Note (Signed)
CSW spoke with [facility] and pt has been cleared by psychiatry. CSW met with pt to discuss follow up. He states he has no interest in seeing a psychiatrist or therapist and does not think this is necessary. CSW encouraged pt to consider follow up. He refused again, but would accept a list of outpatient providers in the area. CSW discussed with MD who is aware and states pt should d/c tomorrow.  Benay Pike, [place]

## 2014-03-28 NOTE — Clinical Social Work Note (Signed)
CSW spoke with Cyril Mourning at [facility] and notified her that pt was medically stable. Awaiting re-assessment by TTS.  Kenneth Rhodes, [place]

## 2014-03-28 NOTE — Consult Note (Signed)
Telepsych Consultation   Reason for Consult:  Opiate Detox, Vague SI 2 days ago Referring Physician:  EDP [place] Kenneth Rhodes is an 69 y.o. male.  Assessment: AXIS I:  Major Depression, Recurrent severe, Substance Abuse and Substance Induced Mood Disorder AXIS II:  Deferred AXIS III:   Past Medical History  Diagnosis Date  . Hyperlipidemia   . MRSA (methicillin resistant staph aureus) culture positive   . Hypertension   . Hypothyroidism    AXIS IV:  other psychosocial or environmental problems and problems related to social environment AXIS V:  51-60 moderate symptoms  Plan:  No evidence of imminent risk to self or others at present.   Patient does not meet criteria for psychiatric inpatient admission. Supportive therapy provided about ongoing stressors. Refer to IOP. Discussed crisis plan, support from social network, calling 911, coming to the Emergency Department, and calling Suicide Hotline.  Subjective:   Kenneth Rhodes is a 69 y.o. male patient admitted with reports of vague suicidal thoughts without plan 2 days ago. Pt denies SI, HI, and AVH, contracts for safety, and reports multiple family members as reasoning for not harming himself. He reports that he had fleeting thoughts only when he was under the influence of opiates and feeling hopeless but that he never formulated a plan to harm himself. He reports that he continues to have diarrhea but that he feels he can manage this at home. *Note: Telepsych video camera is not working at this time, only audio information was obtained.  This NP spoke to Dr. Verlon Au [facility] attending) and he states that pt looks ill at this time and that he would like to keep the patient overnight due to his diarrhea. Clonidine protocol is already complete and pt is not exhibiting other signs of opiate withdrawal. Pt is cleared from a psychiatry standpoint at this time but may benefit from above plan to observe for hydration and management of loose  stools. TRH to continue medical management until discharge when they see fit.   HPI:  69 year old male status complicated spinal surgery with multiple other issues admitted to [facility] requesting detox. Psychiatry consulted for telemetry psych evaluation He states that his issues date back to about 2008 when he had spinal fusion surgery at [facility] under Dr. Jeanette Rhodes. He subsequently had an epidural steroid injection sometime earlier this year [records not available for us] and he contracted MRSA which spread to his spine and had to have an emergent neurosurgical procedure done under Dr. Walden Rhodes of [employer] in [place] .  the rest is as per history of present illness  HPI Elements:   Location:  Psychiatric. Quality:  Stable, improving. Severity:  Moderate. Timing:  Intermittent. Duration:  Transient. Context:  Exacerbation of underlying MDD secondary to substance abuse and substance-induced mood disorder with likely etiology being opiate influence and life stressors.  Past Psychiatric History: Past Medical History  Diagnosis Date  . Hyperlipidemia   . MRSA (methicillin resistant staph aureus) culture positive   . Hypertension   . Hypothyroidism     reports that he quit smoking about 20 years ago. He does not have any smokeless tobacco history on file. He reports that he uses illicit drugs. He reports that he does not drink alcohol. History reviewed. No pertinent family history. Family History Substance Abuse: No Family Supports: Yes, List: (siblings and adult children, wife-does not live w/wife) Living Arrangements: Alone Can pt return to current living arrangement?: Yes Allergies:   Allergies  Allergen Reactions  .  Hydrocodone Nausea Only    ACT Assessment Complete:  Yes:    Educational Status    Risk to Self: Risk to self with the past 6 months Suicidal Ideation: Yes-Currently Present Suicidal Intent: Yes-Currently Present Is patient at risk for suicide?:  Yes Suicidal Plan?: No Access to Means: No What has been your use of drugs/alcohol within the last 12 months?: none reported Previous Attempts/Gestures: No How many times?: 0 Other Self Harm Risks: none reported Triggers for Past Attempts: None known Intentional Self Injurious Behavior: None Family Suicide History: No Recent stressful life event(s): Conflict (Comment), Financial Problems, Recent negative physical changes (son being sent prison tommorow for drug related charges.) Persecutory voices/beliefs?: No Depression: Yes Depression Symptoms: Feeling worthless/self pity, Feeling angry/irritable Substance abuse history and/or treatment for substance abuse?: Yes Suicide prevention information given to non-admitted patients: Not applicable  Risk to Others: Risk to Others within the past 6 months Homicidal Ideation: No Thoughts of Harm to Others: No Current Homicidal Intent: No Current Homicidal Plan: No Access to Homicidal Means: No Identified Victim: na History of harm to others?: No Assessment of Violence: None Noted Violent Behavior Description: None Reported Does patient have access to weapons?: No Criminal Charges Pending?: No Does patient have a court date: No  Abuse: Abuse/Neglect Assessment (Assessment to be complete while patient is alone) Physical Abuse: Denies Verbal Abuse: Denies Sexual Abuse: Denies Exploitation of patient/patient's resources: Denies Self-Neglect: Denies  Prior Inpatient Therapy: Prior Inpatient Therapy Prior Inpatient Therapy: No Prior Therapy Dates: na Prior Therapy Facilty/Provider(s): na Reason for Treatment: na  Prior Outpatient Therapy: Prior Outpatient Therapy Prior Outpatient Therapy: Yes Prior Therapy Dates: 6-12yr ago Prior Therapy Facilty/Provider(s): pt can't remember the name Reason for Treatment: Stipulation of Pain Clinc Management  Additional Information: Additional Information 1:1 In Past 12 Months?: No CIRT Risk:  No Elopement Risk: No Does patient have medical clearance?: Yes                  Objective: Blood pressure 133/81, pulse 55, temperature 98.7 F (37.1 C), temperature source Oral, resp. rate 16, height _0  (1.854 m), weight 106.595 kg (235 lb), SpO2 98 %.Body mass index is 31.01 kg/(m^2). Results for orders placed or performed during the hospital encounter of 03/26/14 (from the past 72 hour(s))  Basic metabolic panel     Status: Abnormal   Collection Time: 03/26/14 12:28 PM  Result Value Ref Range   Sodium 139 135 - 145 mmol/L    Comment: Please note change in reference range.   Potassium 4.3 3.5 - 5.1 mmol/L    Comment: Please note change in reference range.   Chloride 106 96 - 112 mEq/L   CO2 28 19 - 32 mmol/L   Glucose, Bld 102 (H) 70 - 99 mg/dL   BUN 12 6 - 23 mg/dL   Creatinine, Ser 0.95 0.50 - 1.35 mg/dL   Calcium 9.5 8.4 - 10.5 mg/dL   GFR calc non Af Amer 85 (L) >90 mL/min   GFR calc Af Amer >90 >90 mL/min    Comment: (NOTE) The eGFR has been calculated using the CKD EPI equation. This calculation has not been validated in all clinical situations. eGFR's persistently <90 mL/min signify possible Chronic Kidney Disease.    Anion gap 5 5 - 15  CBC with Differential     Status: Abnormal   Collection Time: 03/26/14 12:28 PM  Result Value Ref Range   WBC 4.6 4.0 - 10.5 K/uL   RBC 4.46 4.22 -  5.81 MIL/uL   Hemoglobin 11.9 (L) 13.0 - 17.0 g/dL   HCT 36.2 (L) 39.0 - 52.0 %   MCV 81.2 78.0 - 100.0 fL   MCH 26.7 26.0 - 34.0 pg   MCHC 32.9 30.0 - 36.0 g/dL   RDW 15.3 11.5 - 15.5 %   Platelets 216 150 - 400 K/uL   Neutrophils Relative % 66 43 - 77 %   Neutro Abs 3.0 1.7 - 7.7 K/uL   Lymphocytes Relative 24 12 - 46 %   Lymphs Abs 1.1 0.7 - 4.0 K/uL   Monocytes Relative 9 3 - 12 %   Monocytes Absolute 0.4 0.1 - 1.0 K/uL   Eosinophils Relative 0 0 - 5 %   Eosinophils Absolute 0.0 0.0 - 0.7 K/uL   Basophils Relative 1 0 - 1 %   Basophils Absolute 0.0 0.0 -  0.1 K/uL  Ethanol     Status: None   Collection Time: 03/26/14 12:28 PM  Result Value Ref Range   Alcohol, Ethyl (B) <5 0 - 9 mg/dL    Comment:        LOWEST DETECTABLE LIMIT FOR SERUM ALCOHOL IS 11 mg/dL FOR MEDICAL PURPOSES ONLY   Urine rapid drug screen (hosp performed)     Status: Abnormal   Collection Time: 03/26/14  4:40 PM  Result Value Ref Range   Opiates POSITIVE (A) NONE DETECTED   Cocaine NONE DETECTED NONE DETECTED   Benzodiazepines NONE DETECTED NONE DETECTED   Amphetamines NONE DETECTED NONE DETECTED   Tetrahydrocannabinol NONE DETECTED NONE DETECTED   Barbiturates NONE DETECTED NONE DETECTED    Comment:        DRUG SCREEN FOR MEDICAL PURPOSES ONLY.  IF CONFIRMATION IS NEEDED FOR ANY PURPOSE, NOTIFY LAB WITHIN 5 DAYS.        LOWEST DETECTABLE LIMITS FOR URINE DRUG SCREEN Drug Class       Cutoff (ng/mL) Amphetamine      1000 Barbiturate      200 Benzodiazepine   141 Tricyclics       030 Opiates          300 Cocaine          300 THC              50   Protime-INR     Status: Abnormal   Collection Time: 03/26/14  7:04 PM  Result Value Ref Range   Prothrombin Time 29.9 (H) 11.6 - 15.2 seconds   INR 2.82 (H) 0.00 - 1.49  APTT     Status: Abnormal   Collection Time: 03/26/14  7:04 PM  Result Value Ref Range   aPTT 49 (H) 24 - 37 seconds    Comment:        IF BASELINE aPTT IS ELEVATED, SUGGEST PATIENT RISK ASSESSMENT BE USED TO DETERMINE APPROPRIATE ANTICOAGULANT THERAPY.   MRSA PCR Screening     Status: None   Collection Time: 03/26/14 10:52 PM  Result Value Ref Range   MRSA by PCR NEGATIVE NEGATIVE    Comment:        The GeneXpert MRSA Assay (FDA approved for NASAL specimens only), is one component of a comprehensive MRSA colonization surveillance program. It is not intended to diagnose MRSA infection nor to guide or monitor treatment for MRSA infections.   Urinalysis, Routine w reflex microscopic     Status: None   Collection Time: 03/26/14  10:55 PM  Result Value Ref Range   Color, Urine YELLOW YELLOW  APPearance CLEAR CLEAR   Specific Gravity, Urine 1.020 1.005 - 1.030   pH 7.0 5.0 - 8.0   Glucose, UA NEGATIVE NEGATIVE mg/dL   Hgb urine dipstick NEGATIVE NEGATIVE   Bilirubin Urine NEGATIVE NEGATIVE   Ketones, ur NEGATIVE NEGATIVE mg/dL   Protein, ur NEGATIVE NEGATIVE mg/dL   Urobilinogen, UA 0.2 0.0 - 1.0 mg/dL   Nitrite NEGATIVE NEGATIVE   Leukocytes, UA NEGATIVE NEGATIVE    Comment: MICROSCOPIC NOT DONE ON URINES WITH NEGATIVE PROTEIN, BLOOD, LEUKOCYTES, NITRITE, OR GLUCOSE <1000 mg/dL.  CBC     Status: Abnormal   Collection Time: 03/27/14  6:19 AM  Result Value Ref Range   WBC 3.5 (L) 4.0 - 10.5 K/uL   RBC 4.06 (L) 4.22 - 5.81 MIL/uL   Hemoglobin 10.8 (L) 13.0 - 17.0 g/dL   HCT 33.0 (L) 39.0 - 52.0 %   MCV 81.3 78.0 - 100.0 fL   MCH 26.6 26.0 - 34.0 pg   MCHC 32.7 30.0 - 36.0 g/dL   RDW 15.3 11.5 - 15.5 %   Platelets 173 150 - 400 K/uL  Comprehensive metabolic panel     Status: Abnormal   Collection Time: 03/27/14  6:19 AM  Result Value Ref Range   Sodium 137 135 - 145 mmol/L    Comment: Please note change in reference range.   Potassium 3.7 3.5 - 5.1 mmol/L    Comment: Please note change in reference range.   Chloride 108 96 - 112 mEq/L   CO2 23 19 - 32 mmol/L   Glucose, Bld 99 70 - 99 mg/dL   BUN 12 6 - 23 mg/dL   Creatinine, Ser 0.83 0.50 - 1.35 mg/dL   Calcium 8.9 8.4 - 10.5 mg/dL   Total Protein 6.5 6.0 - 8.3 g/dL   Albumin 3.9 3.5 - 5.2 g/dL   AST 17 0 - 37 U/L   ALT 21 0 - 53 U/L   Alkaline Phosphatase 79 39 - 117 U/L   Total Bilirubin 0.5 0.3 - 1.2 mg/dL   GFR calc non Af Amer 90 (L) >90 mL/min   GFR calc Af Amer >90 >90 mL/min    Comment: (NOTE) The eGFR has been calculated using the CKD EPI equation. This calculation has not been validated in all clinical situations. eGFR's persistently <90 mL/min signify possible Chronic Kidney Disease.    Anion gap 6 5 - 15  Protime-INR     Status:  Abnormal   Collection Time: 03/27/14  6:19 AM  Result Value Ref Range   Prothrombin Time 28.1 (H) 11.6 - 15.2 seconds   INR 2.60 (H) 0.00 - 1.49  Protime-INR     Status: Abnormal   Collection Time: 03/28/14  7:10 AM  Result Value Ref Range   Prothrombin Time 22.7 (H) 11.6 - 15.2 seconds   INR 1.98 (H) 0.00 - 1.49   Labs are reviewed and are pertinent for UDS + opiates.  Current Facility-Administered Medications  Medication Dose Route Frequency Provider Last Rate Last Dose  . 0.9 %  sodium chloride infusion   Intravenous Continuous Waldemar Dickens, MD 100 mL/hr at 03/27/14 1635    . buprenorphine (SUBUTEX) SL tablet 4 mg  4 mg Sublingual Q30 min Phillips Odor, MD      . buprenorphine (SUBUTEX) SL tablet 8 mg  8 mg Sublingual Daily Phillips Odor, MD   0 mg at 03/28/14 1210  . cloNIDine (CATAPRES) tablet 0.1 mg  0.1 mg Oral BH-qamhs Nat Christen, MD  Followed by  . [START ON 03/31/2014] cloNIDine (CATAPRES) tablet 0.1 mg  0.1 mg Oral QAC breakfast Nat Christen, MD      . diazepam (VALIUM) tablet 2 mg  2 mg Oral Q8H PRN Waldemar Dickens, MD   2 mg at 03/27/14 1504  . dicyclomine (BENTYL) capsule 20 mg  20 mg Oral Q6H PRN Nat Christen, MD      . diltiazem (CARDIZEM CD) 24 hr capsule 180 mg  180 mg Oral Daily Nita Sells, MD   180 mg at 03/28/14 1001  . feeding supplement (ENSURE COMPLETE) (ENSURE COMPLETE) liquid 237 mL  237 mL Oral BID BM Waldemar Dickens, MD   237 mL at 03/28/14 1002  . hydrOXYzine (ATARAX/VISTARIL) tablet 25 mg  25 mg Oral Q6H PRN Nat Christen, MD   25 mg at 03/27/14 1301  . levothyroxine (SYNTHROID, LEVOTHROID) tablet 150 mcg  150 mcg Oral QAC breakfast Waldemar Dickens, MD   150 mcg at 03/28/14 0813  . loperamide (IMODIUM) capsule 2-4 mg  2-4 mg Oral PRN Nat Christen, MD   4 mg at 03/28/14 0813  . methocarbamol (ROBAXIN) tablet 500 mg  500 mg Oral Q8H PRN Nat Christen, MD   500 mg at 03/27/14 2111  . naproxen (NAPROSYN) tablet 500 mg  500 mg Oral BID PRN Nat Christen, MD    500 mg at 03/27/14 1639  . ondansetron (ZOFRAN-ODT) disintegrating tablet 4 mg  4 mg Oral Q6H PRN Nat Christen, MD      . pantoprazole (PROTONIX) EC tablet 80 mg  80 mg Oral Daily Waldemar Dickens, MD   80 mg at 03/28/14 1002  . pregabalin (LYRICA) capsule 75 mg  75 mg Oral BID Phillips Odor, MD   75 mg at 03/27/14 2200  . sertraline (ZOLOFT) tablet 200 mg  200 mg Oral Daily Waldemar Dickens, MD   200 mg at 03/28/14 1001  . sodium chloride 0.9 % injection 3 mL  3 mL Intravenous Q12H Waldemar Dickens, MD   3 mL at 03/26/14 2103  . sucralfate (CARAFATE) tablet 1 g  1 g Oral QID Waldemar Dickens, MD   1 g at 03/28/14 1001  . tamsulosin (FLOMAX) capsule 0.4 mg  0.4 mg Oral QPC supper Waldemar Dickens, MD   0.4 mg at 03/27/14 1635  . Warfarin - Pharmacist Dosing Inpatient   Does not apply Q24H Waldemar Dickens, MD        Psychiatric Specialty Exam:     Blood pressure 133/81, pulse 55, temperature 98.7 F (37.1 C), temperature source Oral, resp. rate 16, height _0  (1.854 m), weight 106.595 kg (235 lb), SpO2 98 %.Body mass index is 31.01 kg/(m^2).  General Appearance: Casual and Fairly Groomed  Engineer, water::  Good  Speech:  Clear and Coherent  Volume:  Normal  Mood:  Euthymic  Affect:  Appropriate and Congruent  Thought Process:  Coherent and Goal Directed  Orientation:  Full (Time, Place, and Person)  Thought Content:  WDL  Suicidal Thoughts:  No  Homicidal Thoughts:  No  Memory:  Immediate;   Fair Recent;   Fair Remote;   Fair  Judgement:  Fair  Insight:  Good  Psychomotor Activity:  Normal  Concentration:  Good  Recall:  Good  Akathisia:  No  Handed:    AIMS (if indicated):     Assets:  Communication Skills Desire for Improvement Resilience Social Support  Sleep:      Treatment Plan  Summary: Discharge home when medically cleared.  -Pt reports he will followup with counseling at [facility], with whom he has an existing therapeutic relationship.   Disposition: As  above  *Case reviewed with Dr. Parke Poisson.   Benjamine Mola, FNP-BC 03/28/2014 12:34 PM   Case reviewed with me as above

## 2014-03-28 NOTE — Progress Notes (Signed)
Kenneth Rhodes UMP:[id number] DOB: [birth year] DOA: 03/26/2014 PCP: Lysbeth Penner, FNP  Brief narrative: 69 year old male status complicated spinal surgery with multiple other issues admitted to [facility] requesting detox.  Psychiatry consulted for telemetry psych evaluation He states that his issues date back to about 2008 when he had spinal fusion surgery at [facility] under Dr. Jeanette Caprice. He subsequently had an epidural steroid injection sometime earlier this year [records not available for us] and he contracted MRSA which spread to his spine and had to have an emergent neurosurgical procedure done under Dr. Walden Field of [place] in [place] .   the rest is as per history of present illness  Past medical history-As per Problem list Chart reviewed as below- No records, in process of obtaining  Consultants:  None currently  Procedures:  None  Antibiotics:  None   Subjective   Fair 2 episodes diarrhea today A little tremulous No other issue   Objective    Interim History:   Telemetry: Sinus    Objective: Filed Vitals:   03/27/14 1329 03/27/14 2110 03/28/14 1004 03/28/14 1414  BP: 123/72 106/59 133/81 116/66  Pulse: 52 47 55 58  Temp: 98.3 F (36.8 C) 98.2 F (36.8 C) 98.7 F (37.1 C) 98.8 F (37.1 C)  TempSrc: Oral Oral Oral Oral  Resp: 20 16 16 16   Height:      Weight:      SpO2: 98% 96% 98% 99%    Intake/Output Summary (Last 24 hours) at 03/28/14 1517 Last data filed at 03/28/14 1244  Gross per 24 hour  Intake   1920 ml  Output      0 ml  Net   1920 ml    Exam:  General: Alert pleasant not overtly in pain Cardiovascular: S1-S2 no murmur rub or gallop Respiratory: Clinically clear\ Able to lift both Lower extremities up off of bed  Data Reviewed: Basic Metabolic Panel:  Recent Labs Lab 03/26/14 1228 03/27/14 0619  NA 139 137  K 4.3 3.7  CL 106 108  CO2 28 23  GLUCOSE 102* 99  BUN 12 12  CREATININE 0.95 0.83  CALCIUM  9.5 8.9   Liver Function Tests:  Recent Labs Lab 03/27/14 0619  AST 17  ALT 21  ALKPHOS 79  BILITOT 0.5  PROT 6.5  ALBUMIN 3.9   No results for input(s): LIPASE, AMYLASE in the last 168 hours. No results for input(s): AMMONIA in the last 168 hours. CBC:  Recent Labs Lab 03/26/14 1228 03/27/14 0619  WBC 4.6 3.5*  NEUTROABS 3.0  --   HGB 11.9* 10.8*  HCT 36.2* 33.0*  MCV 81.2 81.3  PLT 216 173   Cardiac Enzymes: No results for input(s): CKTOTAL, CKMB, CKMBINDEX, TROPONINI in the last 168 hours. BNP: Invalid input(s): POCBNP CBG: No results for input(s): GLUCAP in the last 168 hours.  Recent Results (from the past 240 hour(s))  MRSA PCR Screening     Status: None   Collection Time: 03/26/14 10:52 PM  Result Value Ref Range Status   MRSA by PCR NEGATIVE NEGATIVE Final    Comment:        The GeneXpert MRSA Assay (FDA approved for NASAL specimens only), is one component of a comprehensive MRSA colonization surveillance program. It is not intended to diagnose MRSA infection nor to guide or monitor treatment for MRSA infections.      Studies:              All Imaging reviewed and is  as per above notation   Scheduled Meds: . buprenorphine  8 mg Sublingual Daily  . cloNIDine  0.1 mg Oral BH-qamhs   Followed by  . [START ON 03/31/2014] cloNIDine  0.1 mg Oral QAC breakfast  . diltiazem  180 mg Oral Daily  . feeding supplement (ENSURE COMPLETE)  237 mL Oral BID BM  . levothyroxine  150 mcg Oral QAC breakfast  . pantoprazole  80 mg Oral Daily  . pregabalin  75 mg Oral BID  . sertraline  200 mg Oral Daily  . sodium chloride  3 mL Intravenous Q12H  . sucralfate  1 g Oral QID  . tamsulosin  0.4 mg Oral QPC supper  . Warfarin - Pharmacist Dosing Inpatient   Does not apply Q24H   Continuous Infusions: . sodium chloride 100 mL/hr at 03/27/14 1635     Assessment/Plan: 1. Opiate use secondary to chronic low back pain with withdrawal-titrate Suboxone 8mg  till no  withdrawal symptoms. CLonidine taper completed 2.  major depression without overt suicidality or plan-telemetry psychiatry states no SI/HI or plan fso doesn't meet admit criterio, continues sertraline 200 daily 3. Urinary retention-? BPH-patient has been passing urine. We will not administer urinary catheter at this time. 4. Hypertension continue diltiazem 180 daily 5. Lumbar radiculopathy-changed Gabapentin to lyrica 75 bid 6. Hypothyroid-etiology likely secondary to depression of HPA axis with chronic pain. Continue synthroid 150 mcg 7. Chronic anticoagulation, coumadin d/c this admit  Code Status:  Full No family at bedside Inpatient pending withdrawal symtpom relief   Verneita Griffes, MD  [facility] Pager [phone] 03/28/2014, 3:17 PM    LOS: 2 days

## 2014-03-28 NOTE — Progress Notes (Signed)
Patient medically is stable.  Further dispo per Telepsychiatry   Kenneth Griffes, MD [facility] [phone]

## 2014-03-28 NOTE — Progress Notes (Signed)
Patient ID: Kenneth Rhodes, male   DOB: 06-23-1945, 69 y.o.   MRN: [id number]  Kenneth Rhodes Laughter, MD     [email or web address]          [name] is an 69 y.o. male.   Assessment/Plan: 1. Chronic lumbosacral radiculopathy status post multiple back surgeries.  2. Chronic low back pain.  3. New onset opioid use disorder.  4. Mild gait impairment as a result of epidural abscess.  The patient did have a low withdrawal symptoms overnight with the frequent bouts of diarrhea, increased pain, restlessness and some sweating. The patient was monitored for over 2 hour while induction of buprenorphine was carried out. The patient was given 4 mg followed by another 4 mg for about 40 minutes. The patient reports only minimal improvement. He was given additional 4 mg with significant improvement in symptoms. His pain level Down to a 5. He had resolution of diarrhea and other symptoms abated.Side effects are reported.   We did contact his insurance company and setup prior authorization. The patient is received 12 mg tomorrow in approximately 12 and he can be discharged home. He is a follow-up in the office in 2 weeks. I did contact the patient's pharmacy [facility] in [place]. A verbal order was placed for Suboxone 12/3 #13.    GENERAL: Pleasant man in no acute distress.  HEENT: Supple. Atraumatic normocephalic.   ABDOMEN: soft  EXTREMITIES: No edema. Right knee replacement.   BACK: Normal.  SKIN: Normal by inspection.   MENTAL STATUS: Alert and oriented. Speech, language and cognition are generally intact. Judgment and insight normal.   CRANIAL NERVES: Pupils are equal, round and reactive to light and accommodation; extra ocular movements are full, there is no significant nystagmus; visual fields are full; upper and lower facial muscles are normal in strength and symmetric, there is no flattening of the nasolabial folds; tongue is midline; uvula is midline;  shoulder elevation is normal.  MOTOR: Normal tone, bulk and strength; no pronator drift.  COORDINATION: Left finger to nose is normal, right finger to nose is normal, No rest tremor; no intention tremor; no postural tremor; no bradykinesia.  REFLEXES: Deep tendon reflexes are symmetrical and normal.   SENSATION: Reduced to light touch and temperature involving the entire right lower extremity.     Objective: Vital signs in last 24 hours: Temp:  [98.2 F (36.8 C)-98.8 F (37.1 C)] 98.8 F (37.1 C) (01/08 1414) Pulse Rate:  [47-58] 58 (01/08 1414) Resp:  [16] 16 (01/08 1414) BP: (106-133)/(59-81) 116/66 mmHg (01/08 1414) SpO2:  [96 %-99 %] 99 % (01/08 1414)  Intake/Output from previous day: 01/07 0701 - 01/08 0700 In: 2160 [P.O.:960; I.V.:1200] Out: 550 [Urine:550] Intake/Output this shift: Total I/O In: 360 [P.O.:360] Out: -  Nutritional status:     Lab Results: Results for orders placed or performed during the hospital encounter of 03/26/14 (from the past 48 hour(s))  Urine rapid drug screen (hosp performed)     Status: Abnormal   Collection Time: 03/26/14  4:40 PM  Result Value Ref Range   Opiates POSITIVE (A) NONE DETECTED   Cocaine NONE DETECTED NONE DETECTED   Benzodiazepines NONE DETECTED NONE DETECTED   Amphetamines NONE DETECTED NONE DETECTED   Tetrahydrocannabinol NONE DETECTED NONE DETECTED   Barbiturates NONE DETECTED NONE DETECTED    Comment:        DRUG SCREEN FOR MEDICAL PURPOSES ONLY.  IF CONFIRMATION IS NEEDED FOR ANY PURPOSE, NOTIFY LAB WITHIN 5 DAYS.  LOWEST DETECTABLE LIMITS FOR URINE DRUG SCREEN Drug Class       Cutoff (ng/mL) Amphetamine      1000 Barbiturate      200 Benzodiazepine   536 Tricyclics       468 Opiates          300 Cocaine          300 THC              50   Protime-INR     Status: Abnormal   Collection Time: 03/26/14  7:04 PM  Result Value Ref Range   Prothrombin Time 29.9 (H) 11.6 - 15.2 seconds   INR 2.82  (H) 0.00 - 1.49  APTT     Status: Abnormal   Collection Time: 03/26/14  7:04 PM  Result Value Ref Range   aPTT 49 (H) 24 - 37 seconds    Comment:        IF BASELINE aPTT IS ELEVATED, SUGGEST PATIENT RISK ASSESSMENT BE USED TO DETERMINE APPROPRIATE ANTICOAGULANT THERAPY.   MRSA PCR Screening     Status: None   Collection Time: 03/26/14 10:52 PM  Result Value Ref Range   MRSA by PCR NEGATIVE NEGATIVE    Comment:        The GeneXpert MRSA Assay (FDA approved for NASAL specimens only), is one component of a comprehensive MRSA colonization surveillance program. It is not intended to diagnose MRSA infection nor to guide or monitor treatment for MRSA infections.   Urinalysis, Routine w reflex microscopic     Status: None   Collection Time: 03/26/14 10:55 PM  Result Value Ref Range   Color, Urine YELLOW YELLOW   APPearance CLEAR CLEAR   Specific Gravity, Urine 1.020 1.005 - 1.030   pH 7.0 5.0 - 8.0   Glucose, UA NEGATIVE NEGATIVE mg/dL   Hgb urine dipstick NEGATIVE NEGATIVE   Bilirubin Urine NEGATIVE NEGATIVE   Ketones, ur NEGATIVE NEGATIVE mg/dL   Protein, ur NEGATIVE NEGATIVE mg/dL   Urobilinogen, UA 0.2 0.0 - 1.0 mg/dL   Nitrite NEGATIVE NEGATIVE   Leukocytes, UA NEGATIVE NEGATIVE    Comment: MICROSCOPIC NOT DONE ON URINES WITH NEGATIVE PROTEIN, BLOOD, LEUKOCYTES, NITRITE, OR GLUCOSE <1000 mg/dL.  CBC     Status: Abnormal   Collection Time: 03/27/14  6:19 AM  Result Value Ref Range   WBC 3.5 (L) 4.0 - 10.5 K/uL   RBC 4.06 (L) 4.22 - 5.81 MIL/uL   Hemoglobin 10.8 (L) 13.0 - 17.0 g/dL   HCT 33.0 (L) 39.0 - 52.0 %   MCV 81.3 78.0 - 100.0 fL   MCH 26.6 26.0 - 34.0 pg   MCHC 32.7 30.0 - 36.0 g/dL   RDW 15.3 11.5 - 15.5 %   Platelets 173 150 - 400 K/uL  Comprehensive metabolic panel     Status: Abnormal   Collection Time: 03/27/14  6:19 AM  Result Value Ref Range   Sodium 137 135 - 145 mmol/L    Comment: Please note change in reference range.   Potassium 3.7 3.5 - 5.1  mmol/L    Comment: Please note change in reference range.   Chloride 108 96 - 112 mEq/L   CO2 23 19 - 32 mmol/L   Glucose, Bld 99 70 - 99 mg/dL   BUN 12 6 - 23 mg/dL   Creatinine, Ser 0.83 0.50 - 1.35 mg/dL   Calcium 8.9 8.4 - 10.5 mg/dL   Total Protein 6.5 6.0 - 8.3 g/dL   Albumin  3.9 3.5 - 5.2 g/dL   AST 17 0 - 37 U/L   ALT 21 0 - 53 U/L   Alkaline Phosphatase 79 39 - 117 U/L   Total Bilirubin 0.5 0.3 - 1.2 mg/dL   GFR calc non Af Amer 90 (L) >90 mL/min   GFR calc Af Amer >90 >90 mL/min    Comment: (NOTE) The eGFR has been calculated using the CKD EPI equation. This calculation has not been validated in all clinical situations. eGFR's persistently <90 mL/min signify possible Chronic Kidney Disease.    Anion gap 6 5 - 15  Protime-INR     Status: Abnormal   Collection Time: 03/27/14  6:19 AM  Result Value Ref Range   Prothrombin Time 28.1 (H) 11.6 - 15.2 seconds   INR 2.60 (H) 0.00 - 1.49  Protime-INR     Status: Abnormal   Collection Time: 03/28/14  7:10 AM  Result Value Ref Range   Prothrombin Time 22.7 (H) 11.6 - 15.2 seconds   INR 1.98 (H) 0.00 - 1.49    Lipid Panel No results for input(s): CHOL, TRIG, HDL, CHOLHDL, VLDL, LDLCALC in the last 72 hours.  Studies/Results:   Medications:  Scheduled Meds: . buprenorphine  8 mg Sublingual Daily  . cloNIDine  0.1 mg Oral BH-qamhs   Followed by  . [START ON 03/31/2014] cloNIDine  0.1 mg Oral QAC breakfast  . diltiazem  180 mg Oral Daily  . feeding supplement (ENSURE COMPLETE)  237 mL Oral BID BM  . levothyroxine  150 mcg Oral QAC breakfast  . pantoprazole  80 mg Oral Daily  . pregabalin  75 mg Oral BID  . sertraline  200 mg Oral Daily  . sodium chloride  3 mL Intravenous Q12H  . sucralfate  1 g Oral QID  . tamsulosin  0.4 mg Oral QPC supper  . Warfarin - Pharmacist Dosing Inpatient   Does not apply Q24H   Continuous Infusions: . sodium chloride 100 mL/hr at 03/27/14 1635   PRN Meds:.diazepam, dicyclomine,  hydrOXYzine, loperamide, methocarbamol, naproxen, ondansetron     LOS: 2 days   Prapti Grussing A. Rhodes Laughter, M.D.  Diplomate, Tax adviser of Psychiatry and Neurology ( Neurology).

## 2014-03-28 NOTE — Progress Notes (Addendum)
Per Dr. Freddie Apley request, called [employer] for prior authorization for Suboxone 12-3.  Spoke with [place], [phone].  Pt's ID [id number].  She is faxing the prior authorization request to me to be completed.  Prior Authorization forms filled out, awaiting Dr. Freddie Apley signature.  Then will [facility] them back to [facility].

## 2014-03-28 NOTE — Evaluation (Signed)
Occupational Therapy Evaluation Patient Details Name: Kenneth Rhodes MRN: [id number] DOB: 14-Nov-1945 Today's Date: 03/28/2014    History of Present Illness Pt is a 69 year old male Pt presenting trying to detox from opioids. [place] and got hooked on opioids. Spent several months in rehab for MSK/nerve issues. Pt has been at home for past 2 mo and taking increasing amounts of opioids and becoming increasingly depressed. Pt endorses taking up to 20mg  Oxycodone every 4 hours and 100mg  Fentanyl patch daily and chewing it after taking it off his skin. His supply has been running low lately so he's been bying more off the street and recently started using Heroine. Reports using up to 1 gram of heroine when oral narcotics not available. Pt feels like he's going in to withdrawal - diaphoresis, spasms, tremors. Denies CP, palpitations, syncope. Reports thoughts of suicide and hopelessness but no plan to carry out thoughts of suicide. Denies HI.    Clinical Impression   Pt presenting to acute OT with above situation.  He presents with WFL ROM, strength, and coordination in BUE.  Pt reports modified independence with ADLs at baseline, and demonstrates abilities to continue modified independence after d/c.  Pt has no concerns about ADL engagement after d/c home.  No further acute OT services needed at this this - recommend follow-up for opiate withdrawal.      Follow Up Recommendations  No OT follow up    Equipment Recommendations  None recommended by OT    Recommendations for Other Services       Precautions / Restrictions Precautions Precautions: Fall Precaution Comments: Hx of Rt LE numbness from back surgery last year Restrictions Weight Bearing Restrictions: No      Mobility Bed Mobility Overal bed mobility: Independent                Transfers                      Balance Overall balance assessment: No apparent balance  deficits (not formally assessed)                                          ADL Overall ADL's : At baseline;Modified independent                                             Vision                     Perception     Praxis      Pertinent Vitals/Pain Pain Assessment: 0-10 Pain Score: 10-Worst pain ever Pain Location: "everywhere" Pain Intervention(s): Limited activity within patient's tolerance;Utilized relaxation techniques;Monitored during session     Hand Dominance Right   Extremity/Trunk Assessment Upper Extremity Assessment Upper Extremity Assessment: Overall WFL for tasks assessed   Lower Extremity Assessment Lower Extremity Assessment: Defer to PT evaluation       Communication Communication Communication: No difficulties   Cognition Arousal/Alertness: Awake/alert Behavior During Therapy: WFL for tasks assessed/performed Overall Cognitive Status: Within Functional Limits for tasks assessed                     General Comments  Exercises       Shoulder Instructions      Home Living Family/patient expects to be discharged to:: Private residence Living Arrangements: Alone Available Help at Discharge: Family;Friend(s);Available PRN/intermittently (Sister will be available at d/c) Type of Home: Apartment Home Access: Level entry     Home Layout: One level     Bathroom Shower/Tub: Teacher, early years/pre: Standard (uses BSC over toilet)     Home Equipment: Walker - 2 wheels;Cane - single point;Wheelchair - [employer];Shower seat;Hand held shower head          Prior Functioning/Environment Level of Independence: Independent with assistive device(s)  Gait / Transfers Assistance Needed: Mod (I) wth bed mobility skills, transfers, and household gait with use of std cane in Lt hand.  Pt repors use of electric scooter around grocery store/[employer] ADL's / Homemaking  Assistance Needed: Pt reports modified independence with all ADLs needs.   Comments: Pt has someone drive him secondary to hx of Rt LE numbness. Pt will drive fo short distances.    OT Diagnosis:     OT Problem List:     OT Treatment/Interventions:      OT Goals(Current goals can be found in the care plan section) Acute Rehab OT Goals Patient Stated Goal: No OT goals needed OT Goal Formulation: With patient  OT Frequency:     Barriers to D/C:            Co-evaluation              End of Session    Activity Tolerance: Patient tolerated treatment well Patient left: in bed;with nursing/sitter in room;with call bell/phone within reach   Time: 1062-6948 OT Time Calculation (min): 9 min Charges:  OT General Charges $OT Visit: 1 Procedure OT Evaluation $Initial OT Evaluation Tier I: 1 Procedure G-Codes:     Bea Graff Donnah Levert, MS, OTR/L [place] [phone] 03/28/2014, 8:46 AM

## 2014-03-28 NOTE — Progress Notes (Signed)
Per verbal order from Dr. Verlon Au, ok to discontinue suicide sitter.

## 2014-03-28 NOTE — Progress Notes (Signed)
Patient's daughter called to check in on patient.  Informed her that patient had an uneventful night.  She expressed to me concern that patient could just check himself out and walk away, when he really needs treatment.

## 2014-03-28 NOTE — Progress Notes (Signed)
Pt. HR 58, no runny nose and last BM was around 1130 this morning.  Spoke with Dr. Merlene Laughter, received verbal order to administer 4mg  of Subutex now and 4mg  in 30 min.  Then call Dr. Merlene Laughter for further instructions.

## 2014-03-28 NOTE — Progress Notes (Signed)
ANTICOAGULATION CONSULT NOTE - Initial Consult  Pharmacy Consult for Coumadin (chronic Rx PTA) Indication: Unknown   Allergies  Allergen Reactions  . Hydrocodone Nausea Only   Patient Measurements: Height: 6\' 1"  (185.4 cm) Weight: 235 lb (106.595 kg) IBW/kg (Calculated) : 79.9  Vital Signs: Temp: 98.7 F (37.1 C) (01/08 1004) Temp Source: Oral (01/08 1004) BP: 133/81 mmHg (01/08 1004) Pulse Rate: 55 (01/08 1004)  Labs:  Recent Labs  03/26/14 1228 03/26/14 1904 03/27/14 0619 03/28/14 0710  HGB 11.9*  --  10.8*  --   HCT 36.2*  --  33.0*  --   PLT 216  --  173  --   APTT  --  49*  --   --   LABPROT  --  29.9* 28.1* 22.7*  INR  --  2.82* 2.60* 1.98*  CREATININE 0.95  --  0.83  --    Estimated Creatinine Clearance: 112.2 mL/min (by C-G formula based on Cr of 0.83).  Medical History: Past Medical History  Diagnosis Date  . Hyperlipidemia   . MRSA (methicillin resistant staph aureus) culture positive   . Hypertension   . Hypothyroidism    Medications:  Prescriptions prior to admission  Medication Sig Dispense Refill Last Dose  . baclofen (LIORESAL) 10 MG tablet Take 1 tablet (10 mg total) by mouth 3 (three) times daily. 30 each 3 03/26/2014 at Unknown time  . diltiazem (DILACOR XR) 240 MG 24 hr capsule Take 1 capsule (240 mg total) by mouth daily. 30 capsule 0 03/25/2014 at Unknown time  . fentaNYL (DURAGESIC - DOSED MCG/HR) 100 MCG/HR Place 1 patch (100 mcg total) onto the skin every 3 (three) days. 10 patch 0 03/23/2014  . levothyroxine (SYNTHROID, LEVOTHROID) 150 MCG tablet Take 150 mcg by mouth daily.  0 03/26/2014 at Unknown time  . omeprazole (PRILOSEC) 40 MG capsule Take 1 capsule (40 mg total) by mouth daily. 30 capsule 11 03/26/2014 at Unknown time  . oxybutynin (DITROPAN) 5 MG tablet Take 10 mg by mouth at bedtime.  0 03/25/2014 at Unknown time  . oxyCODONE-acetaminophen (PERCOCET) 10-325 MG per tablet Take 1 tablet by mouth every 6 (six) hours as needed. (Patient  taking differently: Take 1 tablet by mouth 3 (three) times daily as needed for pain. ) 120 tablet 0 03/26/2014 at Unknown time  . sertraline (ZOLOFT) 100 MG tablet Take 200 mg by mouth daily.   0 03/25/2014 at Unknown time  . sucralfate (CARAFATE) 1 G tablet Take 1 tablet (1 g total) by mouth 4 (four) times daily. 120 tablet 3 03/26/2014 at Unknown time  . tamsulosin (FLOMAX) 0.4 MG CAPS capsule Take 0.4 mg by mouth daily after supper.   0 03/25/2014 at Unknown time  . warfarin (COUMADIN) 5 MG tablet Take 1-2 tablets (5-10 mg total) by mouth daily at 6 PM. (Patient taking differently: Take 2.5-5 mg by mouth See admin instructions. 5 mg Monday-Friday.  2.5 mg on Saturday and sunday) 60 tablet 0 03/25/2014 at Unknown time   Assessment: 69yo male on chronic Coumadin PTA.  Per MD, indication is unclear.  Pt placed on Warfarin postop last August in [place]. Denies h/o DVT, PE, or afib.  Pt is poor historian.   INR was therapeutic on admission, but has trended below goal range today likely due to low dose given on admission day prior to home regimen being known.   No bleeding noted.    Goal of Therapy:  INR 2-3   Plan:   Repeat Coumadin  7.5mg  po today x 1  INR daily  Biagio Borg 03/28/2014,12:29 PM

## 2014-03-29 ENCOUNTER — Other Ambulatory Visit: Payer: Self-pay | Admitting: Family Medicine

## 2014-03-29 DIAGNOSIS — F1129 Opioid dependence with unspecified opioid-induced disorder: Secondary | ICD-10-CM

## 2014-03-29 LAB — PROTIME-INR
INR: 1.73 — AB (ref 0.00–1.49)
Prothrombin Time: 20.4 seconds — ABNORMAL HIGH (ref 11.6–15.2)

## 2014-03-29 MED ORDER — CLONIDINE HCL 0.1 MG PO TABS: ORAL_TABLET | ORAL | Status: DC

## 2014-03-29 MED ORDER — BUPRENORPHINE HCL 2 MG SL SUBL
12.0000 mg | SUBLINGUAL_TABLET | Freq: Once | SUBLINGUAL | Status: AC
  Administered 2014-03-29: 12 mg via SUBLINGUAL
  Filled 2014-03-29: qty 6

## 2014-03-29 MED ORDER — WARFARIN SODIUM 5 MG PO TABS
5.0000 mg | ORAL_TABLET | Freq: Once | ORAL | Status: AC
  Administered 2014-03-29: 5 mg via ORAL
  Filled 2014-03-29: qty 1

## 2014-03-29 MED ORDER — PREGABALIN 75 MG PO CAPS: 75.0000 mg | ORAL_CAPSULE | Freq: Two times a day (BID) | ORAL | Status: DC

## 2014-03-29 NOTE — Progress Notes (Signed)
Notified by patient that he still was unable to void. Patient was bladder scanned and bladder scan revealed 200cc. Patients only complain is pressure in the abdomen. MD was notified. Will continue to give the patient more time to void. Patient attempted to void and was able to. Patient was unsure of how much he voided but stated that he felt much better. Rechecked bladder scan and bladder scan after voiding showed 0cc. MD was made aware

## 2014-03-29 NOTE — Progress Notes (Signed)
PROGRESS NOTE  Kenneth Rhodes GUY:[id number] DOB: [birth year] DOA: 03/26/2014 PCP: Lysbeth Penner, FNP  Summary: 69 year old man with chronic pain who presented with history of increasing use of opiates, fentanyl patch daily, chewing patch after taking off his skin, buying street drugs because his supply prescribed medications was low, started using heroin. Presented with symptoms of withdrawal and suicidal thoughts. He was admitted for opiate withdrawal and placed on clonidine taper. Opiate which are all improved, he was seen in consultation with neurology/pain management and the decision was made to initiate buprenorphine therapy. He was seen by telepsychiatry for depression and cleared for discharge home.  Assessment/Plan: 1. Opiate withdrawal. Opiate abuse as an outpatient including oxycodone, heroin, fentanyl patches (chewing on them after use). Started on Suboxone per neurology/pain management. 2. Opioid use disorder. 3. Chronic back pain, lumbar radiculopathy, status post multiple back surgeries, chronic since spinal MRSA infection and surgery August. Permanent right leg numbness and intermittent weakness with mild gait impairment. Very well controlled at this point. 4. Suicidal ideation without plan. Major depression, recurrent severe; substance abuse and substance induced mood disorder. Seen by psychiatry and cleared for discharge home. Patient not interested in counseling. 5. Urinary retention. History of the same on and off since spinal surgery, history of indwelling Foley catheter. Resolved at this point. 6. Chronic anticoagulation. Apparently started on warfarin around the time of spinal surgery, no history of DVT or PE. This has been discontinued as there is no compelling indication to continue. 7. Obesity.   Feeling well. Pain very well controlled. Plan discharge home today.  Follow-up pain management with Dr. Merlene Laughter.  Patient will go to his pharmacy tomorrow to pick up  one-2 day supply which will cost less than $40. He reports being able to do this. Hopefully preauthorization will go through the next 2 days.  Follow-up normocytic anemia as an outpatient as clinically indicated.  Discussed with family at bedside.  Murray Hodgkins, MD  [facility]  Pager [phone] If 7PM-7AM, please contact night-coverage at [email or web address], password [facility] 03/29/2014, 4:28 PM  LOS: 3 days   Consultants:  Neurology/pain management  Physical therapy: No follow-up needed.  Occupational therapy: No follow-up  Procedures:    Antibiotics:    HPI/Subjective: Feeling good. Pain very well controlled. No which are all symptoms. Eating well.   Objective: Filed Vitals:   03/28/14 2051 03/28/14 2211 03/29/14 0428 03/29/14 1320  BP: 123/63 116/78 145/77 119/68  Pulse: 54 53 45 59  Temp: 98.2 F (36.8 C)  98.4 F (36.9 C) 98.9 F (37.2 C)  TempSrc: Oral  Oral Oral  Resp: 20  20 20   Height:      Weight:      SpO2: 97% 98% 98% 95%    Intake/Output Summary (Last 24 hours) at 03/29/14 1628 Last data filed at 03/29/14 1230  Gross per 24 hour  Intake   1453 ml  Output   1000 ml  Net    453 ml     Filed Weights   03/26/14 1129  Weight: 106.595 kg (235 lb)    Exam:     Afebrile, vital signs stable. No hypoxia. General:  Appears calm and comfortable Cardiovascular: RRR, no m/r/g. No LE edema. Telemetry: SR Respiratory: CTA bilaterally, no w/r/r. Normal respiratory effort. Psychiatric: grossly normal mood and affect, speech fluent and appropriate  Data Reviewed:  Complete metabolic panel unremarkable 1/7  Hemoglobin 10.8; 1/7  Scheduled Meds: . cloNIDine  0.1 mg Oral BH-qamhs   Followed by  . [  START ON 03/31/2014] cloNIDine  0.1 mg Oral QAC breakfast  . diltiazem  180 mg Oral Daily  . feeding supplement (ENSURE COMPLETE)  237 mL Oral BID BM  . levothyroxine  150 mcg Oral QAC breakfast  . pantoprazole  80 mg Oral Daily  . pregabalin  75 mg  Oral BID  . sertraline  200 mg Oral Daily  . sodium chloride  3 mL Intravenous Q12H  . sucralfate  1 g Oral QID  . tamsulosin  0.4 mg Oral QPC supper  . warfarin  5 mg Oral Once  . Warfarin - Pharmacist Dosing Inpatient   Does not apply Q24H   Continuous Infusions: . sodium chloride 100 mL/hr at 03/29/14 0867    Principal Problem:   Opiate withdrawal Active Problems:   Opiate addiction   Depression, major, recurrent   Suicidal ideation   Chronic pain   Lumbar radiculopathy, chronic   Hypothyroidism   BPH (benign prostatic hyperplasia)   Bladder spasm

## 2014-03-29 NOTE — Progress Notes (Signed)
Princess Bruins Charlet discharged home per MD order.  Discharge instructions reviewed and discussed with the patient, all questions and concerns answered. Copy of instructions and scripts given to patient.    Medication List    STOP taking these medications        baclofen 10 MG tablet  Commonly known as:  LIORESAL     diltiazem 240 MG 24 hr capsule  Commonly known as:  DILACOR XR     fentaNYL 100 MCG/HR  Commonly known as:  DURAGESIC - dosed mcg/hr     oxyCODONE-acetaminophen 10-325 MG per tablet  Commonly known as:  PERCOCET     warfarin 5 MG tablet  Commonly known as:  COUMADIN      TAKE these medications        cloNIDine 0.1 MG tablet  Commonly known as:  CATAPRES  Take one tablet morning 1/10, one tablet morning 1/11, one tablet morning 1/12.     levothyroxine 150 MCG tablet  Commonly known as:  SYNTHROID, LEVOTHROID  Take 150 mcg by mouth daily.     omeprazole 40 MG capsule  Commonly known as:  PRILOSEC  Take 1 capsule (40 mg total) by mouth daily.     oxybutynin 5 MG tablet  Commonly known as:  DITROPAN  Take 10 mg by mouth at bedtime.     pregabalin 75 MG capsule  Commonly known as:  LYRICA  Take 1 capsule (75 mg total) by mouth 2 (two) times daily.     sertraline 100 MG tablet  Commonly known as:  ZOLOFT  Take 200 mg by mouth daily.     sucralfate 1 G tablet  Commonly known as:  CARAFATE  Take 1 tablet (1 g total) by mouth 4 (four) times daily.     tamsulosin 0.4 MG Caps capsule  Commonly known as:  FLOMAX  Take 0.4 mg by mouth daily after supper.        Patients skin is clean, dry and intact, no evidence of skin break down. IV site discontinued and catheter remains intact. Site without signs and symptoms of complications. Dressing and pressure applied.  Patient escorted to car by Lovena Le, RN in a wheelchair,  no distress noted upon discharge.  Regino Bellow 03/29/2014 7:15 PM

## 2014-03-29 NOTE — Progress Notes (Signed)
Patient anxious about having Suboxone when discharged home.  The prior authorization forms were filled out, signed by Dr. Merlene Laughter, and faxed to [facility] last night.  This morning, [employer] called to confirm the forms were received.  This RN confirmed that they were received, filled out and faxed to the number provided.  This RN called [facility] in [place] to verify that there is nothing else they need from [place].  Korrin, pharmacist, confirmed they are waiting to hear from [facility].  This RN called [facility] without success as they are currently closed.

## 2014-03-29 NOTE — Progress Notes (Signed)
Notified by patient that he felt pressure in his lower abdomen and patient was unable to void. Upon assessment patients abdomen was distended. Bladder scan showed 581cc. MD was notified. MD ordered an In and Out cath. In and Out cath was performed by myself and Letta Median, NT. Patient tolerated In and Out cath with no complaints. In and Out revealed 600cc of urine. Will continue to monitor the patient at this time.

## 2014-03-29 NOTE — Discharge Summary (Signed)
Physician Discharge Summary  Kenneth Rhodes YTK:[id number] DOB: Jan 11, 1946 DOA: 03/26/2014  PCP: Kenneth Penner, FNP  Admit date: 03/26/2014 Discharge date: 03/29/2014  Recommendations for Outpatient Follow-up:  1. Chronic pain, opiate use disorder. Started on buprenorphine 12 mg SL daily per neurology, Rx per neurology. 2. Major depression. Recommend counseling but the patient is not interested. 3. Intermittent urinary retention, chronic. Patient plans to follow-up with his urologist next week and will contact him directly. 4. Warfarin stopped given no compelling indication. No history of DVT or PE. Patient is ambulatory. 5. Follow-up normocytic anemia   Follow-up Information    Follow up with Kenneth Penner, FNP.   Specialty:  Family Medicine   Why:  As needed   Contact information:   [place] [address] [phone]       Follow up with [facility], KOFI, MD. Schedule an appointment as soon as possible for a visit in 2 weeks.   Specialty:  Neurology   Contact information:   [address] [place] [address] [phone]      Discharge Diagnoses:  1. Opiate withdrawal. 2. Opiate use disorder. 3. Chronic back pain, lumbar radiculopathy 4. Suicidal ideation 5. Intermittent urinary retention  Discharge Condition: improved Disposition: home  Diet recommendation: regular  Filed Weights   03/26/14 1129  Weight: 106.595 kg (235 lb)    History of present illness:  69 year old man with chronic pain who presented with history of increasing use of opiates, fentanyl patch daily, chewing patch after taking off his skin, buying street drugs because his supply of prescribed medications was low and started using heroin. Presented with symptoms of withdrawal and suicidal thoughts.  Hospital Course:  [name] was admitted for opiate withdrawal and placed on clonidine taper. Opiate withdrawal resolved, he was seen in consultation with neurology/pain  management and the decision was made to initiate buprenorphine therapy. He was seen by telepsychiatry for depression and cleared for discharge home. He was evaluated by physical therapy and has no needs. Individual issues as below.  1. Opiate withdrawal. Opiate abuse as an outpatient including oxycodone, heroin, fentanyl patches (chewing on them after use). Started on Suboxone per neurology/pain management. 2. Opioid use disorder. 3. Chronic back pain, lumbar radiculopathy, status post multiple back surgeries, chronic since spinal MRSA infection and surgery August. Permanent right leg numbness and intermittent weakness with mild gait impairment. Very well controlled at this point. 4. Suicidal ideation without plan. Major depression, recurrent severe; substance abuse and substance induced mood disorder. Seen by psychiatry and cleared for discharge home. Patient not interested in counseling. 5. Urinary retention. History of the same on and off since spinal surgery, history of indwelling Foley catheter. Resolved at this point. 6. Chronic anticoagulation. Apparently started on warfarin around the time of spinal surgery, no history of DVT or PE. This has been discontinued as there is no compelling indication to continue. 7. Obesity.   Follow-up pain management with Dr. Merlene Laughter.  Patient will go to his pharmacy tomorrow to pick up one-2 day supply which will cost less than $40. He reports being able to do this. Hopefully preauthorization will go through the next 2 days.  Follow-up normocytic anemia as an outpatient as clinically indicated.  Discharge Instructions  Discharge Instructions    Activity as tolerated - No restrictions    Complete by:  As directed      Diet - low sodium heart healthy    Complete by:  As directed  Discharge instructions    Complete by:  As directed   Call your physician or seek immediate medical attention for increased pain, nausea, vomiting or worsening of  condition.          Current Discharge Medication List    START taking these medications   Details  cloNIDine (CATAPRES) 0.1 MG tablet Take one tablet morning 1/10, one tablet morning 1/11, one tablet morning 1/12. Qty: 3 tablet, Refills: 0    pregabalin (LYRICA) 75 MG capsule Take 1 capsule (75 mg total) by mouth 2 (two) times daily. Qty: 60 capsule, Refills: 0      CONTINUE these medications which have NOT CHANGED   Details  levothyroxine (SYNTHROID, LEVOTHROID) 150 MCG tablet Take 150 mcg by mouth daily. Refills: 0    omeprazole (PRILOSEC) 40 MG capsule Take 1 capsule (40 mg total) by mouth daily. Qty: 30 capsule, Refills: 11   Associated Diagnoses: Gastroesophageal reflux disease with esophagitis    oxybutynin (DITROPAN) 5 MG tablet Take 10 mg by mouth at bedtime. Refills: 0    sertraline (ZOLOFT) 100 MG tablet Take 200 mg by mouth daily.  Refills: 0    sucralfate (CARAFATE) 1 G tablet Take 1 tablet (1 g total) by mouth 4 (four) times daily. Qty: 120 tablet, Refills: 3   Associated Diagnoses: Gastroesophageal reflux disease with esophagitis    tamsulosin (FLOMAX) 0.4 MG CAPS capsule Take 0.4 mg by mouth daily after supper.  Refills: 0      STOP taking these medications     baclofen (LIORESAL) 10 MG tablet      diltiazem (DILACOR XR) 240 MG 24 hr capsule      fentaNYL (DURAGESIC - DOSED MCG/HR) 100 MCG/HR      oxyCODONE-acetaminophen (PERCOCET) 10-325 MG per tablet      warfarin (COUMADIN) 5 MG tablet        Allergies  Allergen Reactions  . Hydrocodone Nausea Only    The results of significant diagnostics from this hospitalization (including imaging, microbiology, ancillary and laboratory) are listed below for reference.    Significant Diagnostic Studies: No results found.  Microbiology: Recent Results (from the past 240 hour(s))  MRSA PCR Screening     Status: None   Collection Time: 03/26/14 10:52 PM  Result Value Ref Range Status   MRSA by PCR  NEGATIVE NEGATIVE Final    Comment:        The GeneXpert MRSA Assay (FDA approved for NASAL specimens only), is one component of a comprehensive MRSA colonization surveillance program. It is not intended to diagnose MRSA infection nor to guide or monitor treatment for MRSA infections.      Labs: Basic Metabolic Panel:  Recent Labs Lab 03/26/14 1228 03/27/14 0619  NA 139 137  K 4.3 3.7  CL 106 108  CO2 28 23  GLUCOSE 102* 99  BUN 12 12  CREATININE 0.95 0.83  CALCIUM 9.5 8.9   Liver Function Tests:  Recent Labs Lab 03/27/14 0619  AST 17  ALT 21  ALKPHOS 79  BILITOT 0.5  PROT 6.5  ALBUMIN 3.9   CBC:  Recent Labs Lab 03/26/14 1228 03/27/14 0619  WBC 4.6 3.5*  NEUTROABS 3.0  --   HGB 11.9* 10.8*  HCT 36.2* 33.0*  MCV 81.2 81.3  PLT 216 173    Principal Problem:   Opiate withdrawal Active Problems:   Opiate addiction   Depression, major, recurrent   Suicidal ideation   Chronic pain   Lumbar radiculopathy, chronic  Hypothyroidism   BPH (benign prostatic hyperplasia)   Bladder spasm   Time coordinating discharge: 40 minutes  Signed:  Murray Hodgkins, MD [facility] 03/29/2014, 5:16 PM

## 2014-03-29 NOTE — Progress Notes (Signed)
[facility] for Coumadin (chronic Rx PTA) Indication: Unknown   Allergies  Allergen Reactions  . Hydrocodone Nausea Only   Patient Measurements: Height: 6\' 1"  (185.4 cm) Weight: 235 lb (106.595 kg) IBW/kg (Calculated) : 79.9  Vital Signs: Temp: 98.4 F (36.9 C) (01/09 0428) Temp Source: Oral (01/09 0428) BP: 145/77 mmHg (01/09 0428) Pulse Rate: 45 (01/09 0428)  Labs:  Recent Labs  03/26/14 1228  03/26/14 1904 03/27/14 0619 03/28/14 0710 03/29/14 0605  HGB 11.9*  --   --  10.8*  --   --   HCT 36.2*  --   --  33.0*  --   --   PLT 216  --   --  173  --   --   APTT  --   --  49*  --   --   --   LABPROT  --   < > 29.9* 28.1* 22.7* 20.4*  INR  --   < > 2.82* 2.60* 1.98* 1.73*  CREATININE 0.95  --   --  0.83  --   --   < > = values in this interval not displayed. Estimated Creatinine Clearance: 112.2 mL/min (by C-G formula based on Cr of 0.83).  Medical History: Past Medical History  Diagnosis Date  . Hyperlipidemia   . MRSA (methicillin resistant staph aureus) culture positive   . Hypertension   . Hypothyroidism    Medications:  Prescriptions prior to admission  Medication Sig Dispense Refill Last Dose  . baclofen (LIORESAL) 10 MG tablet Take 1 tablet (10 mg total) by mouth 3 (three) times daily. 30 each 3 03/26/2014 at Unknown time  . diltiazem (DILACOR XR) 240 MG 24 hr capsule Take 1 capsule (240 mg total) by mouth daily. 30 capsule 0 03/25/2014 at Unknown time  . fentaNYL (DURAGESIC - DOSED MCG/HR) 100 MCG/HR Place 1 patch (100 mcg total) onto the skin every 3 (three) days. 10 patch 0 03/23/2014  . levothyroxine (SYNTHROID, LEVOTHROID) 150 MCG tablet Take 150 mcg by mouth daily.  0 03/26/2014 at Unknown time  . omeprazole (PRILOSEC) 40 MG capsule Take 1 capsule (40 mg total) by mouth daily. 30 capsule 11 03/26/2014 at Unknown time  . oxybutynin (DITROPAN) 5 MG tablet Take 10 mg by mouth at bedtime.  0 03/25/2014 at Unknown time  .  oxyCODONE-acetaminophen (PERCOCET) 10-325 MG per tablet Take 1 tablet by mouth every 6 (six) hours as needed. (Patient taking differently: Take 1 tablet by mouth 3 (three) times daily as needed for pain. ) 120 tablet 0 03/26/2014 at Unknown time  . sertraline (ZOLOFT) 100 MG tablet Take 200 mg by mouth daily.   0 03/25/2014 at Unknown time  . sucralfate (CARAFATE) 1 G tablet Take 1 tablet (1 g total) by mouth 4 (four) times daily. 120 tablet 3 03/26/2014 at Unknown time  . tamsulosin (FLOMAX) 0.4 MG CAPS capsule Take 0.4 mg by mouth daily after supper.   0 03/25/2014 at Unknown time  . warfarin (COUMADIN) 5 MG tablet Take 1-2 tablets (5-10 mg total) by mouth daily at 6 PM. (Patient taking differently: Take 2.5-5 mg by mouth See admin instructions. 5 mg Monday-Friday.  2.5 mg on Saturday and sunday) 60 tablet 0 03/25/2014 at Unknown time   Assessment: 69yo male on chronic Coumadin PTA.  Per MD, indication is unclear.  Pt placed on Warfarin postop last August in [place]. Denies h/o DVT, PE, or afib.  Pt is poor historian.   INR was  therapeutic on admission, but has trended below goal range today likely due to low dose given on admission day prior to home regimen being known, but continues to decline despite increase in warfarin dose yesterday.   No bleeding noted.    Goal of Therapy:  INR 2-3   Plan:   Coumadin 5mg  po today x 1  INR daily  Biagio Borg 03/29/2014,11:03 AM

## 2014-03-31 ENCOUNTER — Other Ambulatory Visit: Payer: Self-pay | Admitting: Family Medicine

## 2014-04-01 DIAGNOSIS — R3919 Other difficulties with micturition: Secondary | ICD-10-CM | POA: Diagnosis not present

## 2014-04-01 DIAGNOSIS — R339 Retention of urine, unspecified: Secondary | ICD-10-CM | POA: Diagnosis not present

## 2014-04-02 ENCOUNTER — Encounter: Payer: Self-pay | Admitting: Family

## 2014-04-02 ENCOUNTER — Ambulatory Visit (INDEPENDENT_AMBULATORY_CARE_PROVIDER_SITE_OTHER): Payer: Medicare Other | Admitting: Family

## 2014-04-02 VITALS — BP 133/81 | HR 71 | Temp 98.5°F | Ht 73.0 in | Wt 240.4 lb

## 2014-04-02 DIAGNOSIS — Z1321 Encounter for screening for nutritional disorder: Secondary | ICD-10-CM

## 2014-04-02 DIAGNOSIS — F331 Major depressive disorder, recurrent, moderate: Secondary | ICD-10-CM

## 2014-04-02 DIAGNOSIS — Z23 Encounter for immunization: Secondary | ICD-10-CM

## 2014-04-02 DIAGNOSIS — G8929 Other chronic pain: Secondary | ICD-10-CM

## 2014-04-02 DIAGNOSIS — Z0000 Encounter for general adult medical examination without abnormal findings: Secondary | ICD-10-CM | POA: Diagnosis not present

## 2014-04-02 DIAGNOSIS — E039 Hypothyroidism, unspecified: Secondary | ICD-10-CM | POA: Diagnosis not present

## 2014-04-02 DIAGNOSIS — R079 Chest pain, unspecified: Secondary | ICD-10-CM | POA: Diagnosis not present

## 2014-04-02 DIAGNOSIS — N400 Enlarged prostate without lower urinary tract symptoms: Secondary | ICD-10-CM | POA: Diagnosis not present

## 2014-04-02 DIAGNOSIS — E559 Vitamin D deficiency, unspecified: Secondary | ICD-10-CM | POA: Diagnosis not present

## 2014-04-02 NOTE — Patient Instructions (Signed)

## 2014-04-02 NOTE — Progress Notes (Signed)
Subjective:    Patient ID: Kenneth Rhodes, male    DOB: 13-May-1945, 69 y.o.   MRN: [id number]  Thyroid Problem Presents for follow-up visit. Symptoms include anxiety and depressed mood. Patient reports no constipation, dry skin, fatigue, hoarse voice, leg swelling or palpitations. The symptoms have been stable. Past treatments include levothyroxine. The treatment provided moderate relief.  Gastrophageal Reflux He complains of chest pain. He reports no coughing, no globus sensation, no hoarse voice, no nausea, no sore throat or no wheezing. This is a chronic problem. The current episode started more than 1 year ago. The problem occurs rarely. The problem has been unchanged. The symptoms are aggravated by lying down and certain foods. Pertinent negatives include no fatigue. He has tried a PPI for the symptoms. The treatment provided moderate relief.  Hypertension This is a chronic problem. Associated symptoms include anxiety and chest pain. Pertinent negatives include no palpitations. Risk factors for coronary artery disease include male gender and obesity. Past treatments include calcium channel blockers. The current treatment provides moderate improvement. Hypertensive end-organ damage includes a thyroid problem. There is no history of kidney disease, CAD/MI or CVA.  Anxiety Presents for follow-up visit. Symptoms include chest pain, depressed mood and nervous/anxious behavior. Patient reports no insomnia, irritability, nausea, palpitations or restlessness. Symptoms occur rarely.   His past medical history is significant for anxiety/panic attacks and depression. Past treatments include SSRIs. The treatment provided significant relief.  Benign Prostatic Hypertrophy This is a chronic problem. The current episode started more than 1 year ago. The problem is unchanged. Irritative symptoms include urgency. Irritative symptoms do not include frequency. Pt to have surgery 04/09/14 on prostate- for BPH.  Obstructive symptoms include incomplete emptying, an intermittent stream, straining and a weak stream. Obstructive symptoms do not include dribbling. Pertinent negatives include no hesitancy, nausea or vomiting.  Chest Pain  This is a new problem. The current episode started 1 to 4 weeks ago. The onset quality is gradual. The problem occurs constantly. The pain is present in the lateral region and epigastric region. The pain is at a severity of 5/10. The pain is mild. Quality: aching. The pain radiates to the epigastrium. Pertinent negatives include no cough, nausea, palpitations or vomiting. The pain is aggravated by exertion and movement. He has tried nothing for the symptoms. The treatment provided no relief.  His past medical history is significant for anxiety/panic attacks, hypertension and thyroid problem.  Pertinent negatives for past medical history include no MI.      Review of Systems  Constitutional: Negative.  Negative for irritability and fatigue.  HENT: Negative.  Negative for hoarse voice and sore throat.   Respiratory: Negative.  Negative for cough and wheezing.   Cardiovascular: Positive for chest pain. Negative for palpitations.  Gastrointestinal: Negative.  Negative for nausea, vomiting and constipation.  Endocrine: Negative.   Genitourinary: Positive for urgency and incomplete emptying. Negative for hesitancy and frequency.  Musculoskeletal: Negative.   Neurological: Negative.   Hematological: Negative.   Psychiatric/Behavioral: The patient is nervous/anxious. The patient does not have insomnia.   All other systems reviewed and are negative.      Objective:   Physical Exam  Constitutional: He is oriented to person, place, and time. He appears well-developed and well-nourished. No distress.  HENT:  Head: Normocephalic.  Right Ear: External ear normal.  Left Ear: External ear normal.  Mouth/Throat: Oropharynx is clear and moist.  Eyes: Pupils are equal, round, and  reactive to light. Right eye exhibits  no discharge. Left eye exhibits no discharge.  Neck: Normal range of motion. Neck supple. No thyromegaly present.  Cardiovascular: Normal rate, regular rhythm, normal heart sounds and intact distal pulses.   No murmur heard. Pulmonary/Chest: Effort normal and breath sounds normal. No respiratory distress. He has no wheezes.  Abdominal: Soft. Bowel sounds are normal. He exhibits no distension. There is no tenderness.  Musculoskeletal: Normal range of motion. He exhibits no edema or tenderness.  Neurological: He is alert and oriented to person, place, and time. He has normal reflexes. No cranial nerve deficit.  Skin: Skin is warm and dry. No rash noted. No erythema.  Psychiatric: He has a normal mood and affect. His behavior is normal. Judgment and thought content normal.  Vitals reviewed.  EKG- WNL   BP 133/81 mmHg  Pulse 71  Temp(Src) 98.5 F (36.9 C) (Oral)  Ht $R'6\' 1"'oR$  (1.854 m)  Wt 240 lb 6.4 oz (109.045 kg)  BMI 31.72 kg/m2     Assessment & Plan:  1. Hypothyroidism, unspecified hypothyroidism type - CMP14+EGFR - Thyroid Panel With TSH  2. BPH (benign prostatic hyperplasia) - CMP14+EGFR  3. Major depressive disorder, recurrent episode, moderate - CMP14+EGFR  4. Chronic pain - CMP14+EGFR  5. Chest pain, unspecified chest pain type - CMP14+EGFR - EKG 12-Lead; Standing - EKG 12-Lead  6. Encounter for vitamin deficiency screening - CMP14+EGFR - Vit D  25 hydroxy (rtn osteoporosis monitoring)  7. Annual physical exam - Lipid panel - Vit D  25 hydroxy (rtn osteoporosis monitoring)   Continue all meds Labs pending Health Maintenance reviewed-Pneumococcal vaccine given today Diet and exercise encouraged RTO 3 months  Evelina Dun, FNP

## 2014-04-03 LAB — CMP14+EGFR
ALT: 22 IU/L (ref 0–44)
AST: 15 IU/L (ref 0–40)
Albumin/Globulin Ratio: 1.5 (ref 1.1–2.5)
Albumin: 4.2 g/dL (ref 3.6–4.8)
Alkaline Phosphatase: 109 IU/L (ref 39–117)
BILIRUBIN TOTAL: 0.3 mg/dL (ref 0.0–1.2)
BUN / CREAT RATIO: 11 (ref 10–22)
BUN: 11 mg/dL (ref 8–27)
CALCIUM: 9.4 mg/dL (ref 8.6–10.2)
CO2: 27 mmol/L (ref 18–29)
Chloride: 102 mmol/L (ref 97–108)
Creatinine, Ser: 0.96 mg/dL (ref 0.76–1.27)
GFR calc Af Amer: 95 mL/min/{1.73_m2} (ref >59)
GFR, EST NON AFRICAN AMERICAN: 82 mL/min/{1.73_m2} (ref >59)
GLUCOSE: 83 mg/dL (ref 65–99)
Globulin, Total: 2.8 g/dL (ref 1.5–4.5)
Potassium: 4.7 mmol/L (ref 3.5–5.2)
SODIUM: 140 mmol/L (ref 134–144)
TOTAL PROTEIN: 7 g/dL (ref 6.0–8.5)

## 2014-04-03 LAB — LIPID PANEL
CHOLESTEROL TOTAL: 107 mg/dL (ref 100–199)
Chol/HDL Ratio: 2.3 ratio units (ref 0.0–5.0)
HDL: 47 mg/dL (ref >39)
LDL Calculated: 42 mg/dL (ref 0–99)
Triglycerides: 92 mg/dL (ref 0–149)
VLDL CHOLESTEROL CAL: 18 mg/dL (ref 5–40)

## 2014-04-03 LAB — THYROID PANEL WITH TSH
Free Thyroxine Index: 2.4 (ref 1.2–4.9)
T3 Uptake Ratio: 31 % (ref 24–39)
T4 TOTAL: 7.8 ug/dL (ref 4.5–12.0)
TSH: 2.31 u[IU]/mL (ref 0.450–4.500)

## 2014-04-03 LAB — VITAMIN D 25 HYDROXY (VIT D DEFICIENCY, FRACTURES): Vit D, 25-Hydroxy: 18.6 ng/mL — ABNORMAL LOW (ref 30.0–100.0)

## 2014-04-05 DIAGNOSIS — I10 Essential (primary) hypertension: Secondary | ICD-10-CM | POA: Diagnosis not present

## 2014-04-05 DIAGNOSIS — E039 Hypothyroidism, unspecified: Secondary | ICD-10-CM | POA: Diagnosis not present

## 2014-04-05 DIAGNOSIS — R339 Retention of urine, unspecified: Secondary | ICD-10-CM | POA: Diagnosis not present

## 2014-04-05 DIAGNOSIS — Z79899 Other long term (current) drug therapy: Secondary | ICD-10-CM | POA: Diagnosis not present

## 2014-04-05 DIAGNOSIS — Z8614 Personal history of Methicillin resistant Staphylococcus aureus infection: Secondary | ICD-10-CM | POA: Diagnosis not present

## 2014-04-07 ENCOUNTER — Ambulatory Visit: Payer: Medicare Other | Admitting: Family

## 2014-04-07 DIAGNOSIS — R339 Retention of urine, unspecified: Secondary | ICD-10-CM | POA: Diagnosis not present

## 2014-04-07 DIAGNOSIS — M25511 Pain in right shoulder: Secondary | ICD-10-CM | POA: Diagnosis not present

## 2014-04-07 DIAGNOSIS — R109 Unspecified abdominal pain: Secondary | ICD-10-CM | POA: Diagnosis not present

## 2014-04-07 DIAGNOSIS — Z79899 Other long term (current) drug therapy: Secondary | ICD-10-CM | POA: Diagnosis not present

## 2014-04-07 DIAGNOSIS — Z22322 Carrier or suspected carrier of Methicillin resistant Staphylococcus aureus: Secondary | ICD-10-CM | POA: Diagnosis not present

## 2014-04-07 DIAGNOSIS — Z87891 Personal history of nicotine dependence: Secondary | ICD-10-CM | POA: Diagnosis not present

## 2014-04-07 DIAGNOSIS — R1010 Upper abdominal pain, unspecified: Secondary | ICD-10-CM | POA: Diagnosis not present

## 2014-04-07 DIAGNOSIS — I10 Essential (primary) hypertension: Secondary | ICD-10-CM | POA: Diagnosis not present

## 2014-04-07 DIAGNOSIS — R079 Chest pain, unspecified: Secondary | ICD-10-CM | POA: Diagnosis not present

## 2014-04-07 DIAGNOSIS — E039 Hypothyroidism, unspecified: Secondary | ICD-10-CM | POA: Diagnosis not present

## 2014-04-07 DIAGNOSIS — R319 Hematuria, unspecified: Secondary | ICD-10-CM | POA: Diagnosis not present

## 2014-04-08 ENCOUNTER — Other Ambulatory Visit: Payer: Self-pay | Admitting: Family Medicine

## 2014-04-08 DIAGNOSIS — M25511 Pain in right shoulder: Secondary | ICD-10-CM | POA: Diagnosis not present

## 2014-04-09 DIAGNOSIS — R3912 Poor urinary stream: Secondary | ICD-10-CM | POA: Diagnosis not present

## 2014-04-09 DIAGNOSIS — R338 Other retention of urine: Secondary | ICD-10-CM | POA: Diagnosis not present

## 2014-04-09 DIAGNOSIS — R3911 Hesitancy of micturition: Secondary | ICD-10-CM | POA: Diagnosis not present

## 2014-04-09 DIAGNOSIS — Z8614 Personal history of Methicillin resistant Staphylococcus aureus infection: Secondary | ICD-10-CM | POA: Diagnosis not present

## 2014-04-09 DIAGNOSIS — Z880 Allergy status to penicillin: Secondary | ICD-10-CM | POA: Diagnosis not present

## 2014-04-09 DIAGNOSIS — E039 Hypothyroidism, unspecified: Secondary | ICD-10-CM | POA: Diagnosis not present

## 2014-04-09 DIAGNOSIS — R339 Retention of urine, unspecified: Secondary | ICD-10-CM | POA: Diagnosis not present

## 2014-04-09 DIAGNOSIS — Z885 Allergy status to narcotic agent status: Secondary | ICD-10-CM | POA: Diagnosis not present

## 2014-04-09 DIAGNOSIS — Z79899 Other long term (current) drug therapy: Secondary | ICD-10-CM | POA: Diagnosis not present

## 2014-04-09 DIAGNOSIS — G35 Multiple sclerosis: Secondary | ICD-10-CM | POA: Diagnosis not present

## 2014-04-09 DIAGNOSIS — Z87828 Personal history of other (healed) physical injury and trauma: Secondary | ICD-10-CM | POA: Diagnosis not present

## 2014-04-09 DIAGNOSIS — I10 Essential (primary) hypertension: Secondary | ICD-10-CM | POA: Diagnosis not present

## 2014-04-09 DIAGNOSIS — F418 Other specified anxiety disorders: Secondary | ICD-10-CM | POA: Diagnosis not present

## 2014-04-09 DIAGNOSIS — M25511 Pain in right shoulder: Secondary | ICD-10-CM | POA: Diagnosis not present

## 2014-04-09 DIAGNOSIS — R319 Hematuria, unspecified: Secondary | ICD-10-CM | POA: Diagnosis not present

## 2014-04-09 DIAGNOSIS — N401 Enlarged prostate with lower urinary tract symptoms: Secondary | ICD-10-CM | POA: Diagnosis not present

## 2014-04-14 DIAGNOSIS — N411 Chronic prostatitis: Secondary | ICD-10-CM | POA: Diagnosis not present

## 2014-04-15 DIAGNOSIS — M75111 Incomplete rotator cuff tear or rupture of right shoulder, not specified as traumatic: Secondary | ICD-10-CM | POA: Diagnosis not present

## 2014-04-21 DIAGNOSIS — M545 Low back pain: Secondary | ICD-10-CM | POA: Diagnosis not present

## 2014-04-21 DIAGNOSIS — S24104A Unspecified injury at T11-T12 level of thoracic spinal cord, initial encounter: Secondary | ICD-10-CM | POA: Diagnosis not present

## 2014-04-21 DIAGNOSIS — I4891 Unspecified atrial fibrillation: Secondary | ICD-10-CM | POA: Diagnosis not present

## 2014-04-22 DIAGNOSIS — M545 Low back pain: Secondary | ICD-10-CM | POA: Diagnosis not present

## 2014-04-22 DIAGNOSIS — S24104A Unspecified injury at T11-T12 level of thoracic spinal cord, initial encounter: Secondary | ICD-10-CM | POA: Diagnosis not present

## 2014-04-22 DIAGNOSIS — I4891 Unspecified atrial fibrillation: Secondary | ICD-10-CM | POA: Diagnosis not present

## 2014-04-23 DIAGNOSIS — S24104A Unspecified injury at T11-T12 level of thoracic spinal cord, initial encounter: Secondary | ICD-10-CM | POA: Diagnosis not present

## 2014-04-23 DIAGNOSIS — I4891 Unspecified atrial fibrillation: Secondary | ICD-10-CM | POA: Diagnosis not present

## 2014-04-23 DIAGNOSIS — M545 Low back pain: Secondary | ICD-10-CM | POA: Diagnosis not present

## 2014-04-24 DIAGNOSIS — S24104A Unspecified injury at T11-T12 level of thoracic spinal cord, initial encounter: Secondary | ICD-10-CM | POA: Diagnosis not present

## 2014-04-24 DIAGNOSIS — M545 Low back pain: Secondary | ICD-10-CM | POA: Diagnosis not present

## 2014-04-24 DIAGNOSIS — I4891 Unspecified atrial fibrillation: Secondary | ICD-10-CM | POA: Diagnosis not present

## 2014-04-25 DIAGNOSIS — I4891 Unspecified atrial fibrillation: Secondary | ICD-10-CM | POA: Diagnosis not present

## 2014-04-25 DIAGNOSIS — S24104A Unspecified injury at T11-T12 level of thoracic spinal cord, initial encounter: Secondary | ICD-10-CM | POA: Diagnosis not present

## 2014-04-25 DIAGNOSIS — M545 Low back pain: Secondary | ICD-10-CM | POA: Diagnosis not present

## 2014-04-28 ENCOUNTER — Ambulatory Visit (INDEPENDENT_AMBULATORY_CARE_PROVIDER_SITE_OTHER): Payer: Medicare Other | Admitting: Family Medicine

## 2014-04-28 ENCOUNTER — Encounter: Payer: Self-pay | Admitting: Family Medicine

## 2014-04-28 VITALS — BP 145/83 | HR 75 | Temp 97.6°F | Ht 73.0 in | Wt 255.0 lb

## 2014-04-28 DIAGNOSIS — J206 Acute bronchitis due to rhinovirus: Secondary | ICD-10-CM

## 2014-04-28 DIAGNOSIS — I4891 Unspecified atrial fibrillation: Secondary | ICD-10-CM | POA: Diagnosis not present

## 2014-04-28 DIAGNOSIS — M545 Low back pain: Secondary | ICD-10-CM | POA: Diagnosis not present

## 2014-04-28 DIAGNOSIS — S24104A Unspecified injury at T11-T12 level of thoracic spinal cord, initial encounter: Secondary | ICD-10-CM | POA: Diagnosis not present

## 2014-04-28 MED ORDER — METHYLPREDNISOLONE ACETATE 80 MG/ML IJ SUSP
80.0000 mg | Freq: Once | INTRAMUSCULAR | Status: AC
  Administered 2014-04-28: 80 mg via INTRAMUSCULAR

## 2014-04-28 MED ORDER — HYDROCODONE-HOMATROPINE 5-1.5 MG/5ML PO SYRP: 5.0000 mL | ORAL_SOLUTION | Freq: Three times a day (TID) | ORAL | Status: DC | PRN

## 2014-04-28 MED ORDER — METHYLPREDNISOLONE ACETATE 80 MG/ML IJ SUSP: 80.0000 mg | Freq: Once | INTRAMUSCULAR | Status: DC

## 2014-04-28 MED ORDER — AZITHROMYCIN 250 MG PO TABS: ORAL_TABLET | ORAL | Status: DC

## 2014-04-28 NOTE — Progress Notes (Signed)
   Subjective:    Patient ID: Kenneth Rhodes, male    DOB: [birth year], 69 y.o.   MRN: [id number]  HPI Patient is here for c/o uri sx's and persistent cough at night.  Review of Systems  Constitutional: Negative for fever.  HENT: Negative for ear pain.   Eyes: Negative for discharge.  Respiratory: Negative for cough.   Cardiovascular: Negative for chest pain.  Gastrointestinal: Negative for abdominal distention.  Endocrine: Negative for polyuria.  Genitourinary: Negative for difficulty urinating.  Musculoskeletal: Negative for gait problem and neck pain.  Skin: Negative for color change and rash.  Neurological: Negative for speech difficulty and headaches.  Psychiatric/Behavioral: Negative for agitation.       Objective:    BP 145/83 mmHg  Pulse 75  Temp(Src) 97.6 F (36.4 C) (Oral)  Ht 6\' 1"  (1.854 m)  Wt 255 lb (115.667 kg)  BMI 33.65 kg/m2 Physical Exam  Constitutional: He is oriented to person, place, and time. He appears well-developed and well-nourished.  HENT:  Head: Normocephalic and atraumatic.  Mouth/Throat: Oropharynx is clear and moist.  Eyes: Pupils are equal, round, and reactive to light.  Neck: Normal range of motion. Neck supple.  Cardiovascular: Normal rate and regular rhythm.   No murmur heard. Pulmonary/Chest: Effort normal and breath sounds normal.  Abdominal: Soft. Bowel sounds are normal. There is no tenderness.  Neurological: He is alert and oriented to person, place, and time.  Skin: Skin is warm and dry.  Psychiatric: He has a normal mood and affect.          Assessment & Plan:     ICD-9-CM ICD-10-CM   1. Acute bronchitis due to Rhinovirus 466.0 J20.6    079.3     Push po fluids, rest, tylenol and motrin otc prn as directed for fever, arthralgias, and myalgias.  Follow up prn if sx's continue or persist.  No Follow-up on file.  Lysbeth Penner FNP

## 2014-04-28 NOTE — Addendum Note (Signed)
Addended by: Marylin Crosby on: 04/28/2014 05:10 PM   Modules accepted: Orders

## 2014-05-01 ENCOUNTER — Encounter: Payer: Self-pay | Admitting: Family

## 2014-05-01 ENCOUNTER — Ambulatory Visit (INDEPENDENT_AMBULATORY_CARE_PROVIDER_SITE_OTHER): Payer: Medicare Other | Admitting: Family

## 2014-05-01 VITALS — BP 144/85 | HR 80 | Temp 98.1°F | Ht 73.0 in | Wt 250.4 lb

## 2014-05-01 DIAGNOSIS — J209 Acute bronchitis, unspecified: Secondary | ICD-10-CM

## 2014-05-01 MED ORDER — BENZONATATE 200 MG PO CAPS: 200.0000 mg | ORAL_CAPSULE | Freq: Three times a day (TID) | ORAL | Status: DC | PRN

## 2014-05-01 MED ORDER — LEVOFLOXACIN 500 MG PO TABS
500.0000 mg | ORAL_TABLET | Freq: Every day | ORAL | Status: DC
Start: 2014-05-01 — End: 2014-06-04

## 2014-05-01 MED ORDER — METHYLPREDNISOLONE (PAK) 4 MG PO TABS: ORAL_TABLET | ORAL | Status: DC

## 2014-05-01 MED ORDER — ALBUTEROL SULFATE HFA 108 (90 BASE) MCG/ACT IN AERS: 2.0000 | INHALATION_SPRAY | Freq: Four times a day (QID) | RESPIRATORY_TRACT | Status: DC | PRN

## 2014-05-01 NOTE — Patient Instructions (Signed)

## 2014-05-01 NOTE — Progress Notes (Signed)
Subjective:    Patient ID: Kenneth Rhodes, male    DOB: February 12, 1946, 69 y.o.   MRN: [id number]  Cough This is a new problem. The current episode started 1 to 4 weeks ago. The problem has been unchanged. The problem occurs every few minutes. The cough is productive of purulent sputum. Associated symptoms include a fever, myalgias, nasal congestion, rhinorrhea, shortness of breath and wheezing. Pertinent negatives include no chills, ear pain, headaches, hemoptysis or sore throat. The symptoms are aggravated by lying down and exercise. Treatments tried: zpak, mucinex. The treatment provided mild relief. There is no history of asthma or COPD.  Fever  Associated symptoms include coughing and wheezing. Pertinent negatives include no ear pain, headaches or sore throat.      Review of Systems  Constitutional: Positive for fever. Negative for chills.  HENT: Positive for rhinorrhea. Negative for ear pain and sore throat.   Respiratory: Positive for cough, shortness of breath and wheezing. Negative for hemoptysis.   Cardiovascular: Negative.   Gastrointestinal: Negative.   Endocrine: Negative.   Genitourinary: Negative.   Musculoskeletal: Positive for myalgias.  Neurological: Negative.  Negative for headaches.  Hematological: Negative.   Psychiatric/Behavioral: Negative.   All other systems reviewed and are negative.      Objective:   Physical Exam  Constitutional: He is oriented to person, place, and time. He appears well-developed and well-nourished. No distress.  HENT:  Head: Normocephalic.  Right Ear: External ear normal.  Left Ear: External ear normal.  Mouth/Throat: Oropharynx is clear and moist.  Nasal passage erythemas with mild swelling  Oropharynx erythemas   Eyes: Pupils are equal, round, and reactive to light. Right eye exhibits no discharge. Left eye exhibits no discharge.  Neck: Normal range of motion. Neck supple. No thyromegaly present.  Cardiovascular: Normal  rate, regular rhythm, normal heart sounds and intact distal pulses.   No murmur heard. Pulmonary/Chest: Effort normal and breath sounds normal. No respiratory distress. He has no wheezes.  Abdominal: Soft. Bowel sounds are normal. He exhibits no distension. There is no tenderness.  Musculoskeletal: Normal range of motion. He exhibits no edema or tenderness.  Neurological: He is alert and oriented to person, place, and time. He has normal reflexes. No cranial nerve deficit.  Skin: Skin is warm and dry. No rash noted. No erythema.  Psychiatric: He has a normal mood and affect. His behavior is normal. Judgment and thought content normal.  Vitals reviewed.     BP 144/85 mmHg  Pulse 80  Temp(Src) 98.1 F (36.7 C) (Oral)  Ht 6\' 1"  (1.854 m)  Wt 250 lb 6.4 oz (113.581 kg)  BMI 33.04 kg/m2     Assessment & Plan:  1. Acute bronchitis, unspecified organism -- Take meds as prescribed - Use a cool mist humidifier  -Use saline nose sprays frequently -Saline irrigations of the nose can be very helpful if done frequently.  * 4X daily for 1 week*  * Use of a nettie pot can be helpful with this. Follow directions with this* -Force fluids -For any cough or congestion  Use plain Mucinex- regular strength or max strength is fine   * Children- consult with Pharmacist for dosing -For fever or aces or pains- take tylenol or ibuprofen appropriate for age and weight.  * for fevers greater than 101 orally you may alternate ibuprofen and tylenol every  3 hours. -Throat lozenges if help - levofloxacin (LEVAQUIN) 500 MG tablet; Take 1 tablet (500 mg total) by mouth daily.  Dispense: 7 tablet; Refill: 0 - albuterol (PROVENTIL HFA;VENTOLIN HFA) 108 (90 BASE) MCG/ACT inhaler; Inhale 2 puffs into the lungs every 6 (six) hours as needed for wheezing or shortness of breath.  Dispense: 1 Inhaler; Refill: 2 - methylPREDNIsolone (MEDROL DOSPACK) 4 MG tablet; follow package directions  Dispense: 21 tablet; Refill:  0 - benzonatate (TESSALON) 200 MG capsule; Take 1 capsule (200 mg total) by mouth 3 (three) times daily as needed.  Dispense: 30 capsule; Refill: [place], FNP

## 2014-05-05 DIAGNOSIS — I4891 Unspecified atrial fibrillation: Secondary | ICD-10-CM | POA: Diagnosis not present

## 2014-05-05 DIAGNOSIS — M545 Low back pain: Secondary | ICD-10-CM | POA: Diagnosis not present

## 2014-05-05 DIAGNOSIS — S24104A Unspecified injury at T11-T12 level of thoracic spinal cord, initial encounter: Secondary | ICD-10-CM | POA: Diagnosis not present

## 2014-05-06 DIAGNOSIS — M545 Low back pain: Secondary | ICD-10-CM | POA: Diagnosis not present

## 2014-05-06 DIAGNOSIS — S24104A Unspecified injury at T11-T12 level of thoracic spinal cord, initial encounter: Secondary | ICD-10-CM | POA: Diagnosis not present

## 2014-05-06 DIAGNOSIS — I4891 Unspecified atrial fibrillation: Secondary | ICD-10-CM | POA: Diagnosis not present

## 2014-05-07 DIAGNOSIS — M25511 Pain in right shoulder: Secondary | ICD-10-CM | POA: Diagnosis not present

## 2014-05-07 DIAGNOSIS — R270 Ataxia, unspecified: Secondary | ICD-10-CM | POA: Diagnosis not present

## 2014-05-07 DIAGNOSIS — M5416 Radiculopathy, lumbar region: Secondary | ICD-10-CM | POA: Diagnosis not present

## 2014-05-07 DIAGNOSIS — I4891 Unspecified atrial fibrillation: Secondary | ICD-10-CM | POA: Diagnosis not present

## 2014-05-07 DIAGNOSIS — S24104A Unspecified injury at T11-T12 level of thoracic spinal cord, initial encounter: Secondary | ICD-10-CM | POA: Diagnosis not present

## 2014-05-07 DIAGNOSIS — M545 Low back pain: Secondary | ICD-10-CM | POA: Diagnosis not present

## 2014-05-08 DIAGNOSIS — I4891 Unspecified atrial fibrillation: Secondary | ICD-10-CM | POA: Diagnosis not present

## 2014-05-08 DIAGNOSIS — S24104A Unspecified injury at T11-T12 level of thoracic spinal cord, initial encounter: Secondary | ICD-10-CM | POA: Diagnosis not present

## 2014-05-08 DIAGNOSIS — M545 Low back pain: Secondary | ICD-10-CM | POA: Diagnosis not present

## 2014-05-09 DIAGNOSIS — I4891 Unspecified atrial fibrillation: Secondary | ICD-10-CM | POA: Diagnosis not present

## 2014-05-09 DIAGNOSIS — M545 Low back pain: Secondary | ICD-10-CM | POA: Diagnosis not present

## 2014-05-09 DIAGNOSIS — S24104A Unspecified injury at T11-T12 level of thoracic spinal cord, initial encounter: Secondary | ICD-10-CM | POA: Diagnosis not present

## 2014-05-12 DIAGNOSIS — M545 Low back pain: Secondary | ICD-10-CM | POA: Diagnosis not present

## 2014-05-12 DIAGNOSIS — I4891 Unspecified atrial fibrillation: Secondary | ICD-10-CM | POA: Diagnosis not present

## 2014-05-12 DIAGNOSIS — S24104A Unspecified injury at T11-T12 level of thoracic spinal cord, initial encounter: Secondary | ICD-10-CM | POA: Diagnosis not present

## 2014-05-13 DIAGNOSIS — I4891 Unspecified atrial fibrillation: Secondary | ICD-10-CM | POA: Diagnosis not present

## 2014-05-13 DIAGNOSIS — M545 Low back pain: Secondary | ICD-10-CM | POA: Diagnosis not present

## 2014-05-13 DIAGNOSIS — S24104A Unspecified injury at T11-T12 level of thoracic spinal cord, initial encounter: Secondary | ICD-10-CM | POA: Diagnosis not present

## 2014-05-14 ENCOUNTER — Ambulatory Visit: Payer: Medicare Other | Admitting: Family

## 2014-05-14 DIAGNOSIS — S24104A Unspecified injury at T11-T12 level of thoracic spinal cord, initial encounter: Secondary | ICD-10-CM | POA: Diagnosis not present

## 2014-05-14 DIAGNOSIS — I4891 Unspecified atrial fibrillation: Secondary | ICD-10-CM | POA: Diagnosis not present

## 2014-05-14 DIAGNOSIS — M545 Low back pain: Secondary | ICD-10-CM | POA: Diagnosis not present

## 2014-05-15 DIAGNOSIS — S24104A Unspecified injury at T11-T12 level of thoracic spinal cord, initial encounter: Secondary | ICD-10-CM | POA: Diagnosis not present

## 2014-05-15 DIAGNOSIS — M545 Low back pain: Secondary | ICD-10-CM | POA: Diagnosis not present

## 2014-05-15 DIAGNOSIS — I4891 Unspecified atrial fibrillation: Secondary | ICD-10-CM | POA: Diagnosis not present

## 2014-05-16 DIAGNOSIS — I4891 Unspecified atrial fibrillation: Secondary | ICD-10-CM | POA: Diagnosis not present

## 2014-05-16 DIAGNOSIS — S24104A Unspecified injury at T11-T12 level of thoracic spinal cord, initial encounter: Secondary | ICD-10-CM | POA: Diagnosis not present

## 2014-05-16 DIAGNOSIS — M545 Low back pain: Secondary | ICD-10-CM | POA: Diagnosis not present

## 2014-05-16 DIAGNOSIS — N3281 Overactive bladder: Secondary | ICD-10-CM | POA: Diagnosis not present

## 2014-05-16 DIAGNOSIS — R351 Nocturia: Secondary | ICD-10-CM | POA: Diagnosis not present

## 2014-05-19 DIAGNOSIS — S24104A Unspecified injury at T11-T12 level of thoracic spinal cord, initial encounter: Secondary | ICD-10-CM | POA: Diagnosis not present

## 2014-05-19 DIAGNOSIS — I4891 Unspecified atrial fibrillation: Secondary | ICD-10-CM | POA: Diagnosis not present

## 2014-05-19 DIAGNOSIS — M545 Low back pain: Secondary | ICD-10-CM | POA: Diagnosis not present

## 2014-05-20 DIAGNOSIS — X32XXXA Exposure to sunlight, initial encounter: Secondary | ICD-10-CM | POA: Diagnosis not present

## 2014-05-20 DIAGNOSIS — M545 Low back pain: Secondary | ICD-10-CM | POA: Diagnosis not present

## 2014-05-20 DIAGNOSIS — I4891 Unspecified atrial fibrillation: Secondary | ICD-10-CM | POA: Diagnosis not present

## 2014-05-20 DIAGNOSIS — S24104A Unspecified injury at T11-T12 level of thoracic spinal cord, initial encounter: Secondary | ICD-10-CM | POA: Diagnosis not present

## 2014-05-20 DIAGNOSIS — L723 Sebaceous cyst: Secondary | ICD-10-CM | POA: Diagnosis not present

## 2014-05-20 DIAGNOSIS — L570 Actinic keratosis: Secondary | ICD-10-CM | POA: Diagnosis not present

## 2014-05-20 DIAGNOSIS — D225 Melanocytic nevi of trunk: Secondary | ICD-10-CM | POA: Diagnosis not present

## 2014-05-20 DIAGNOSIS — L820 Inflamed seborrheic keratosis: Secondary | ICD-10-CM | POA: Diagnosis not present

## 2014-05-20 DIAGNOSIS — D485 Neoplasm of uncertain behavior of skin: Secondary | ICD-10-CM | POA: Diagnosis not present

## 2014-05-21 DIAGNOSIS — I4891 Unspecified atrial fibrillation: Secondary | ICD-10-CM | POA: Diagnosis not present

## 2014-05-21 DIAGNOSIS — Z87891 Personal history of nicotine dependence: Secondary | ICD-10-CM | POA: Diagnosis not present

## 2014-05-21 DIAGNOSIS — S24104A Unspecified injury at T11-T12 level of thoracic spinal cord, initial encounter: Secondary | ICD-10-CM | POA: Diagnosis not present

## 2014-05-21 DIAGNOSIS — Z79899 Other long term (current) drug therapy: Secondary | ICD-10-CM | POA: Diagnosis not present

## 2014-05-21 DIAGNOSIS — R270 Ataxia, unspecified: Secondary | ICD-10-CM | POA: Diagnosis not present

## 2014-05-21 DIAGNOSIS — R339 Retention of urine, unspecified: Secondary | ICD-10-CM | POA: Diagnosis not present

## 2014-05-21 DIAGNOSIS — I10 Essential (primary) hypertension: Secondary | ICD-10-CM | POA: Diagnosis not present

## 2014-05-21 DIAGNOSIS — M25511 Pain in right shoulder: Secondary | ICD-10-CM | POA: Diagnosis not present

## 2014-05-21 DIAGNOSIS — M5416 Radiculopathy, lumbar region: Secondary | ICD-10-CM | POA: Diagnosis not present

## 2014-05-21 DIAGNOSIS — F418 Other specified anxiety disorders: Secondary | ICD-10-CM | POA: Diagnosis not present

## 2014-05-21 DIAGNOSIS — M5136 Other intervertebral disc degeneration, lumbar region: Secondary | ICD-10-CM | POA: Diagnosis not present

## 2014-05-21 DIAGNOSIS — E039 Hypothyroidism, unspecified: Secondary | ICD-10-CM | POA: Diagnosis not present

## 2014-05-21 DIAGNOSIS — M545 Low back pain: Secondary | ICD-10-CM | POA: Diagnosis not present

## 2014-05-22 DIAGNOSIS — S24104A Unspecified injury at T11-T12 level of thoracic spinal cord, initial encounter: Secondary | ICD-10-CM | POA: Diagnosis not present

## 2014-05-22 DIAGNOSIS — I4891 Unspecified atrial fibrillation: Secondary | ICD-10-CM | POA: Diagnosis not present

## 2014-05-22 DIAGNOSIS — M545 Low back pain: Secondary | ICD-10-CM | POA: Diagnosis not present

## 2014-05-23 DIAGNOSIS — I4891 Unspecified atrial fibrillation: Secondary | ICD-10-CM | POA: Diagnosis not present

## 2014-05-23 DIAGNOSIS — M545 Low back pain: Secondary | ICD-10-CM | POA: Diagnosis not present

## 2014-05-23 DIAGNOSIS — S24104A Unspecified injury at T11-T12 level of thoracic spinal cord, initial encounter: Secondary | ICD-10-CM | POA: Diagnosis not present

## 2014-05-26 DIAGNOSIS — I4891 Unspecified atrial fibrillation: Secondary | ICD-10-CM | POA: Diagnosis not present

## 2014-05-26 DIAGNOSIS — S24104A Unspecified injury at T11-T12 level of thoracic spinal cord, initial encounter: Secondary | ICD-10-CM | POA: Diagnosis not present

## 2014-05-26 DIAGNOSIS — M545 Low back pain: Secondary | ICD-10-CM | POA: Diagnosis not present

## 2014-05-27 DIAGNOSIS — M545 Low back pain: Secondary | ICD-10-CM | POA: Diagnosis not present

## 2014-05-27 DIAGNOSIS — I4891 Unspecified atrial fibrillation: Secondary | ICD-10-CM | POA: Diagnosis not present

## 2014-05-27 DIAGNOSIS — S24104A Unspecified injury at T11-T12 level of thoracic spinal cord, initial encounter: Secondary | ICD-10-CM | POA: Diagnosis not present

## 2014-05-28 ENCOUNTER — Telehealth: Payer: Self-pay | Admitting: Family

## 2014-05-28 DIAGNOSIS — M545 Low back pain: Secondary | ICD-10-CM | POA: Diagnosis not present

## 2014-05-28 DIAGNOSIS — S24104A Unspecified injury at T11-T12 level of thoracic spinal cord, initial encounter: Secondary | ICD-10-CM | POA: Diagnosis not present

## 2014-05-28 DIAGNOSIS — I4891 Unspecified atrial fibrillation: Secondary | ICD-10-CM | POA: Diagnosis not present

## 2014-05-28 NOTE — Telephone Encounter (Signed)
Pt given appt with Christy 3/16 at 11:10.

## 2014-05-29 DIAGNOSIS — M545 Low back pain: Secondary | ICD-10-CM | POA: Diagnosis not present

## 2014-05-29 DIAGNOSIS — I4891 Unspecified atrial fibrillation: Secondary | ICD-10-CM | POA: Diagnosis not present

## 2014-05-29 DIAGNOSIS — S24104A Unspecified injury at T11-T12 level of thoracic spinal cord, initial encounter: Secondary | ICD-10-CM | POA: Diagnosis not present

## 2014-06-03 DIAGNOSIS — I4891 Unspecified atrial fibrillation: Secondary | ICD-10-CM | POA: Diagnosis not present

## 2014-06-03 DIAGNOSIS — M545 Low back pain: Secondary | ICD-10-CM | POA: Diagnosis not present

## 2014-06-03 DIAGNOSIS — S24104A Unspecified injury at T11-T12 level of thoracic spinal cord, initial encounter: Secondary | ICD-10-CM | POA: Diagnosis not present

## 2014-06-04 ENCOUNTER — Encounter: Payer: Self-pay | Admitting: Family

## 2014-06-04 ENCOUNTER — Ambulatory Visit (INDEPENDENT_AMBULATORY_CARE_PROVIDER_SITE_OTHER): Payer: Medicare Other | Admitting: Family

## 2014-06-04 VITALS — BP 125/83 | HR 64 | Temp 97.7°F | Ht 73.0 in | Wt 267.6 lb

## 2014-06-04 DIAGNOSIS — F329 Major depressive disorder, single episode, unspecified: Secondary | ICD-10-CM

## 2014-06-04 DIAGNOSIS — F411 Generalized anxiety disorder: Secondary | ICD-10-CM

## 2014-06-04 DIAGNOSIS — M5416 Radiculopathy, lumbar region: Secondary | ICD-10-CM | POA: Diagnosis not present

## 2014-06-04 DIAGNOSIS — F32A Depression, unspecified: Secondary | ICD-10-CM

## 2014-06-04 DIAGNOSIS — R251 Tremor, unspecified: Secondary | ICD-10-CM

## 2014-06-04 DIAGNOSIS — R5383 Other fatigue: Secondary | ICD-10-CM | POA: Diagnosis not present

## 2014-06-04 DIAGNOSIS — R259 Unspecified abnormal involuntary movements: Secondary | ICD-10-CM | POA: Diagnosis not present

## 2014-06-04 DIAGNOSIS — G4733 Obstructive sleep apnea (adult) (pediatric): Secondary | ICD-10-CM | POA: Diagnosis not present

## 2014-06-04 DIAGNOSIS — R258 Other abnormal involuntary movements: Secondary | ICD-10-CM | POA: Diagnosis not present

## 2014-06-04 DIAGNOSIS — M25511 Pain in right shoulder: Secondary | ICD-10-CM | POA: Diagnosis not present

## 2014-06-04 DIAGNOSIS — S24104A Unspecified injury at T11-T12 level of thoracic spinal cord, initial encounter: Secondary | ICD-10-CM | POA: Diagnosis not present

## 2014-06-04 DIAGNOSIS — I4891 Unspecified atrial fibrillation: Secondary | ICD-10-CM | POA: Diagnosis not present

## 2014-06-04 DIAGNOSIS — M545 Low back pain: Secondary | ICD-10-CM | POA: Diagnosis not present

## 2014-06-04 DIAGNOSIS — E559 Vitamin D deficiency, unspecified: Secondary | ICD-10-CM | POA: Diagnosis not present

## 2014-06-04 MED ORDER — ESCITALOPRAM OXALATE 20 MG PO TABS: 20.0000 mg | ORAL_TABLET | Freq: Every day | ORAL | Status: DC

## 2014-06-04 NOTE — Progress Notes (Signed)
Subjective:    Patient ID: Kenneth Rhodes, male    DOB: 1945-06-11, 69 y.o.   MRN: [id number]  HPI  Pt presents to the office today with complaints of his hands shaking and random body tremors that started about 3 months ago. Pt states his hands shake worse when he tries to use them. States he still has "shaking" while resting, but just not as bad. Pt states he is feeling extremely fatigued and gaining weight. Pt states he has gained 30lbs in 4 months. Pt states in the past his testosterone levels were very low and he was having extreme fatigue, but that resolve once he was put on testosterone. Pt also is complaining of depression. Pt states he has been on zoloft for years, but feels like it is no longer helping him. States he would like to discuss switching to another medication.    Review of Systems  Constitutional: Positive for fatigue.  HENT: Negative.   Respiratory: Negative.   Cardiovascular: Negative.   Gastrointestinal: Negative.   Endocrine: Negative.   Genitourinary: Negative.   Musculoskeletal: Negative.   Neurological: Positive for tremors.  Hematological: Negative.   Psychiatric/Behavioral: Negative.   All other systems reviewed and are negative.      Objective:   Physical Exam  Constitutional: He is oriented to person, place, and time. He appears well-developed and well-nourished. No distress.  HENT:  Head: Normocephalic.  Right Ear: External ear normal.  Left Ear: External ear normal.  Nose: Nose normal.  Mouth/Throat: Oropharynx is clear and moist.  Eyes: Pupils are equal, round, and reactive to light. Right eye exhibits no discharge. Left eye exhibits no discharge.  Neck: Normal range of motion. Neck supple. No thyromegaly present.  Cardiovascular: Normal rate, regular rhythm, normal heart sounds and intact distal pulses.   No murmur heard. Pulmonary/Chest: Effort normal and breath sounds normal. No respiratory distress. He has no wheezes.  Abdominal:  Soft. Bowel sounds are normal. He exhibits no distension. There is no tenderness.  Musculoskeletal: Normal range of motion. He exhibits no edema or tenderness.  Neurological: He is alert and oriented to person, place, and time. He has normal reflexes. No cranial nerve deficit.  Skin: Skin is warm and dry. No rash noted. No erythema.  Psychiatric: He has a normal mood and affect. His behavior is normal. Judgment and thought content normal.  Vitals reviewed.   BP 125/83 mmHg  Pulse 64  Temp(Src) 97.7 F (36.5 C) (Oral)  Ht $R'6\' 1"'Gu$  (1.854 m)  Wt 267 lb 9.6 oz (121.383 kg)  BMI 35.31 kg/m2       Assessment & Plan:  1. Tremor of both hands - Testosterone,Free and Total - Thyroid Panel With TSH - Vit D  25 hydroxy (rtn osteoporosis monitoring) - CMP14+EGFR  2. Occasional tremors - Testosterone,Free and Total - Thyroid Panel With TSH - Vit D  25 hydroxy (rtn osteoporosis monitoring) - CMP14+EGFR  3. Other fatigue - Testosterone,Free and Total - Thyroid Panel With TSH - Vit D  25 hydroxy (rtn osteoporosis monitoring) - CMP14+EGFR  4. Depression - escitalopram (LEXAPRO) 20 MG tablet; Take 1 tablet (20 mg total) by mouth daily.  Dispense: 90 tablet; Refill: 4  5. GAD (generalized anxiety disorder) - escitalopram (LEXAPRO) 20 MG tablet; Take 1 tablet (20 mg total) by mouth daily.  Dispense: 90 tablet; Refill: 4   Continue all meds Labs pending Stress management discussed Diet and exercise encouraged RTO 2 weeks to recheck Depression and tremors  [facility]  Lenna Gilford, [place]

## 2014-06-04 NOTE — Patient Instructions (Signed)
Tremor Tremor is a rhythmic, involuntary muscular contraction characterized by oscillations (to-and-fro movements) of a part of the body. The most common of all involuntary movements, tremor can affect various body parts such as the hands, head, facial structures, vocal cords, trunk, and legs; most tremors, however, occur in the hands. Tremor often accompanies neurological disorders associated with aging. Although the disorder is not life-threatening, it can be responsible for functional disability and social embarrassment. TREATMENT  There are many types of tremor and several ways in which tremor is classified. The most common classification is by behavioral context or position. There are five categories of tremor within this classification: resting, postural, kinetic, task-specific, and psychogenic. Resting or static tremor occurs when the muscle is at rest, for example when the hands are lying on the lap. This type of tremor is often seen in patients with Parkinson's disease. Postural tremor occurs when a patient attempts to maintain posture, such as holding the hands outstretched. Postural tremors include physiological tremor, essential tremor, tremor with basal ganglia disease (also seen in patients with Parkinson's disease), cerebellar postural tremor, tremor with peripheral neuropathy, post-traumatic tremor, and alcoholic tremor. Kinetic or intention (action) tremor occurs during purposeful movement, for example during finger-to-nose testing. Task-specific tremor appears when performing goal-oriented tasks such as handwriting, speaking, or standing. This group consists of primary writing tremor, vocal tremor, and orthostatic tremor. Psychogenic tremor occurs in both older and younger patients. The key feature of this tremor is that it dramatically lessens or disappears when the patient is distracted. PROGNOSIS There are some treatment options available for tremor; the appropriate treatment depends on  accurate diagnosis of the cause. Some tremors respond to treatment of the underlying condition, for example in some cases of psychogenic tremor treating the patient's underlying mental problem may cause the tremor to disappear. Also, patients with tremor due to Parkinson's disease may be treated with Levodopa drug therapy. Symptomatic drug therapy is available for several other tremors as well. For those cases of tremor in which there is no effective drug treatment, physical measures such as teaching the patient to brace the affected limb during the tremor are sometimes useful. Surgical intervention such as thalamotomy or deep brain stimulation may be useful in certain cases. Document Released: 02/25/2002 Document Revised: 05/30/2011 Document Reviewed: 03/07/2005 [facility] Patient Information 2015 [place], [place]. This information is not intended to replace advice given to you by your health care provider. Make sure you discuss any questions you have with your health care provider. Depression Depression refers to feeling sad, low, down in the dumps, blue, gloomy, or empty. In general, there are two kinds of depression: 1. Normal sadness or normal grief. This kind of depression is one that we all feel from time to time after upsetting life experiences, such as the loss of a job or the ending of a relationship. This kind of depression is considered normal, is short lived, and resolves within a few days to 2 weeks. Depression experienced after the loss of a loved one (bereavement) often lasts longer than 2 weeks but normally gets better with time. 2. Clinical depression. This kind of depression lasts longer than normal sadness or normal grief or interferes with your ability to function at home, at work, and in school. It also interferes with your personal relationships. It affects almost every aspect of your life. Clinical depression is an illness. Symptoms of depression can also be caused by conditions other  than those mentioned above, such as:  Physical illness. Some physical illnesses,  including underactive thyroid gland (hypothyroidism), severe anemia, specific types of cancer, diabetes, uncontrolled seizures, heart and lung problems, strokes, and chronic pain are commonly associated with symptoms of depression.  Side effects of some prescription medicine. In some people, certain types of medicine can cause symptoms of depression.  Substance abuse. Abuse of alcohol and illicit drugs can cause symptoms of depression. SYMPTOMS Symptoms of normal sadness and normal grief include the following:  Feeling sad or crying for short periods of time.  Not caring about anything (apathy).  Difficulty sleeping or sleeping too much.  No longer able to enjoy the things you used to enjoy.  Desire to be by oneself all the time (social isolation).  Lack of energy or motivation.  Difficulty concentrating or remembering.  Change in appetite or weight.  Restlessness or agitation. Symptoms of clinical depression include the same symptoms of normal sadness or normal grief and also the following symptoms:  Feeling sad or crying all the time.  Feelings of guilt or worthlessness.  Feelings of hopelessness or helplessness.  Thoughts of suicide or the desire to harm yourself (suicidal ideation).  Loss of touch with reality (psychotic symptoms). Seeing or hearing things that are not real (hallucinations) or having false beliefs about your life or the people around you (delusions and paranoia). DIAGNOSIS  The diagnosis of clinical depression is usually based on how bad the symptoms are and how long they have lasted. Your health care provider will also ask you questions about your medical history and substance use to find out if physical illness, use of prescription medicine, or substance abuse is causing your depression. Your health care provider may also order blood tests. TREATMENT  Often, normal sadness  and normal grief do not require treatment. However, sometimes antidepressant medicine is given for bereavement to ease the depressive symptoms until they resolve. The treatment for clinical depression depends on how bad the symptoms are but often includes antidepressant medicine, counseling with a mental health professional, or both. Your health care provider will help to determine what treatment is best for you. Depression caused by physical illness usually goes away with appropriate medical treatment of the illness. If prescription medicine is causing depression, talk with your health care provider about stopping the medicine, decreasing the dose, or changing to another medicine. Depression caused by the abuse of alcohol or illicit drugs goes away when you stop using these substances. Some adults need professional help in order to stop drinking or using drugs. SEEK IMMEDIATE MEDICAL CARE IF:  You have thoughts about hurting yourself or others.  You lose touch with reality (have psychotic symptoms).  You are taking medicine for depression and have a serious side effect. FOR MORE INFORMATION  National [employer] Mental [employer]ness: www.nami.[employer]: https://carter.com/ Document Released: 03/04/2000 Document Revised: 07/22/2013 Document Reviewed: 06/06/2011 [facility] Patient Information 2015 [place], [place]. This information is not intended to replace advice given to you by your health care provider. Make sure you discuss any questions you have with your health care provider.

## 2014-06-05 DIAGNOSIS — S24104A Unspecified injury at T11-T12 level of thoracic spinal cord, initial encounter: Secondary | ICD-10-CM | POA: Diagnosis not present

## 2014-06-05 DIAGNOSIS — I4891 Unspecified atrial fibrillation: Secondary | ICD-10-CM | POA: Diagnosis not present

## 2014-06-05 DIAGNOSIS — M545 Low back pain: Secondary | ICD-10-CM | POA: Diagnosis not present

## 2014-06-05 LAB — CMP14+EGFR
A/G RATIO: 1.6 (ref 1.1–2.5)
ALBUMIN: 4.2 g/dL (ref 3.6–4.8)
ALT: 91 IU/L — AB (ref 0–44)
AST: 74 IU/L — AB (ref 0–40)
Alkaline Phosphatase: 132 IU/L — ABNORMAL HIGH (ref 39–117)
BUN / CREAT RATIO: 17 (ref 10–22)
BUN: 16 mg/dL (ref 8–27)
Bilirubin Total: 0.3 mg/dL (ref 0.0–1.2)
CHLORIDE: 102 mmol/L (ref 97–108)
CO2: 25 mmol/L (ref 18–29)
CREATININE: 0.93 mg/dL (ref 0.76–1.27)
Calcium: 9.2 mg/dL (ref 8.6–10.2)
GFR, EST AFRICAN AMERICAN: 99 mL/min/{1.73_m2} (ref >59)
GFR, EST NON AFRICAN AMERICAN: 85 mL/min/{1.73_m2} (ref >59)
GLOBULIN, TOTAL: 2.6 g/dL (ref 1.5–4.5)
Glucose: 91 mg/dL (ref 65–99)
POTASSIUM: 5 mmol/L (ref 3.5–5.2)
SODIUM: 143 mmol/L (ref 134–144)
Total Protein: 6.8 g/dL (ref 6.0–8.5)

## 2014-06-05 LAB — THYROID PANEL WITH TSH
Free Thyroxine Index: 2.2 (ref 1.2–4.9)
T3 Uptake Ratio: 24 % (ref 24–39)
T4, Total: 9.2 ug/dL (ref 4.5–12.0)
TSH: 3.31 u[IU]/mL (ref 0.450–4.500)

## 2014-06-05 LAB — TESTOSTERONE,FREE AND TOTAL
Testosterone, Free: 0.5 pg/mL — ABNORMAL LOW (ref 6.6–18.1)
Testosterone: 132 ng/dL — ABNORMAL LOW (ref 348–1197)

## 2014-06-05 LAB — VITAMIN D 25 HYDROXY (VIT D DEFICIENCY, FRACTURES): VIT D 25 HYDROXY: 13.9 ng/mL — AB (ref 30.0–100.0)

## 2014-06-06 DIAGNOSIS — M545 Low back pain: Secondary | ICD-10-CM | POA: Diagnosis not present

## 2014-06-06 DIAGNOSIS — I4891 Unspecified atrial fibrillation: Secondary | ICD-10-CM | POA: Diagnosis not present

## 2014-06-06 DIAGNOSIS — S24104A Unspecified injury at T11-T12 level of thoracic spinal cord, initial encounter: Secondary | ICD-10-CM | POA: Diagnosis not present

## 2014-06-09 ENCOUNTER — Other Ambulatory Visit: Payer: Self-pay | Admitting: Family

## 2014-06-09 ENCOUNTER — Telehealth: Payer: Self-pay | Admitting: Family

## 2014-06-09 DIAGNOSIS — E559 Vitamin D deficiency, unspecified: Secondary | ICD-10-CM | POA: Insufficient documentation

## 2014-06-09 DIAGNOSIS — S24104A Unspecified injury at T11-T12 level of thoracic spinal cord, initial encounter: Secondary | ICD-10-CM | POA: Diagnosis not present

## 2014-06-09 DIAGNOSIS — M545 Low back pain: Secondary | ICD-10-CM | POA: Diagnosis not present

## 2014-06-09 DIAGNOSIS — E349 Endocrine disorder, unspecified: Secondary | ICD-10-CM | POA: Insufficient documentation

## 2014-06-09 DIAGNOSIS — I4891 Unspecified atrial fibrillation: Secondary | ICD-10-CM | POA: Diagnosis not present

## 2014-06-09 MED ORDER — TESTOSTERONE 50 MG/5GM (1%) TD GEL: 5.0000 g | Freq: Every day | TRANSDERMAL | Status: DC

## 2014-06-09 MED ORDER — VITAMIN D (ERGOCALCIFEROL) 1.25 MG (50000 UNIT) PO CAPS
50000.0000 [IU] | ORAL_CAPSULE | ORAL | Status: DC
Start: 2014-06-09 — End: 2014-10-17

## 2014-06-09 NOTE — Telephone Encounter (Signed)
Pt was just contacted about lab results

## 2014-06-09 NOTE — Progress Notes (Signed)
Patient aware.

## 2014-06-10 DIAGNOSIS — I4891 Unspecified atrial fibrillation: Secondary | ICD-10-CM | POA: Diagnosis not present

## 2014-06-10 DIAGNOSIS — S24104A Unspecified injury at T11-T12 level of thoracic spinal cord, initial encounter: Secondary | ICD-10-CM | POA: Diagnosis not present

## 2014-06-10 DIAGNOSIS — M545 Low back pain: Secondary | ICD-10-CM | POA: Diagnosis not present

## 2014-06-10 NOTE — Telephone Encounter (Signed)
Lab results were discussed and patient is aware to come by for his Androgel script.

## 2014-06-11 DIAGNOSIS — M545 Low back pain: Secondary | ICD-10-CM | POA: Diagnosis not present

## 2014-06-11 DIAGNOSIS — I4891 Unspecified atrial fibrillation: Secondary | ICD-10-CM | POA: Diagnosis not present

## 2014-06-11 DIAGNOSIS — S24104A Unspecified injury at T11-T12 level of thoracic spinal cord, initial encounter: Secondary | ICD-10-CM | POA: Diagnosis not present

## 2014-06-12 DIAGNOSIS — M545 Low back pain: Secondary | ICD-10-CM | POA: Diagnosis not present

## 2014-06-12 DIAGNOSIS — I4891 Unspecified atrial fibrillation: Secondary | ICD-10-CM | POA: Diagnosis not present

## 2014-06-12 DIAGNOSIS — S24104A Unspecified injury at T11-T12 level of thoracic spinal cord, initial encounter: Secondary | ICD-10-CM | POA: Diagnosis not present

## 2014-06-13 DIAGNOSIS — S24104A Unspecified injury at T11-T12 level of thoracic spinal cord, initial encounter: Secondary | ICD-10-CM | POA: Diagnosis not present

## 2014-06-13 DIAGNOSIS — M545 Low back pain: Secondary | ICD-10-CM | POA: Diagnosis not present

## 2014-06-13 DIAGNOSIS — I4891 Unspecified atrial fibrillation: Secondary | ICD-10-CM | POA: Diagnosis not present

## 2014-06-16 DIAGNOSIS — S24104A Unspecified injury at T11-T12 level of thoracic spinal cord, initial encounter: Secondary | ICD-10-CM | POA: Diagnosis not present

## 2014-06-16 DIAGNOSIS — M545 Low back pain: Secondary | ICD-10-CM | POA: Diagnosis not present

## 2014-06-16 DIAGNOSIS — I4891 Unspecified atrial fibrillation: Secondary | ICD-10-CM | POA: Diagnosis not present

## 2014-06-17 DIAGNOSIS — M545 Low back pain: Secondary | ICD-10-CM | POA: Diagnosis not present

## 2014-06-17 DIAGNOSIS — S24104A Unspecified injury at T11-T12 level of thoracic spinal cord, initial encounter: Secondary | ICD-10-CM | POA: Diagnosis not present

## 2014-06-17 DIAGNOSIS — I4891 Unspecified atrial fibrillation: Secondary | ICD-10-CM | POA: Diagnosis not present

## 2014-06-18 DIAGNOSIS — I4891 Unspecified atrial fibrillation: Secondary | ICD-10-CM | POA: Diagnosis not present

## 2014-06-18 DIAGNOSIS — M545 Low back pain: Secondary | ICD-10-CM | POA: Diagnosis not present

## 2014-06-18 DIAGNOSIS — S24104A Unspecified injury at T11-T12 level of thoracic spinal cord, initial encounter: Secondary | ICD-10-CM | POA: Diagnosis not present

## 2014-06-19 DIAGNOSIS — M545 Low back pain: Secondary | ICD-10-CM | POA: Diagnosis not present

## 2014-06-19 DIAGNOSIS — S24104A Unspecified injury at T11-T12 level of thoracic spinal cord, initial encounter: Secondary | ICD-10-CM | POA: Diagnosis not present

## 2014-06-19 DIAGNOSIS — I4891 Unspecified atrial fibrillation: Secondary | ICD-10-CM | POA: Diagnosis not present

## 2014-06-20 DIAGNOSIS — M545 Low back pain: Secondary | ICD-10-CM | POA: Diagnosis not present

## 2014-06-20 DIAGNOSIS — I4891 Unspecified atrial fibrillation: Secondary | ICD-10-CM | POA: Diagnosis not present

## 2014-06-20 DIAGNOSIS — S24104A Unspecified injury at T11-T12 level of thoracic spinal cord, initial encounter: Secondary | ICD-10-CM | POA: Diagnosis not present

## 2014-06-23 ENCOUNTER — Telehealth: Payer: Self-pay

## 2014-06-23 NOTE — Telephone Encounter (Signed)
Androgel approved until June 18 2015

## 2014-06-25 ENCOUNTER — Ambulatory Visit (INDEPENDENT_AMBULATORY_CARE_PROVIDER_SITE_OTHER): Payer: Medicare Other

## 2014-06-25 ENCOUNTER — Encounter: Payer: Self-pay | Admitting: Family

## 2014-06-25 ENCOUNTER — Ambulatory Visit (INDEPENDENT_AMBULATORY_CARE_PROVIDER_SITE_OTHER): Payer: Medicare Other | Admitting: Family

## 2014-06-25 VITALS — BP 137/83 | HR 63 | Temp 97.8°F | Ht 73.0 in | Wt 273.0 lb

## 2014-06-25 DIAGNOSIS — T148 Other injury of unspecified body region: Secondary | ICD-10-CM | POA: Diagnosis not present

## 2014-06-25 DIAGNOSIS — F331 Major depressive disorder, recurrent, moderate: Secondary | ICD-10-CM

## 2014-06-25 DIAGNOSIS — T148XXA Other injury of unspecified body region, initial encounter: Secondary | ICD-10-CM

## 2014-06-25 DIAGNOSIS — R1012 Left upper quadrant pain: Secondary | ICD-10-CM

## 2014-06-25 DIAGNOSIS — R5383 Other fatigue: Secondary | ICD-10-CM | POA: Diagnosis not present

## 2014-06-25 MED ORDER — CITALOPRAM HYDROBROMIDE 40 MG PO TABS: 40.0000 mg | ORAL_TABLET | Freq: Every day | ORAL | Status: DC

## 2014-06-25 MED ORDER — KETOROLAC TROMETHAMINE 30 MG/ML IJ SOLN
30.0000 mg | Freq: Once | INTRAMUSCULAR | Status: AC
  Administered 2014-06-25: 30 mg via INTRAMUSCULAR

## 2014-06-25 NOTE — Patient Instructions (Addendum)
Muscle Pain  Muscle pain (myalgia) may be caused by many things, including:   Overuse or muscle strain, especially if you are not in shape. This is the most common cause of muscle pain.   Injury.   Bruises.   Viruses, such as the flu.   Infectious diseases.   Fibromyalgia, which is a chronic condition that causes muscle tenderness, fatigue, and headache.   Autoimmune diseases, including lupus.   Certain drugs, including ACE inhibitors and statins.  Muscle pain may be mild or severe. In most cases, the pain lasts only a short time and goes away without treatment. To diagnose the cause of your muscle pain, your health care provider will take your medical history. This means he or she will ask you when your muscle pain began and what has been happening. If you have not had muscle pain for very long, your health care provider may want to wait before doing much testing. If your muscle pain has lasted a long time, your health care provider may want to run tests right away. If your health care provider thinks your muscle pain may be caused by illness, you may need to have additional tests to rule out certain conditions.   Treatment for muscle pain depends on the cause. Home care is often enough to relieve muscle pain. Your health care provider may also prescribe anti-inflammatory medicine.  HOME CARE INSTRUCTIONS  Watch your condition for any changes. The following actions may help to lessen any discomfort you are feeling:   Only take over-the-counter or prescription medicines as directed by your health care provider.   Apply ice to the sore muscle:   Put ice in a plastic bag.   Place a towel between your skin and the bag.   Leave the ice on for 15-20 minutes, 3-4 times a day.   You may alternate applying hot and cold packs to the muscle as directed by your health care provider.   If overuse is causing your muscle pain, slow down your activities until the pain goes away.   Remember that it is normal to feel  some muscle pain after starting a workout program. Muscles that have not been used often will be sore at first.   Do regular, gentle exercises if you are not usually active.   Warm up before exercising to lower your risk of muscle pain.   Do not continue working out if the pain is very bad. Bad pain could mean you have injured a muscle.  SEEK MEDICAL CARE IF:   Your muscle pain gets worse, and medicines do not help.   You have muscle pain that lasts longer than 3 days.   You have a rash or fever along with muscle pain.   You have muscle pain after a tick bite.   You have muscle pain while working out, even though you are in good physical condition.   You have redness, soreness, or swelling along with muscle pain.   You have muscle pain after starting a new medicine or changing the dose of a medicine.  SEEK IMMEDIATE MEDICAL CARE IF:   You have trouble breathing.   You have trouble swallowing.   You have muscle pain along with a stiff neck, fever, and vomiting.   You have severe muscle weakness or cannot move part of your body.  MAKE SURE YOU:    Understand these instructions.   Will watch your condition.   Will get help right away if you are not   questions you have with your health care provider. Fatigue Fatigue is a feeling of tiredness, lack of energy, lack of motivation, or feeling tired all the time. Having enough rest, good nutrition, and reducing stress will normally reduce fatigue. Consult your caregiver if it persists. The nature of your fatigue will help your caregiver to find out its cause. The treatment is based on the cause.  CAUSES  There are many causes for fatigue. Most of the time, fatigue can be  traced to one or more of your habits or routines. Most causes fit into one or more of three general areas. They are: Lifestyle problems  Sleep disturbances.  Overwork.  Physical exertion.  Unhealthy habits.  Poor eating habits or eating disorders.  Alcohol and/or drug use .  Lack of proper nutrition (malnutrition). Psychological problems  Stress and/or anxiety problems.  Depression.  Grief.  Boredom. Medical Problems or Conditions  Anemia.  Pregnancy.  Thyroid gland problems.  Recovery from major surgery.  Continuous pain.  Emphysema or asthma that is not well controlled  Allergic conditions.  Diabetes.  Infections (such as mononucleosis).  Obesity.  Sleep disorders, such as sleep apnea.  Heart failure or other heart-related problems.  Cancer.  Kidney disease.  Liver disease.  Effects of certain medicines such as antihistamines, cough and cold remedies, prescription pain medicines, heart and blood pressure medicines, drugs used for treatment of cancer, and some antidepressants. SYMPTOMS  The symptoms of fatigue include:   Lack of energy.  Lack of drive (motivation).  Drowsiness.  Feeling of indifference to the surroundings. DIAGNOSIS  The details of how you feel help guide your caregiver in finding out what is causing the fatigue. You will be asked about your present and past health condition. It is important to review all medicines that you take, including prescription and non-prescription items. A thorough exam will be done. You will be questioned about your feelings, habits, and normal lifestyle. Your caregiver may suggest blood tests, urine tests, or other tests to look for common medical causes of fatigue.  TREATMENT  Fatigue is treated by correcting the underlying cause. For example, if you have continuous pain or depression, treating these causes will improve how you feel. Similarly, adjusting the dose of certain medicines will help in  reducing fatigue.  HOME CARE INSTRUCTIONS   Try to get the required amount of good sleep every night.  Eat a healthy and nutritious diet, and drink enough water throughout the day.  Practice ways of relaxing (including yoga or meditation).  Exercise regularly.  Make plans to change situations that cause stress. Act on those plans so that stresses decrease over time. Keep your work and personal routine reasonable.  Avoid street drugs and minimize use of alcohol.  Start taking a daily multivitamin after consulting your caregiver. SEEK MEDICAL CARE IF:   You have persistent tiredness, which cannot be accounted for.  You have fever.  You have unintentional weight loss.  You have headaches.  You have disturbed sleep throughout the night.  You are feeling sad.  You have constipation.  You have dry skin.  You have gained weight.  You are taking any new or different medicines that you suspect are causing fatigue.  You are unable to sleep at night.  You develop any unusual swelling of your legs or other parts of your body. SEEK IMMEDIATE MEDICAL CARE IF:   You are feeling confused.  Your vision is blurred.  You feel faint or pass out.  You develop  severe headache.  You develop severe abdominal, pelvic, or back pain.  You develop chest pain, shortness of breath, or an irregular or fast heartbeat.  You are unable to pass a normal amount of urine.  You develop abnormal bleeding such as bleeding from the rectum or you vomit blood.  You have thoughts about harming yourself or committing suicide.  You are worried that you might harm someone else. MAKE SURE YOU:   Understand these instructions.  Will watch your condition.  Will get help right away if you are not doing well or get worse. Document Released: 01/02/2007 Document Revised: 05/30/2011 Document Reviewed: 07/09/2013 [facility] Patient Information 2015 [place], [place]. This information is not intended to  replace advice given to you by your health care provider. Make sure you discuss any questions you have with your health care provider.

## 2014-06-25 NOTE — Progress Notes (Signed)
Subjective:    Patient ID: Kenneth Rhodes, male    DOB: 02-Jan-1946, 69 y.o.   MRN: [id number]  HPI Pt presents to the office today to recheck depression and hand tremors. Pt states his hand tremors have improved, but pt states he states he is still having hand tremors at times. Pt states he is taking Lexapro 20 mg daily. Pt states he feels like he is sleepy and tired all the time. Pt states he takes lyrica and suboxone daily. Pt see's a pain clinic regularly.   Pt states he is having pain in his left rib pain. Pt states it is intermittent pain that  Gets worse when he moves or tries to get out of a car. Pt states he can not recall any injury, but states it started a few weeks. Pt states he was constipated and he was straining very hard and it could be related to that.    Review of Systems  Constitutional: Negative.   HENT: Negative.   Respiratory: Negative.   Cardiovascular: Negative.   Gastrointestinal: Negative.   Endocrine: Negative.   Genitourinary: Negative.   Musculoskeletal: Negative.   Neurological: Negative.   Hematological: Negative.   Psychiatric/Behavioral: Negative.   All other systems reviewed and are negative.      Objective:   Physical Exam  Constitutional: He is oriented to person, place, and time. He appears well-developed and well-nourished. No distress.  HENT:  Head: Normocephalic.  Right Ear: External ear normal.  Left Ear: External ear normal.  Nose: Nose normal.  Mouth/Throat: Oropharynx is clear and moist.  Eyes: Pupils are equal, round, and reactive to light. Right eye exhibits no discharge. Left eye exhibits no discharge.  Neck: Normal range of motion. Neck supple. No thyromegaly present.  Cardiovascular: Normal rate, regular rhythm, normal heart sounds and intact distal pulses.   No murmur heard. Pulmonary/Chest: Effort normal and breath sounds normal. No respiratory distress. He has no wheezes.  Abdominal: Soft. Bowel sounds are normal. He  exhibits no distension. There is no tenderness.  Musculoskeletal: Normal range of motion. He exhibits no edema or tenderness.  Neurological: He is alert and oriented to person, place, and time. He has normal reflexes. No cranial nerve deficit.  Skin: Skin is warm and dry. No rash noted. No erythema.  Psychiatric: He has a normal mood and affect. His behavior is normal. Judgment and thought content normal.  Vitals reviewed.   BP 137/83 mmHg  Pulse 63  Temp(Src) 97.8 F (36.6 C) (Oral)  Ht 6\' 1"  (1.854 m)  Wt 273 lb (123.832 kg)  BMI 36.03 kg/m2  KUB- Moderate amt of air and stool in colon Preliminary reading by Evelina Dun, FNP [facility]      Assessment & Plan:  1. Muscle strain -Rest - ketorolac (TORADOL) 30 MG/ML injection 30 mg; Inject 1 mL (30 mg total) into the muscle once.  2. Major depressive disorder, recurrent episode, moderate -Stress managment - citalopram (CELEXA) 40 MG tablet; Take 1 tablet (40 mg total) by mouth daily.  Dispense: 30 tablet; Refill: 3  3. Abdominal pain, LUQ -Use Miralax BID for next few days to "clear"  Pt out - DG Abd 1 View; Future - ketorolac (TORADOL) 30 MG/ML injection 30 mg; Inject 1 mL (30 mg total) into the muscle once.   4. Other fatigue Pt needs to talk to pain clinic about decreased suboxone and/or lyrica -Pt states he is falling asleep any time he sits for any period of time  Continue all meds Labs pending Health Maintenance reviewed Diet and exercise encouraged RTO 3 weeks-Recheck depression and fatigue  Evelina Dun, FNP

## 2014-06-26 DIAGNOSIS — I10 Essential (primary) hypertension: Secondary | ICD-10-CM | POA: Diagnosis not present

## 2014-06-26 DIAGNOSIS — Z87891 Personal history of nicotine dependence: Secondary | ICD-10-CM | POA: Diagnosis not present

## 2014-06-26 DIAGNOSIS — R1032 Left lower quadrant pain: Secondary | ICD-10-CM | POA: Diagnosis not present

## 2014-06-26 DIAGNOSIS — E039 Hypothyroidism, unspecified: Secondary | ICD-10-CM | POA: Diagnosis not present

## 2014-06-26 DIAGNOSIS — Z79899 Other long term (current) drug therapy: Secondary | ICD-10-CM | POA: Diagnosis not present

## 2014-06-26 DIAGNOSIS — F418 Other specified anxiety disorders: Secondary | ICD-10-CM | POA: Diagnosis not present

## 2014-06-26 DIAGNOSIS — M5136 Other intervertebral disc degeneration, lumbar region: Secondary | ICD-10-CM | POA: Diagnosis not present

## 2014-06-26 DIAGNOSIS — M1990 Unspecified osteoarthritis, unspecified site: Secondary | ICD-10-CM | POA: Diagnosis not present

## 2014-06-26 DIAGNOSIS — K5900 Constipation, unspecified: Secondary | ICD-10-CM | POA: Diagnosis not present

## 2014-06-27 ENCOUNTER — Telehealth: Payer: Self-pay | Admitting: Family

## 2014-06-27 ENCOUNTER — Other Ambulatory Visit: Payer: Self-pay | Admitting: *Deleted

## 2014-06-27 ENCOUNTER — Telehealth: Payer: Self-pay | Admitting: *Deleted

## 2014-06-27 MED ORDER — PROMETHAZINE HCL 25 MG PO TABS: 25.0000 mg | ORAL_TABLET | Freq: Four times a day (QID) | ORAL | Status: DC | PRN

## 2014-06-27 NOTE — Telephone Encounter (Signed)
Patient had been to [facility] ED complaining of continued stomach pain and constipation.  He was given an enema and liquid drink laxative.  He has had bowel movements with good results but has had nausea ongoing.  Script for phenergan sent to [facility].

## 2014-06-27 NOTE — Telephone Encounter (Signed)
LMOM, med at [facility]

## 2014-07-02 DIAGNOSIS — I10 Essential (primary) hypertension: Secondary | ICD-10-CM | POA: Diagnosis not present

## 2014-07-02 DIAGNOSIS — M1990 Unspecified osteoarthritis, unspecified site: Secondary | ICD-10-CM | POA: Diagnosis not present

## 2014-07-02 DIAGNOSIS — E039 Hypothyroidism, unspecified: Secondary | ICD-10-CM | POA: Diagnosis not present

## 2014-07-02 DIAGNOSIS — R338 Other retention of urine: Secondary | ICD-10-CM | POA: Diagnosis not present

## 2014-07-02 DIAGNOSIS — R339 Retention of urine, unspecified: Secondary | ICD-10-CM | POA: Diagnosis not present

## 2014-07-02 DIAGNOSIS — Z79899 Other long term (current) drug therapy: Secondary | ICD-10-CM | POA: Diagnosis not present

## 2014-07-02 DIAGNOSIS — F418 Other specified anxiety disorders: Secondary | ICD-10-CM | POA: Diagnosis not present

## 2014-07-02 DIAGNOSIS — M5136 Other intervertebral disc degeneration, lumbar region: Secondary | ICD-10-CM | POA: Diagnosis not present

## 2014-07-03 DIAGNOSIS — M25511 Pain in right shoulder: Secondary | ICD-10-CM | POA: Diagnosis not present

## 2014-07-03 DIAGNOSIS — K5900 Constipation, unspecified: Secondary | ICD-10-CM | POA: Diagnosis not present

## 2014-07-03 DIAGNOSIS — M5416 Radiculopathy, lumbar region: Secondary | ICD-10-CM | POA: Diagnosis not present

## 2014-07-03 DIAGNOSIS — G4733 Obstructive sleep apnea (adult) (pediatric): Secondary | ICD-10-CM | POA: Diagnosis not present

## 2014-07-16 ENCOUNTER — Encounter: Payer: Self-pay | Admitting: Family

## 2014-07-16 ENCOUNTER — Ambulatory Visit (INDEPENDENT_AMBULATORY_CARE_PROVIDER_SITE_OTHER): Payer: Medicare Other | Admitting: Family

## 2014-07-16 VITALS — BP 120/80 | HR 67 | Temp 97.9°F | Ht 73.0 in | Wt 268.8 lb

## 2014-07-16 DIAGNOSIS — M25561 Pain in right knee: Secondary | ICD-10-CM | POA: Diagnosis not present

## 2014-07-16 DIAGNOSIS — F331 Major depressive disorder, recurrent, moderate: Secondary | ICD-10-CM | POA: Diagnosis not present

## 2014-07-16 DIAGNOSIS — E291 Testicular hypofunction: Secondary | ICD-10-CM

## 2014-07-16 DIAGNOSIS — E349 Endocrine disorder, unspecified: Secondary | ICD-10-CM

## 2014-07-16 DIAGNOSIS — M25512 Pain in left shoulder: Secondary | ICD-10-CM | POA: Diagnosis not present

## 2014-07-16 DIAGNOSIS — M25562 Pain in left knee: Secondary | ICD-10-CM

## 2014-07-16 MED ORDER — METHYLPREDNISOLONE ACETATE 80 MG/ML IJ SUSP
80.0000 mg | Freq: Once | INTRAMUSCULAR | Status: AC
  Administered 2014-07-16: 80 mg via INTRAMUSCULAR

## 2014-07-16 NOTE — Progress Notes (Signed)
Subjective:    Patient ID: SEATON HOFMANN, male    DOB: March 07, 1946, 69 y.o.   MRN: [id number]  HPI Pt presents to the office to recheck depression. Pt was started on Celexa and states this seems to helping. Pt states he can tell a big difference from that vs the Zoloft. Pt states his fatigue has slightly improved, but still feeling fatigue. Pt states he is taking testosterone, but would like to have his testosterone levels checked. He is also complaining of left shoulder pain with tingling.  Pt states that started about 2-3 weeks ago. Pt states movements seem to make it worse. Pt states the pain is intermittent of 3 out 10.    Review of Systems  Constitutional: Negative.   HENT: Negative.   Respiratory: Negative.   Cardiovascular: Negative.   Gastrointestinal: Negative.   Endocrine: Negative.   Genitourinary: Negative.   Musculoskeletal: Negative.   Neurological: Negative.   Hematological: Negative.   Psychiatric/Behavioral: Negative.   All other systems reviewed and are negative.      Objective:   Physical Exam  Constitutional: He is oriented to person, place, and time. He appears well-developed and well-nourished. No distress.  HENT:  Head: Normocephalic.  Eyes: Pupils are equal, round, and reactive to light. Right eye exhibits no discharge. Left eye exhibits no discharge.  Neck: Normal range of motion. Neck supple. No thyromegaly present.  Cardiovascular: Normal rate, regular rhythm, normal heart sounds and intact distal pulses.   No murmur heard. Pulmonary/Chest: Effort normal and breath sounds normal. No respiratory distress. He has no wheezes.  Abdominal: Soft. Bowel sounds are normal. He exhibits no distension. There is no tenderness.  Musculoskeletal: Normal range of motion. He exhibits no edema or tenderness.  Neurological: He is alert and oriented to person, place, and time. He has normal reflexes. No cranial nerve deficit.  Skin: Skin is warm and dry. No rash  noted. No erythema.  Psychiatric: He has a normal mood and affect. His behavior is normal. Judgment and thought content normal.  Vitals reviewed.   BP 120/80 mmHg  Pulse 67  Temp(Src) 97.9 F (36.6 C) (Oral)  Ht 6' 1" (1.854 m)  Wt 268 lb 12.8 oz (121.927 kg)  BMI 35.47 kg/m2       Assessment & Plan:  1. Major depressive disorder, recurrent episode, moderate -Stress management discussed -Continue medications - CMP14+EGFR  2. Testosterone deficiency -Lab pending - Testosterone,Free and Total - CMP14+EGFR  3. Bilateral knee pain -Rest -Ice/heat as needed -Pt on pain medications- keep appt with pain clinic - methylPREDNISolone acetate (DEPO-MEDROL) injection 80 mg; Inject 1 mL (80 mg total) into the muscle once.  4. Left shoulder pain - methylPREDNISolone acetate (DEPO-MEDROL) injection 80 mg; Inject 1 mL (80 mg total) into the muscle once.  Evelina Dun, FNP

## 2014-07-16 NOTE — Patient Instructions (Signed)
Testosterone This test is used to determine if your testosterone level is abnormal. This could be used to explain difficulty getting an erection (erectile dysfunction), inability of your partner to get pregnant (infertility), premature or delayed puberty if you are male, or the appearance of masculine physical features if you are male. PREPARATION FOR TEST A blood sample is obtained by inserting a needle into a vein in the arm. NORMAL FINDINGS  Free Testosterone: 0.3-2 pg/mL  % Free Testosterone: 0.1%-0.3% Total Testosterone:  7 mos-9 yrs (Tanner Stage I)  Male: Less than 30 ng/dL  Male: Less than 30 ng/dL  10-13 yrs (Tanner Stage II)  Male: Less than 300 ng/dL  Male: Less than 40 ng/dL  14-15 yrs (Tanner Stage III)  Male: 170-540 ng/dL  Male: Less than 60 ng/dL  16-19 yrs (Tanner Stage IV, V)  Male: 250-910 ng/dL  Male: Less than 70 ng/dL  20 yrs and over  Male: [phone] ng/dL  Male: Less than 70 ng/dL Ranges for normal findings may vary among different laboratories and hospitals. You should always check with your doctor after having lab work or other tests done to discuss the meaning of your test results and whether your values are considered within normal limits. MEANING OF TEST  Your caregiver will go over the test results with you and discuss the importance and meaning of your results, as well as treatment options and the need for additional tests if necessary. OBTAINING THE TEST RESULTS It is your responsibility to obtain your test results. Ask the lab or department performing the test when and how you will get your results. Document Released: 03/24/2004 Document Revised: 05/30/2011 Document Reviewed: 07/03/2013 [facility] Patient Information 2015 [place], [place]. This information is not intended to replace advice given to you by your health care provider. Make sure you discuss any questions you have with your health care provider. Depression Depression  refers to feeling sad, low, down in the dumps, blue, gloomy, or empty. In general, there are two kinds of depression:  Normal sadness or normal grief. This kind of depression is one that we all feel from time to time after upsetting life experiences, such as the loss of a job or the ending of a relationship. This kind of depression is considered normal, is short lived, and resolves within a few days to 2 weeks. Depression experienced after the loss of a loved one (bereavement) often lasts longer than 2 weeks but normally gets better with time.  Clinical depression. This kind of depression lasts longer than normal sadness or normal grief or interferes with your ability to function at home, at work, and in school. It also interferes with your personal relationships. It affects almost every aspect of your life. Clinical depression is an illness. Symptoms of depression can also be caused by conditions other than those mentioned above, such as:  Physical illness. Some physical illnesses, including underactive thyroid gland (hypothyroidism), severe anemia, specific types of cancer, diabetes, uncontrolled seizures, heart and lung problems, strokes, and chronic pain are commonly associated with symptoms of depression.  Side effects of some prescription medicine. In some people, certain types of medicine can cause symptoms of depression.  Substance abuse. Abuse of alcohol and illicit drugs can cause symptoms of depression. SYMPTOMS Symptoms of normal sadness and normal grief include the following:  Feeling sad or crying for short periods of time.  Not caring about anything (apathy).  Difficulty sleeping or sleeping too much.  No longer able to enjoy the things you used  to enjoy.  Desire to be by oneself all the time (social isolation).  Lack of energy or motivation.  Difficulty concentrating or remembering.  Change in appetite or weight.  Restlessness or agitation. Symptoms of clinical  depression include the same symptoms of normal sadness or normal grief and also the following symptoms:  Feeling sad or crying all the time.  Feelings of guilt or worthlessness.  Feelings of hopelessness or helplessness.  Thoughts of suicide or the desire to harm yourself (suicidal ideation).  Loss of touch with reality (psychotic symptoms). Seeing or hearing things that are not real (hallucinations) or having false beliefs about your life or the people around you (delusions and paranoia). DIAGNOSIS  The diagnosis of clinical depression is usually based on how bad the symptoms are and how long they have lasted. Your health care provider will also ask you questions about your medical history and substance use to find out if physical illness, use of prescription medicine, or substance abuse is causing your depression. Your health care provider may also order blood tests. TREATMENT  Often, normal sadness and normal grief do not require treatment. However, sometimes antidepressant medicine is given for bereavement to ease the depressive symptoms until they resolve. The treatment for clinical depression depends on how bad the symptoms are but often includes antidepressant medicine, counseling with a mental health professional, or both. Your health care provider will help to determine what treatment is best for you. Depression caused by physical illness usually goes away with appropriate medical treatment of the illness. If prescription medicine is causing depression, talk with your health care provider about stopping the medicine, decreasing the dose, or changing to another medicine. Depression caused by the abuse of alcohol or illicit drugs goes away when you stop using these substances. Some adults need professional help in order to stop drinking or using drugs. SEEK IMMEDIATE MEDICAL CARE IF:  You have thoughts about hurting yourself or others.  You lose touch with reality (have psychotic  symptoms).  You are taking medicine for depression and have a serious side effect. FOR MORE INFORMATION  National [employer] on Mental Illness: www.nami.[employer]: https://carter.com/ Document Released: 03/04/2000 Document Revised: 07/22/2013 Document Reviewed: 06/06/2011 [facility] Patient Information 2015 [place], [place]. This information is not intended to replace advice given to you by your health care provider. Make sure you discuss any questions you have with your health care provider.

## 2014-07-17 LAB — CMP14+EGFR
ALT: 186 IU/L — ABNORMAL HIGH (ref 0–44)
AST: 92 IU/L — ABNORMAL HIGH (ref 0–40)
Albumin/Globulin Ratio: 1.7 (ref 1.1–2.5)
Albumin: 4.2 g/dL (ref 3.6–4.8)
Alkaline Phosphatase: 132 IU/L — ABNORMAL HIGH (ref 39–117)
BILIRUBIN TOTAL: 0.5 mg/dL (ref 0.0–1.2)
BUN/Creatinine Ratio: 14 (ref 10–22)
BUN: 14 mg/dL (ref 8–27)
CO2: 26 mmol/L (ref 18–29)
CREATININE: 1.02 mg/dL (ref 0.76–1.27)
Calcium: 9.3 mg/dL (ref 8.6–10.2)
Chloride: 99 mmol/L (ref 97–108)
GFR calc Af Amer: 88 mL/min/{1.73_m2} (ref >59)
GFR calc non Af Amer: 76 mL/min/{1.73_m2} (ref >59)
Globulin, Total: 2.5 g/dL (ref 1.5–4.5)
Glucose: 80 mg/dL (ref 65–99)
Potassium: 5.2 mmol/L (ref 3.5–5.2)
Sodium: 140 mmol/L (ref 134–144)
TOTAL PROTEIN: 6.7 g/dL (ref 6.0–8.5)

## 2014-07-17 LAB — TESTOSTERONE,FREE AND TOTAL
Testosterone, Free: 5.3 pg/mL — ABNORMAL LOW (ref 6.6–18.1)
Testosterone: 1134 ng/dL (ref 348–1197)

## 2014-07-18 ENCOUNTER — Other Ambulatory Visit: Payer: Self-pay | Admitting: Family

## 2014-07-18 DIAGNOSIS — R748 Abnormal levels of other serum enzymes: Secondary | ICD-10-CM

## 2014-07-21 ENCOUNTER — Other Ambulatory Visit: Payer: Self-pay | Admitting: Family

## 2014-07-22 DIAGNOSIS — R339 Retention of urine, unspecified: Secondary | ICD-10-CM | POA: Diagnosis not present

## 2014-07-24 ENCOUNTER — Encounter: Payer: Self-pay | Admitting: Physician Assistant

## 2014-07-30 DIAGNOSIS — R339 Retention of urine, unspecified: Secondary | ICD-10-CM | POA: Diagnosis not present

## 2014-07-30 DIAGNOSIS — R3911 Hesitancy of micturition: Secondary | ICD-10-CM | POA: Diagnosis not present

## 2014-07-31 DIAGNOSIS — M25511 Pain in right shoulder: Secondary | ICD-10-CM | POA: Diagnosis not present

## 2014-07-31 DIAGNOSIS — M1990 Unspecified osteoarthritis, unspecified site: Secondary | ICD-10-CM | POA: Diagnosis not present

## 2014-07-31 DIAGNOSIS — M5416 Radiculopathy, lumbar region: Secondary | ICD-10-CM | POA: Diagnosis not present

## 2014-07-31 DIAGNOSIS — K5900 Constipation, unspecified: Secondary | ICD-10-CM | POA: Diagnosis not present

## 2014-08-11 ENCOUNTER — Encounter: Payer: Self-pay | Admitting: Physician Assistant

## 2014-08-11 ENCOUNTER — Ambulatory Visit (INDEPENDENT_AMBULATORY_CARE_PROVIDER_SITE_OTHER): Payer: Medicare Other | Admitting: Physician Assistant

## 2014-08-11 ENCOUNTER — Other Ambulatory Visit (INDEPENDENT_AMBULATORY_CARE_PROVIDER_SITE_OTHER): Payer: Medicare Other

## 2014-08-11 VITALS — BP 142/86 | HR 80 | Ht 73.0 in | Wt 271.4 lb

## 2014-08-11 DIAGNOSIS — R945 Abnormal results of liver function studies: Principal | ICD-10-CM

## 2014-08-11 DIAGNOSIS — R1084 Generalized abdominal pain: Secondary | ICD-10-CM

## 2014-08-11 DIAGNOSIS — R7989 Other specified abnormal findings of blood chemistry: Secondary | ICD-10-CM

## 2014-08-11 DIAGNOSIS — R799 Abnormal finding of blood chemistry, unspecified: Secondary | ICD-10-CM | POA: Diagnosis not present

## 2014-08-11 LAB — HEPATIC FUNCTION PANEL
ALBUMIN: 4.2 g/dL (ref 3.5–5.2)
ALT: 68 U/L — ABNORMAL HIGH (ref 0–53)
AST: 40 U/L — ABNORMAL HIGH (ref 0–37)
Alkaline Phosphatase: 114 U/L (ref 39–117)
Bilirubin, Direct: 0.1 mg/dL (ref 0.0–0.3)
Total Bilirubin: 0.4 mg/dL (ref 0.2–1.2)
Total Protein: 7.5 g/dL (ref 6.0–8.3)

## 2014-08-11 MED ORDER — OMEPRAZOLE 20 MG PO CPDR: 20.0000 mg | DELAYED_RELEASE_CAPSULE | Freq: Every day | ORAL | Status: DC

## 2014-08-11 NOTE — Progress Notes (Addendum)
Patient ID: Kenneth Rhodes, male   DOB: 1945-06-01, 69 y.o.   MRN: [id number]   Subjective:    Patient ID: Kenneth Rhodes, male    DOB: [birth year], 69 y.o.   MRN: [id number]  HPI Kenneth Rhodes is a  69 year old white male , new to GI today referred by Evelina Dun NP / [facility]  For evaluation of elevated liver function studies. Patient has had 2 sets of LFTs done over the past 2 months. Initially alkaline phosphatase was 132 AST 74 and ALT 91 , but when repeated earlier this month alkaline phosphatase 132 AST of 92 and ALT of 186. Patient has no prior history of any liver problems that he is aware of. He has recently relocated to [facility] from [facility]. He has history of severe degenerative disc disease and opioid dependence , major depression. He had a disc fusion done in 2008. He then contracted MRSA in July 2015 after a spinal injection. This culminated in him becoming paralyzed and requiring an urgent surgery which was done in [facility]. He says he was basically immobile for 6-8 weeks and developed what sounds like a neurogenic bladder and bowel problem both of which are still somewhat problematic. He has had severe problems with constipation. He is still wearing a catheter. He also has been on Suboxone over the past 3 months. He says his pain control issues got "out-of-control". He denies any ongoing EtOH use has no history of hepatitis etc.  He says he is been having abdominal pains especially early in the mornings over the past month or so which she describes as an aching throbbing gnawing-type pain and some occasional nausea.  He is on Mobic daily as well. Patient relates having prior colonoscopy done less than 5 years ago in [facility] and says he  Was  told this was normal.  Review of Systems Pertinent positive and negative review of systems were noted in the above HPI section.  All other review of systems was otherwise  negative.  Outpatient Encounter Prescriptions as of 08/11/2014  Medication Sig  . citalopram (CELEXA) 40 MG tablet Take 1 tablet (40 mg total) by mouth daily.  Marland Kitchen levothyroxine (SYNTHROID, LEVOTHROID) 125 MCG tablet TAKE 1 TABLET BY MOUTH EVERY DAY NOTE DOSAGE CHANGE  . LYRICA 150 MG capsule Take 150 mg by mouth 2 (two) times daily.  . meloxicam (MOBIC) 15 MG tablet Take 15 mg by mouth daily.  . promethazine (PHENERGAN) 25 MG tablet Take 1 tablet (25 mg total) by mouth every 6 (six) hours as needed for nausea or vomiting.  . SUBOXONE 12-3 MG FILM   . tamsulosin (FLOMAX) 0.4 MG CAPS capsule Take 0.4 mg by mouth daily after supper.   . testosterone (ANDROGEL) 50 MG/5GM (1%) GEL Place 5 g onto the skin daily.  . Vitamin D, Ergocalciferol, (DRISDOL) 50000 UNITS CAPS capsule Take 1 capsule (50,000 Units total) by mouth every 7 (seven) days.  Marland Kitchen omeprazole (PRILOSEC) 20 MG capsule Take 1 capsule (20 mg total) by mouth daily.  . [DISCONTINUED] diltiazem (DILACOR XR) 240 MG 24 hr capsule TAKE 1 CAPSULE (240 MG TOTAL) BY MOUTH DAILY.   Facility-Administered Encounter Medications as of 08/11/2014  Medication  . methylPREDNISolone acetate (DEPO-MEDROL) injection 80 mg   Allergies  Allergen Reactions  . Hydrocodone Nausea Only   Patient Active Problem List   Diagnosis Date Noted  . Testosterone deficiency 06/09/2014  . Vitamin D deficiency 06/09/2014  . Opiate addiction  03/26/2014  . Opiate withdrawal 03/26/2014  . Depression, major, recurrent 03/26/2014  . Suicidal ideation 03/26/2014  . Chronic pain 03/26/2014  . Lumbar radiculopathy, chronic 03/26/2014  . Hypothyroidism 03/26/2014  . BPH (benign prostatic hyperplasia) 03/26/2014  . Bladder spasm 03/26/2014  . Opioid dependence with opioid-induced mood disorder    History   Social History  . Marital Status: Legally Separated    Spouse Name: N/A  . Number of Children: 2  . Years of Education: N/A   Occupational History  . Retired     Social History Main Topics  . Smoking status: Former Smoker    Quit date: 03/21/1994  . Smokeless tobacco: Never Used  . Alcohol Use: No  . Drug Use: No     Comment: heroin  . Sexual Activity: Not on file   Other Topics Concern  . Not on file   Social History Narrative    Kenneth Rhodes's family history includes Ovarian cancer in his mother.      Objective:    Filed Vitals:   08/11/14 1023  BP: 142/86  Pulse: 80    Physical Exam   Well-developed older white male in no acute distress, blood pressure 142/86 pulse 80 height 6 foot 1 weight 271, BMI 35.8. HEENT; nontraumatic normocephalic EOMI PERRLA sclera anicteric, Supple ;no JVD, Cardiovascular ;regular rate and rhythm with S1-S2 no murmur or gallop, Pulmonary; clear bilaterally, Abdomen; obese soft, nontender, nondistended, bowel sounds are active there is no palpable mass or hepatosplenomegaly, Rectal; exam not done, Extremities ;no clubbing cyanosis or edema skin warm and dry, Neuropsych; mood and affect appropriate       Assessment & Plan:   #1 69 yo WM with elevated LFT's of unclear etiology. Also with mid abdominal pian intermittent x one month. (normal LFTS 4 months ago) High risk for PUD/NSAID induced gastropathy Also will r/o GB disease, and chronic liver disease #2 narcotic dependence- now on Suboxone #3 Chronic constipation #4 ?neurogenic bladder #5severe DDD #6 hx MRSA /spine 2015-complicated surgery 11/3233 [place] #7 depression  Plan; Schedule abd [place] Start prilosec 20 mg po QAM Check hepatic panel, hepatitis serologies ,ANA Further workup pending above Office follow up one month  Addendum; records received fro [facility]- EGD */2015- Grade D reflux esophagitis. No colonoscopy record  Deneane Stifter Genia Harold PA-C 08/11/2014   Cc: Sharion Balloon, FNP

## 2014-08-11 NOTE — Progress Notes (Signed)
Agree with initial assessment and plans as outlined 

## 2014-08-11 NOTE — Patient Instructions (Signed)
Please go to the basement level to have your labs drawn.  We sent a prescription for Prilosec ( Omeprazole) 20 mg to [place], [place].  You have been scheduled for an abdominal ultrasound at [facility]iology (1st floor of hospital) on  Thursday 08-15-2014 at 9:30 am . Please arrive at 9:15 am  prior to your appointment for registration. Make certain not to have anything to eat or drink 6 hours prior to your appointment. Should you need to reschedule your appointment, please contact radiology at [phone]. This test typically takes about 30 minutes to perform.  We also made you a follow up appointment with Dr. Scarlette Shorts for 09-12-2014 at 1:30 PM .

## 2014-08-12 ENCOUNTER — Telehealth: Payer: Self-pay | Admitting: Internal Medicine

## 2014-08-12 LAB — HEPATITIS B SURFACE ANTIGEN: Hepatitis B Surface Ag: NEGATIVE

## 2014-08-12 LAB — HEPATITIS B SURFACE ANTIBODY,QUALITATIVE: Hep B S Ab: NEGATIVE

## 2014-08-12 LAB — ANA: Anti Nuclear Antibody(ANA): NEGATIVE

## 2014-08-12 LAB — HEPATITIS C ANTIBODY: HCV Ab: REACTIVE — AB

## 2014-08-12 NOTE — Telephone Encounter (Signed)
Pt states he was started on omeprazole yesterday, states he took one last night and one this morning. Pt states he is having very bad abdominal pain today and has had 8 watery diarrhea stools. Please advise.

## 2014-08-12 NOTE — Telephone Encounter (Signed)
He can stop prilosec for now - send rx for pepcid 40 mg po bid -- monitor diarrhea ,imodium as needed , and get US done- already ordered

## 2014-08-13 LAB — HEPATITIS C RNA QUANTITATIVE
HCV Quantitative Log: 5.85 {Log} — ABNORMAL HIGH (ref <1.18)
HCV Quantitative: 715703 IU/mL — ABNORMAL HIGH (ref <15)

## 2014-08-13 MED ORDER — FAMOTIDINE 40 MG PO TABS: 40.0000 mg | ORAL_TABLET | Freq: Two times a day (BID) | ORAL | Status: DC

## 2014-08-13 NOTE — Telephone Encounter (Signed)
Spoke with pt and script sent to the pharmacy. Let pt know lab results are not back yet.

## 2014-08-14 ENCOUNTER — Telehealth: Payer: Self-pay

## 2014-08-14 ENCOUNTER — Ambulatory Visit (HOSPITAL_COMMUNITY): Payer: Medicare Other

## 2014-08-14 NOTE — Telephone Encounter (Signed)
Called patient to advise of his Hep C referral. He reports his stomach still hurts. He has taken Pepcid 1 day. Not indigestion or gas. States he has a cramping pain in his gut and diarrhea. He is a little vague in the description. He takes "a lot of Imodium." Please advise.

## 2014-08-14 NOTE — Telephone Encounter (Signed)
He c/o chronic constipation when I saw him... Not sure why diarrhea now... He can take imodium as needed- has only had diarrhea a few days... If lasts longer than a week or so ongoing we can pursue further

## 2014-08-15 NOTE — Telephone Encounter (Signed)
Patient is instructed. He insists on scheduling an appointment. Given first available 08/22/14

## 2014-08-21 ENCOUNTER — Ambulatory Visit (HOSPITAL_COMMUNITY)
Admission: RE | Admit: 2014-08-21 | Discharge: 2014-08-21 | Disposition: A | Discharge status: Home / Self Care | Payer: Medicare Other | Source: Ambulatory Visit | Attending: Physician Assistant | Admitting: Physician Assistant

## 2014-08-21 ENCOUNTER — Telehealth: Payer: Self-pay

## 2014-08-21 DIAGNOSIS — R1084 Generalized abdominal pain: Secondary | ICD-10-CM | POA: Diagnosis not present

## 2014-08-21 DIAGNOSIS — N281 Cyst of kidney, acquired: Secondary | ICD-10-CM | POA: Insufficient documentation

## 2014-08-21 DIAGNOSIS — R7989 Other specified abnormal findings of blood chemistry: Secondary | ICD-10-CM

## 2014-08-21 DIAGNOSIS — R945 Abnormal results of liver function studies: Secondary | ICD-10-CM

## 2014-08-21 NOTE — Telephone Encounter (Signed)
Patient advised [facility] has his records and will be contacting him to schedule an appointment.

## 2014-08-22 ENCOUNTER — Ambulatory Visit (INDEPENDENT_AMBULATORY_CARE_PROVIDER_SITE_OTHER): Payer: Medicare Other | Admitting: Physician Assistant

## 2014-08-22 ENCOUNTER — Other Ambulatory Visit (INDEPENDENT_AMBULATORY_CARE_PROVIDER_SITE_OTHER): Payer: Medicare Other

## 2014-08-22 ENCOUNTER — Encounter: Payer: Self-pay | Admitting: Physician Assistant

## 2014-08-22 VITALS — BP 130/86 | HR 84 | Ht 73.0 in | Wt 268.4 lb

## 2014-08-22 DIAGNOSIS — R197 Diarrhea, unspecified: Secondary | ICD-10-CM

## 2014-08-22 DIAGNOSIS — B182 Chronic viral hepatitis C: Secondary | ICD-10-CM | POA: Diagnosis not present

## 2014-08-22 DIAGNOSIS — R1033 Periumbilical pain: Secondary | ICD-10-CM | POA: Diagnosis not present

## 2014-08-22 DIAGNOSIS — R109 Unspecified abdominal pain: Secondary | ICD-10-CM | POA: Diagnosis not present

## 2014-08-22 LAB — CBC WITH DIFFERENTIAL/PLATELET
BASOS PCT: 1 % (ref 0.0–3.0)
Basophils Absolute: 0.1 10*3/uL (ref 0.0–0.1)
EOS ABS: 0.1 10*3/uL (ref 0.0–0.7)
Eosinophils Relative: 1 % (ref 0.0–5.0)
HEMATOCRIT: 38.8 % — AB (ref 39.0–52.0)
Hemoglobin: 13.1 g/dL (ref 13.0–17.0)
LYMPHS ABS: 1.3 10*3/uL (ref 0.7–4.0)
LYMPHS PCT: 23.8 % (ref 12.0–46.0)
MCHC: 33.8 g/dL (ref 30.0–36.0)
MCV: 82.7 fl (ref 78.0–100.0)
Monocytes Absolute: 0.5 10*3/uL (ref 0.1–1.0)
Monocytes Relative: 9.3 % (ref 3.0–12.0)
NEUTROS PCT: 64.9 % (ref 43.0–77.0)
Neutro Abs: 3.6 10*3/uL (ref 1.4–7.7)
PLATELETS: 216 10*3/uL (ref 150.0–400.0)
RBC: 4.69 Mil/uL (ref 4.22–5.81)
RDW: 15.1 % (ref 11.5–15.5)
WBC: 5.6 10*3/uL (ref 4.0–10.5)

## 2014-08-22 LAB — BASIC METABOLIC PANEL
BUN: 13 mg/dL (ref 6–23)
CO2: 32 meq/L (ref 19–32)
CREATININE: 1.02 mg/dL (ref 0.40–1.50)
Calcium: 9.2 mg/dL (ref 8.4–10.5)
Chloride: 102 mEq/L (ref 96–112)
GFR: 77.4 mL/min (ref >60.00)
Glucose, Bld: 80 mg/dL (ref 70–99)
POTASSIUM: 4.6 meq/L (ref 3.5–5.1)
SODIUM: 136 meq/L (ref 135–145)

## 2014-08-22 MED ORDER — DICYCLOMINE HCL 10 MG PO CAPS: 10.0000 mg | ORAL_CAPSULE | Freq: Three times a day (TID) | ORAL | Status: DC

## 2014-08-22 MED ORDER — SACCHAROMYCES BOULARDII 250 MG PO CAPS: 250.0000 mg | ORAL_CAPSULE | Freq: Two times a day (BID) | ORAL | Status: DC

## 2014-08-22 NOTE — Progress Notes (Signed)
Patient ID: Kenneth Rhodes, male   DOB: [birth year], 69 y.o.   MRN: [id number]   Subjective:    Patient ID: Kenneth Rhodes, male    DOB: [birth year], 69 y.o.   MRN: [id number]  HPI [name] is a 69 year old white male who was initially seen here in consultation in about 2-1/2 weeks ago and at that time referred for elevated LFTs. He has been found to be hepatitis C positive with a high viral load and has been referred to the hep C clinic for management. Patient has history of opiate addiction/abuse and has been on Suboxone over the past several months. He has history of severe disc disease and a contracted MRSA in July 2015 after a spinal injection. This resulted in a long hospitalization temporary paralysis and he developed a neurogenic bladder and bowel problem which have still been bothering him. He still has an indwelling catheter. He related having some recent abdominal pain which she described as a gnawing sensation. He has been on daily NSAIDs in the form of Mobic. During the last office visit he was started on Prilosec 20 mg by mouth daily. He called back a day after starting it stating that he was having severe diarrhea area did unclear whether this was related to the medication by Prilosec was stopped and he was switched to Pepcid 40 mg by mouth twice a day. He comes back in today stating that he been having problems with diarrhea over this past week. He was a having at least 10-12 watery stools per day and continues to complain of crampy mid abdominal pain which she says feels somewhat gnawing and hunger like and also crampy. He has not had any fever or chills no nausea or vomiting no melena or hematochezia. He says the diarrhea has actually eased up and he is only had 2 bowel movements today. He is concerned about his ongoing abdominal discomfort as well. He states that he wonders if some of his GI symptoms are related to the Suboxone. He says some of the sensations he has remind him of a  withdrawal type  feeling in his abdomen.  Review of Systems Pertinent positive and negative review of systems were noted in the above HPI section.  All other review of systems was otherwise negative.  Outpatient Encounter Prescriptions as of 08/22/2014  Medication Sig  . citalopram (CELEXA) 40 MG tablet Take 1 tablet (40 mg total) by mouth daily.  . famotidine (PEPCID) 40 MG tablet Take 1 tablet (40 mg total) by mouth 2 (two) times daily.  Marland Kitchen levothyroxine (SYNTHROID, LEVOTHROID) 125 MCG tablet TAKE 1 TABLET BY MOUTH EVERY DAY NOTE DOSAGE CHANGE  . LYRICA 150 MG capsule Take 150 mg by mouth 2 (two) times daily.  . meloxicam (MOBIC) 15 MG tablet Take 15 mg by mouth daily.  . promethazine (PHENERGAN) 25 MG tablet Take 1 tablet (25 mg total) by mouth every 6 (six) hours as needed for nausea or vomiting.  . SUBOXONE 12-3 MG FILM   . tamsulosin (FLOMAX) 0.4 MG CAPS capsule Take 0.4 mg by mouth daily after supper.   . testosterone (ANDROGEL) 50 MG/5GM (1%) GEL Place 5 g onto the skin daily.  . Vitamin D, Ergocalciferol, (DRISDOL) 50000 UNITS CAPS capsule Take 1 capsule (50,000 Units total) by mouth every 7 (seven) days.  . [DISCONTINUED] omeprazole (PRILOSEC) 20 MG capsule Take 1 capsule (20 mg total) by mouth daily.   Facility-Administered Encounter Medications as of 08/22/2014  Medication  . methylPREDNISolone acetate (  DEPO-MEDROL) injection 80 mg   Allergies  Allergen Reactions  . Hydrocodone Nausea Only   Patient Active Problem List   Diagnosis Date Noted  . Testosterone deficiency 06/09/2014  . Vitamin D deficiency 06/09/2014  . Opiate addiction 03/26/2014  . Opiate withdrawal 03/26/2014  . Depression, major, recurrent 03/26/2014  . Suicidal ideation 03/26/2014  . Chronic pain 03/26/2014  . Lumbar radiculopathy, chronic 03/26/2014  . Hypothyroidism 03/26/2014  . BPH (benign prostatic hyperplasia) 03/26/2014  . Bladder spasm 03/26/2014  . Opioid dependence with opioid-induced mood  disorder    History   Social History  . Marital Status: Legally Separated    Spouse Name: N/A  . Number of Children: 2  . Years of Education: N/A   Occupational History  . Retired    Social History Main Topics  . Smoking status: Former Smoker    Quit date: 03/21/1994  . Smokeless tobacco: Never Used  . Alcohol Use: No  . Drug Use: No     Comment: heroin  . Sexual Activity: Not on file   Other Topics Concern  . Not on file   Social History Narrative    Mr. Printz's family history includes Ovarian cancer in his mother.      Objective:    Filed Vitals:   08/22/14 1438  BP: 130/86  Pulse: 84    Physical Exam  well-developed older white male in no acute distress, blood pressure 130/86 pulse 84 height 6 foot 1 weight 268 BMI 35, HEENT; nontraumatic normocephalic EOMI PERRLA sclera anicteric, Supple; no JVD, Cardiovascular; regular rate and rhythm with S1-S2 no murmur rub or gallop, Pulmonary; clear bilaterally, Abdomen; soft obese he has some tenderness in the mid abdomen and. Umbilical area and hypogastric M no guarding or rebound no palpable mass or hepatosplenomegaly bowel sounds are present, Rectal ;exam not done, Extremities ;no clubbing cyanosis or edema skin warm and dry, Psych; mood and affect appropriate affect is generally flat       Assessment & Plan:   #1 69 yo male with acute diarrhea x one week - currently  improving without specific therapy.  R/O cdiff, vs viral #2 persistent hypogastric and periumbilical pain- r/o Nsaid induced PUD/Gastropathy #3 new dx of  Hepatitis C #4 hx of opiate addiction /abuse- on Suboxone #5Chronic back pain/disc fusion and infection  Plan; Stop Mobic Continue Pepcid 40 mg po BID for now  Schedule for EGD with Dr. Henrene Pastor -procedure discussed in detail with pt and he is agreeable to proceed  Check stool for cdiff/culture  Add bentyl 10 mg tid prn for cramping  He is awaiting appt at [facility] here in  [place].   Amy Genia Harold PA-C 08/22/2014   Cc: Sharion Balloon, FNP

## 2014-08-22 NOTE — Patient Instructions (Addendum)
Please go to the basement level to have your labs drawn and stool studies. Stop Mobic. Continue Pepcid 40 mg twice daily.  We sent a prescription for Dicyclomine ( Bentyl) 10 mg, take 1 tab by mouth 3 times daily for cramping Also sent one for Florastor, twice daily.[facility] [place], [place], [place].   You have been scheduled for an endoscopy. Please follow written instructions given to you at your visit today. If you use inhalers (even only as needed), please bring them with you on the day of your procedure.   Normal BMI (Body Mass Index- based on height and weight) is between 23 and 30. Your BMI today is Body mass index is 35.42 kg/(m^2). Marland Kitchen Please consider follow up  regarding your BMI with your Primary Care Provider.

## 2014-08-25 NOTE — Progress Notes (Signed)
Agree with initial assessment and plans 

## 2014-08-26 ENCOUNTER — Other Ambulatory Visit: Payer: Medicare Other

## 2014-08-26 DIAGNOSIS — R339 Retention of urine, unspecified: Secondary | ICD-10-CM | POA: Diagnosis not present

## 2014-08-26 DIAGNOSIS — R197 Diarrhea, unspecified: Secondary | ICD-10-CM

## 2014-08-26 DIAGNOSIS — N319 Neuromuscular dysfunction of bladder, unspecified: Secondary | ICD-10-CM | POA: Diagnosis not present

## 2014-08-26 DIAGNOSIS — R109 Unspecified abdominal pain: Secondary | ICD-10-CM

## 2014-08-26 DIAGNOSIS — R1033 Periumbilical pain: Secondary | ICD-10-CM

## 2014-08-27 ENCOUNTER — Telehealth: Payer: Self-pay

## 2014-08-27 LAB — CLOSTRIDIUM DIFFICILE BY PCR: CDIFFPCR: DETECTED — AB

## 2014-08-27 MED ORDER — METRONIDAZOLE 250 MG PO TABS: 250.0000 mg | ORAL_TABLET | Freq: Four times a day (QID) | ORAL | Status: DC

## 2014-08-27 NOTE — Telephone Encounter (Signed)
Metronidazole 250 mg 4 times a day 10 days. Have him follow-up with Amy in 2 weeks. Thanks

## 2014-08-27 NOTE — Telephone Encounter (Signed)
Pt positive for Cdiff, please advise.

## 2014-08-27 NOTE — Telephone Encounter (Signed)
Spoke with pt and he is aware, script sent to pharmacy. Pt already has OV with Dr. Henrene Pastor 09/12/14.

## 2014-08-28 ENCOUNTER — Other Ambulatory Visit (HOSPITAL_COMMUNITY): Payer: Self-pay | Admitting: Nurse Practitioner

## 2014-08-28 DIAGNOSIS — M1990 Unspecified osteoarthritis, unspecified site: Secondary | ICD-10-CM | POA: Diagnosis not present

## 2014-08-28 DIAGNOSIS — K5900 Constipation, unspecified: Secondary | ICD-10-CM | POA: Diagnosis not present

## 2014-08-28 DIAGNOSIS — M25511 Pain in right shoulder: Secondary | ICD-10-CM | POA: Diagnosis not present

## 2014-08-28 DIAGNOSIS — M5416 Radiculopathy, lumbar region: Secondary | ICD-10-CM | POA: Diagnosis not present

## 2014-08-28 DIAGNOSIS — B182 Chronic viral hepatitis C: Secondary | ICD-10-CM | POA: Diagnosis not present

## 2014-08-28 DIAGNOSIS — B1920 Unspecified viral hepatitis C without hepatic coma: Secondary | ICD-10-CM

## 2014-08-29 LAB — STOOL CULTURE

## 2014-09-04 ENCOUNTER — Telehealth: Payer: Self-pay | Admitting: Internal Medicine

## 2014-09-04 MED ORDER — VANCOMYCIN 50 MG/ML ORAL SOLUTION: 250.0000 mg | Freq: Four times a day (QID) | ORAL | Status: DC

## 2014-09-04 NOTE — Telephone Encounter (Signed)
If still having diarrhea - need to switch to Vancomycin 250 mg 4 x daily x 14 days, liquid or tabs whichever cheaper and stop flagyl.  Add Bentyl 10 mg tid ac for pain, cramping

## 2014-09-04 NOTE — Telephone Encounter (Signed)
Patient notified of the recommendations Rx sent to [facility].  Patient is asked to please call before he drives from [place] to make sure it will be ready today. He already has rx for dicyclomine He will call back for any additional questions or concerns.

## 2014-09-04 NOTE — Telephone Encounter (Signed)
Patient ws diagnosed with c-diff.  Dr. Henrene Pastor prescribed metronidazole 250 mg qid for 10 days.  He will finish that today.  He reports terrible abdominal pain especially in the am.  Pain is relieved some by bowel movements.  Usually after 2-3 BM the pain improves.  The pain is periumbilical.  He does have follow up with Dr. Henrene Pastor next week on the 24th.  He states that his stool is very loose.  Amy you evaluated this gentlemen on 08/22/14.  Please advise

## 2014-09-09 ENCOUNTER — Telehealth: Payer: Self-pay | Admitting: Internal Medicine

## 2014-09-09 DIAGNOSIS — R1084 Generalized abdominal pain: Secondary | ICD-10-CM

## 2014-09-09 NOTE — Telephone Encounter (Signed)
Increase to 500 mg qid and other recommendations as previous. Thanks

## 2014-09-09 NOTE — Telephone Encounter (Signed)
Pt is already taking the vancomycin 250mg  QID. Please advise.

## 2014-09-09 NOTE — Telephone Encounter (Signed)
Pt was switched to vancomycin on 09/04/14 from flagyl for cdiff. Pt states he is still having about 7-8 liquid diarrhea stools/day. Also states he is having a lot of abdominal pain and cramping, reports he is taking the bentyl but it does not seem to be helping much. Please advise.

## 2014-09-09 NOTE — Telephone Encounter (Signed)
Increase vancomycin to 250 mg 4 times a day. Check CBC and plain films of the abdomen. Have him seen by extender this week

## 2014-09-10 ENCOUNTER — Other Ambulatory Visit (INDEPENDENT_AMBULATORY_CARE_PROVIDER_SITE_OTHER): Payer: Medicare Other

## 2014-09-10 ENCOUNTER — Ambulatory Visit (INDEPENDENT_AMBULATORY_CARE_PROVIDER_SITE_OTHER)
Admission: RE | Admit: 2014-09-10 | Discharge: 2014-09-10 | Disposition: A | Discharge status: Home / Self Care | Payer: Medicare Other | Source: Ambulatory Visit | Attending: Internal Medicine | Admitting: Internal Medicine

## 2014-09-10 DIAGNOSIS — R1084 Generalized abdominal pain: Secondary | ICD-10-CM

## 2014-09-10 DIAGNOSIS — R197 Diarrhea, unspecified: Secondary | ICD-10-CM | POA: Diagnosis not present

## 2014-09-10 LAB — CBC WITH DIFFERENTIAL/PLATELET
Basophils Absolute: 0 K/uL (ref 0.0–0.1)
Basophils Relative: 0.5 % (ref 0.0–3.0)
Eosinophils Absolute: 0.1 K/uL (ref 0.0–0.7)
Eosinophils Relative: 1.2 % (ref 0.0–5.0)
HCT: 37.6 % — ABNORMAL LOW (ref 39.0–52.0)
Hemoglobin: 12.6 g/dL — ABNORMAL LOW (ref 13.0–17.0)
Lymphocytes Relative: 23.7 % (ref 12.0–46.0)
Lymphs Abs: 1.1 K/uL (ref 0.7–4.0)
MCHC: 33.6 g/dL (ref 30.0–36.0)
MCV: 83.6 fl (ref 78.0–100.0)
Monocytes Absolute: 0.4 K/uL (ref 0.1–1.0)
Monocytes Relative: 7.9 % (ref 3.0–12.0)
Neutro Abs: 3.1 K/uL (ref 1.4–7.7)
Neutrophils Relative %: 66.7 % (ref 43.0–77.0)
Platelets: 180 K/uL (ref 150.0–400.0)
RBC: 4.5 Mil/uL (ref 4.22–5.81)
RDW: 14.8 % (ref 11.5–15.5)
WBC: 4.7 K/uL (ref 4.0–10.5)

## 2014-09-10 NOTE — Telephone Encounter (Signed)
Pt aware and will come for labs and xrays. Pt knows to increase dose to 500mg  QID. Pt has appt already scheduled with Dr. Henrene Pastor for Friday, he will keep this appt.

## 2014-09-11 ENCOUNTER — Ambulatory Visit: Payer: Self-pay | Admitting: Physician Assistant

## 2014-09-11 DIAGNOSIS — M5416 Radiculopathy, lumbar region: Secondary | ICD-10-CM | POA: Diagnosis not present

## 2014-09-11 DIAGNOSIS — R251 Tremor, unspecified: Secondary | ICD-10-CM | POA: Diagnosis not present

## 2014-09-11 DIAGNOSIS — R42 Dizziness and giddiness: Secondary | ICD-10-CM | POA: Diagnosis not present

## 2014-09-11 DIAGNOSIS — M1990 Unspecified osteoarthritis, unspecified site: Secondary | ICD-10-CM | POA: Diagnosis not present

## 2014-09-12 ENCOUNTER — Ambulatory Visit (INDEPENDENT_AMBULATORY_CARE_PROVIDER_SITE_OTHER): Payer: Medicare Other | Admitting: Internal Medicine

## 2014-09-12 ENCOUNTER — Encounter: Payer: Self-pay | Admitting: Internal Medicine

## 2014-09-12 VITALS — BP 144/80 | HR 68 | Ht 71.25 in | Wt 273.0 lb

## 2014-09-12 DIAGNOSIS — B182 Chronic viral hepatitis C: Secondary | ICD-10-CM

## 2014-09-12 DIAGNOSIS — R197 Diarrhea, unspecified: Secondary | ICD-10-CM | POA: Diagnosis not present

## 2014-09-12 DIAGNOSIS — R1084 Generalized abdominal pain: Secondary | ICD-10-CM

## 2014-09-12 DIAGNOSIS — B9689 Other specified bacterial agents as the cause of diseases classified elsewhere: Secondary | ICD-10-CM

## 2014-09-12 DIAGNOSIS — A498 Other bacterial infections of unspecified site: Secondary | ICD-10-CM

## 2014-09-12 MED ORDER — VANCOMYCIN 50 MG/ML ORAL SOLUTION: 500.0000 mg | Freq: Four times a day (QID) | ORAL | Status: DC

## 2014-09-12 NOTE — Progress Notes (Signed)
HISTORY OF PRESENT ILLNESS:  Kenneth Rhodes is a 69 y.o. male with multiple medical problems including chronic back pain, opiate addiction, MRSA spine infection (severe) and hypertension. He was evaluated in this office by the physician assistant, as a new patient, May 23 regarding elevated liver tests and upper abdominal pain. He has a history of GERD with grade D esophagitis on endoscopy in August 2015 in [place]. At the time of his initial visit he was on no reflux medication. He was prescribed Prilosec. Workup of his liver tests revealed that he had hepatitis C. He has been referred to an seen by the medical specialties clinic for assessment and possible treatment of hepatitis C. Shortly after his initial visit he contacted the office complaining of diarrhea. He was evaluated on June 3. Prilosec was changed to famotidine. Stool studies revealed Clostridium difficile for which she was started on metronidazole 250 mg 4 times a day 10 days. He reported no improvement in his bowels. Also complaining of lower abdominal cramping discomfort. He subsequently started vancomycin 250 mg 4 times a day on June 16. He contacted the office June 21 complaining of ongoing symptoms. Vancomycin was increased to 500 mg daily. CBC was unremarkable as was KUB. Follow-up at this time arranged. Patient tells me that in the past 2 days he has had significant improvement. He has had resolution of both abdominal pain and diarrhea. Stools are still soft. Upper abdominal discomfort has improved as well as. He is scheduled for upper endoscopy in a few weeks. No additional GI complaints at this time. Dysphagia.  REVIEW OF SYSTEMS:  All non-GI ROS negative except for anxiety, arthritis, back pain, depression, fatigue, itching, muscle cramps, night sweats, short as of breath, sleeping problems, ankle edema, urinary frequency  Past Medical History  Diagnosis Date  . Hyperlipidemia   . MRSA (methicillin resistant staph aureus)  culture positive   . Hypertension   . Hypothyroidism   . Depression   . Liver disease   . Chronic hepatitis C   . Sleep apnea   . Obesity   . GERD (gastroesophageal reflux disease)   . BPH (benign prostatic hypertrophy)   . C. difficile diarrhea     Past Surgical History  Procedure Laterality Date  . Spine surgery    . Joint replacement Right     knee  . Lumbar epidural injection    . Chronic hep c    . C diff      Social History Kenneth Rhodes  reports that he quit smoking about 20 years ago. He has never used smokeless tobacco. He reports that he does not drink alcohol or use illicit drugs.  family history includes Ovarian cancer in his mother.  Allergies  Allergen Reactions  . Hydrocodone Nausea Only       PHYSICAL EXAMINATION: Vital signs: BP 144/80 mmHg  Pulse 68  Ht 5' 11.25" (1.81 m)  Wt 273 lb (123.832 kg)  BMI 37.80 kg/m2 General: Well-developed, well-nourished, no acute distress HEENT: Sclerae are anicteric, conjunctiva pink. Oral mucosa intact Lungs: Clear Heart: Regular Abdomen: soft, obese, nontender, nondistended, no obvious ascites, no peritoneal signs, normal bowel sounds. No organomegaly. Extremities: Trace  edema Psychiatric: alert and oriented x3. Cooperative   ASSESSMENT:  #1. Clostridium difficile related diarrhea. Improvement over the past 2 days on current dose of vancomycin 500 mg 4 times a day #2. Abdominal discomfort. Resolved #3. History of erosive esophagitis. #4. Hepatitis C. Being seen and evaluated at medical specialties clinic #  5. Multiple medical problems   PLAN:  #1. Refill vancomycin 500 mg 4 times a day 2 weeks. Complete the course of therapy #2. Continue famotidine 40 mg twice daily #3. Reflux precautions #4. Ongoing evaluation at medical specialties clinic for hepatitis C #5. Keep plans for upper endoscopy. Assess status of severe esophagitis. May affect therapeutic recommendations

## 2014-09-12 NOTE — Patient Instructions (Signed)
We have sent the following medications to your pharmacy for you to pick up at your convenience:  Vancomycin  

## 2014-09-15 ENCOUNTER — Ambulatory Visit: Payer: Self-pay | Admitting: Physician Assistant

## 2014-09-24 DIAGNOSIS — R339 Retention of urine, unspecified: Secondary | ICD-10-CM | POA: Diagnosis not present

## 2014-09-24 DIAGNOSIS — N319 Neuromuscular dysfunction of bladder, unspecified: Secondary | ICD-10-CM | POA: Diagnosis not present

## 2014-09-29 ENCOUNTER — Other Ambulatory Visit: Payer: Self-pay | Admitting: Family

## 2014-09-29 DIAGNOSIS — R300 Dysuria: Secondary | ICD-10-CM | POA: Diagnosis not present

## 2014-09-29 DIAGNOSIS — M5416 Radiculopathy, lumbar region: Secondary | ICD-10-CM | POA: Diagnosis not present

## 2014-09-29 DIAGNOSIS — N319 Neuromuscular dysfunction of bladder, unspecified: Secondary | ICD-10-CM | POA: Diagnosis not present

## 2014-09-29 DIAGNOSIS — M1990 Unspecified osteoarthritis, unspecified site: Secondary | ICD-10-CM | POA: Diagnosis not present

## 2014-09-29 DIAGNOSIS — R251 Tremor, unspecified: Secondary | ICD-10-CM | POA: Diagnosis not present

## 2014-09-29 DIAGNOSIS — R42 Dizziness and giddiness: Secondary | ICD-10-CM | POA: Diagnosis not present

## 2014-09-29 DIAGNOSIS — R339 Retention of urine, unspecified: Secondary | ICD-10-CM | POA: Diagnosis not present

## 2014-09-29 DIAGNOSIS — R319 Hematuria, unspecified: Secondary | ICD-10-CM | POA: Diagnosis not present

## 2014-09-30 ENCOUNTER — Ambulatory Visit (AMBULATORY_SURGERY_CENTER): Payer: Medicare Other | Admitting: Internal Medicine

## 2014-09-30 ENCOUNTER — Encounter: Payer: Self-pay | Admitting: Internal Medicine

## 2014-09-30 VITALS — BP 117/75 | HR 60 | Temp 97.8°F | Resp 20 | Ht 71.0 in | Wt 273.0 lb

## 2014-09-30 DIAGNOSIS — K209 Esophagitis, unspecified without bleeding: Secondary | ICD-10-CM

## 2014-09-30 DIAGNOSIS — K219 Gastro-esophageal reflux disease without esophagitis: Secondary | ICD-10-CM

## 2014-09-30 DIAGNOSIS — R319 Hematuria, unspecified: Secondary | ICD-10-CM | POA: Diagnosis not present

## 2014-09-30 DIAGNOSIS — N319 Neuromuscular dysfunction of bladder, unspecified: Secondary | ICD-10-CM | POA: Diagnosis not present

## 2014-09-30 DIAGNOSIS — R300 Dysuria: Secondary | ICD-10-CM | POA: Diagnosis not present

## 2014-09-30 MED ORDER — SODIUM CHLORIDE 0.9 % IV SOLN: 500.0000 mL | INTRAVENOUS | Status: DC

## 2014-09-30 NOTE — Patient Instructions (Signed)
Discharge instructions given. Normal exam. Resume previous medications. YOU HAD AN ENDOSCOPIC PROCEDURE TODAY AT THE [facility]:   Refer to the procedure report that was given to you for any specific questions about what was found during the examination.  If the procedure report does not answer your questions, please call your gastroenterologist to clarify.  If you requested that your care partner not be given the details of your procedure findings, then the procedure report has been included in a sealed envelope for you to review at your convenience later.  YOU SHOULD EXPECT: Some feelings of bloating in the abdomen. Passage of more gas than usual.  Walking can help get rid of the air that was put into your GI tract during the procedure and reduce the bloating. If you had a lower endoscopy (such as a colonoscopy or flexible sigmoidoscopy) you may notice spotting of blood in your stool or on the toilet paper. If you underwent a bowel prep for your procedure, you may not have a normal bowel movement for a few days.  Please Note:  You might notice some irritation and congestion in your nose or some drainage.  This is from the oxygen used during your procedure.  There is no need for concern and it should clear up in a day or so.  SYMPTOMS TO REPORT IMMEDIATELY:    Following upper endoscopy (EGD)  Vomiting of blood or coffee ground material  New chest pain or pain under the shoulder blades  Painful or persistently difficult swallowing  New shortness of breath  Fever of 100F or higher  Black, tarry-looking stools  For urgent or emergent issues, a gastroenterologist can be reached at any hour by calling [phone].   DIET: Your first meal following the procedure should be a small meal and then it is ok to progress to your normal diet. Heavy or fried foods are harder to digest and may make you feel nauseous or bloated.  Likewise, meals heavy in dairy and vegetables can increase  bloating.  Drink plenty of fluids but you should avoid alcoholic beverages for 24 hours.  ACTIVITY:  You should plan to take it easy for the rest of today and you should NOT DRIVE or use heavy machinery until tomorrow (because of the sedation medicines used during the test).    FOLLOW UP: Our staff will call the number listed on your records the next business day following your procedure to check on you and address any questions or concerns that you may have regarding the information given to you following your procedure. If we do not reach you, we will leave a message.  However, if you are feeling well and you are not experiencing any problems, there is no need to return our call.  We will assume that you have returned to your regular daily activities without incident.  If any biopsies were taken you will be contacted by phone or by letter within the next 1-3 weeks.  Please call us at [phone] if you have not heard about the biopsies in 3 weeks.    SIGNATURES/CONFIDENTIALITY: You and/or your care partner have signed paperwork which will be entered into your electronic medical record.  These signatures attest to the fact that that the information above on your After Visit Summary has been reviewed and is understood.  Full responsibility of the confidentiality of this discharge information lies with you and/or your care-partner. 

## 2014-09-30 NOTE — Op Note (Signed)
[facility]  [address] [place], [address]   ENDOSCOPY PROCEDURE REPORT  PATIENT: Kenneth, Rhodes  MR#: [id number] BIRTHDATE: 1945/08/01 , 69  yrs. old GENDER: male ENDOSCOPIST: Eustace Quail, MD REFERRED BY:  .  Self / Office PROCEDURE DATE:  09/30/2014 PROCEDURE:  EGD, diagnostic ASA CLASS:     Class II INDICATIONS:  history of esophageal reflux. MEDICATIONS: Monitored anesthesia care and Propofol 140 mg IV TOPICAL ANESTHETIC: none  DESCRIPTION OF PROCEDURE: After the risks benefits and alternatives of the procedure were thoroughly explained, informed consent was obtained.  The Pentax [id number] 3.8 [id number] endoscope was introduced through the mouth and advanced to the second portion of the duodenum , Without limitations.  The instrument was slowly withdrawn as the mucosa was fully examined.      EXAM: The esophagus and gastroesophageal junction were completely normal in appearance.  The stomach was entered and closely examined.The antrum, angularis, and lesser curvature were well visualized, including a retroflexed view of the cardia and fundus. The stomach wall was normally distensable.  The scope passed easily through the pylorus into the duodenum.  Retroflexed views revealed no abnormalities.     The scope was then withdrawn from the patient and the procedure completed.  COMPLICATIONS: There were no immediate complications.  ENDOSCOPIC IMPRESSION: 1. Normal EGD 2. GERD without esophagitis  RECOMMENDATIONS: 1. Continue current medications for GERD (famotidine twice daily) 2. Office follow-up in 3 months. Contact the office in the interim for any questions or problems  REPEAT EXAM:  eSigned:  Eustace Quail, MD 09/30/2014 10:46 AM    CC:The Patient and Evelina Dun, FNP

## 2014-09-30 NOTE — Progress Notes (Signed)
A/ox3 pleased with MAC, report to celia RN 

## 2014-10-01 ENCOUNTER — Telehealth: Payer: Self-pay

## 2014-10-01 NOTE — Telephone Encounter (Signed)
  Follow up Call-  Call back number 09/30/2014  Post procedure Call Back phone  # [phone]  Permission to leave phone message Yes     Patient questions:  Do you have a fever, pain , or abdominal swelling? No. Pain Score  0 *  Have you tolerated food without any problems? Yes.    Have you been able to return to your normal activities? Yes.    Do you have any questions about your discharge instructions: Diet   No. Medications  No. Follow up visit  No.  Do you have questions or concerns about your Care? No.  Actions: * If pain score is 4 or above: No action needed, pain <4.

## 2014-10-02 ENCOUNTER — Ambulatory Visit (HOSPITAL_COMMUNITY)
Admission: RE | Admit: 2014-10-02 | Discharge: 2014-10-02 | Disposition: A | Discharge status: Home / Self Care | Payer: Medicare Other | Source: Ambulatory Visit | Attending: Nurse Practitioner | Admitting: Nurse Practitioner

## 2014-10-02 DIAGNOSIS — I77811 Abdominal aortic ectasia: Secondary | ICD-10-CM | POA: Insufficient documentation

## 2014-10-02 DIAGNOSIS — B182 Chronic viral hepatitis C: Secondary | ICD-10-CM | POA: Diagnosis not present

## 2014-10-02 DIAGNOSIS — N281 Cyst of kidney, acquired: Secondary | ICD-10-CM | POA: Diagnosis not present

## 2014-10-02 DIAGNOSIS — B1920 Unspecified viral hepatitis C without hepatic coma: Secondary | ICD-10-CM | POA: Diagnosis present

## 2014-10-05 DIAGNOSIS — E039 Hypothyroidism, unspecified: Secondary | ICD-10-CM | POA: Diagnosis not present

## 2014-10-05 DIAGNOSIS — R339 Retention of urine, unspecified: Secondary | ICD-10-CM | POA: Diagnosis not present

## 2014-10-05 DIAGNOSIS — I10 Essential (primary) hypertension: Secondary | ICD-10-CM | POA: Diagnosis not present

## 2014-10-05 DIAGNOSIS — F329 Major depressive disorder, single episode, unspecified: Secondary | ICD-10-CM | POA: Diagnosis not present

## 2014-10-05 DIAGNOSIS — F410 Panic disorder [episodic paroxysmal anxiety] without agoraphobia: Secondary | ICD-10-CM | POA: Diagnosis not present

## 2014-10-05 DIAGNOSIS — Z8614 Personal history of Methicillin resistant Staphylococcus aureus infection: Secondary | ICD-10-CM | POA: Diagnosis not present

## 2014-10-05 DIAGNOSIS — F419 Anxiety disorder, unspecified: Secondary | ICD-10-CM | POA: Diagnosis not present

## 2014-10-05 DIAGNOSIS — R197 Diarrhea, unspecified: Secondary | ICD-10-CM | POA: Diagnosis not present

## 2014-10-06 ENCOUNTER — Telehealth: Payer: Self-pay | Admitting: Internal Medicine

## 2014-10-06 ENCOUNTER — Encounter (HOSPITAL_COMMUNITY): Payer: Self-pay | Admitting: *Deleted

## 2014-10-06 ENCOUNTER — Emergency Department (HOSPITAL_COMMUNITY)
Admission: EM | Admit: 2014-10-06 | Discharge: 2014-10-06 | Disposition: A | Discharge status: Home / Self Care | Payer: Medicare Other | Attending: Emergency Medicine | Admitting: Emergency Medicine

## 2014-10-06 ENCOUNTER — Emergency Department (HOSPITAL_COMMUNITY): Payer: Medicare Other

## 2014-10-06 DIAGNOSIS — K219 Gastro-esophageal reflux disease without esophagitis: Secondary | ICD-10-CM | POA: Insufficient documentation

## 2014-10-06 DIAGNOSIS — N400 Enlarged prostate without lower urinary tract symptoms: Secondary | ICD-10-CM | POA: Diagnosis not present

## 2014-10-06 DIAGNOSIS — Z792 Long term (current) use of antibiotics: Secondary | ICD-10-CM | POA: Insufficient documentation

## 2014-10-06 DIAGNOSIS — R110 Nausea: Secondary | ICD-10-CM | POA: Diagnosis not present

## 2014-10-06 DIAGNOSIS — E669 Obesity, unspecified: Secondary | ICD-10-CM | POA: Diagnosis not present

## 2014-10-06 DIAGNOSIS — R1032 Left lower quadrant pain: Secondary | ICD-10-CM | POA: Diagnosis not present

## 2014-10-06 DIAGNOSIS — F329 Major depressive disorder, single episode, unspecified: Secondary | ICD-10-CM | POA: Insufficient documentation

## 2014-10-06 DIAGNOSIS — R109 Unspecified abdominal pain: Secondary | ICD-10-CM | POA: Insufficient documentation

## 2014-10-06 DIAGNOSIS — R197 Diarrhea, unspecified: Secondary | ICD-10-CM | POA: Diagnosis not present

## 2014-10-06 DIAGNOSIS — E039 Hypothyroidism, unspecified: Secondary | ICD-10-CM | POA: Diagnosis not present

## 2014-10-06 DIAGNOSIS — R1012 Left upper quadrant pain: Secondary | ICD-10-CM | POA: Diagnosis not present

## 2014-10-06 DIAGNOSIS — T3696XA Underdosing of unspecified systemic antibiotic, initial encounter: Secondary | ICD-10-CM | POA: Diagnosis not present

## 2014-10-06 DIAGNOSIS — Z8619 Personal history of other infectious and parasitic diseases: Secondary | ICD-10-CM | POA: Insufficient documentation

## 2014-10-06 DIAGNOSIS — Z8614 Personal history of Methicillin resistant Staphylococcus aureus infection: Secondary | ICD-10-CM | POA: Insufficient documentation

## 2014-10-06 DIAGNOSIS — IMO0001 Reserved for inherently not codable concepts without codable children: Secondary | ICD-10-CM

## 2014-10-06 DIAGNOSIS — I10 Essential (primary) hypertension: Secondary | ICD-10-CM | POA: Diagnosis not present

## 2014-10-06 DIAGNOSIS — M1990 Unspecified osteoarthritis, unspecified site: Secondary | ICD-10-CM | POA: Insufficient documentation

## 2014-10-06 DIAGNOSIS — Z79899 Other long term (current) drug therapy: Secondary | ICD-10-CM | POA: Diagnosis not present

## 2014-10-06 DIAGNOSIS — K521 Toxic gastroenteritis and colitis: Secondary | ICD-10-CM | POA: Diagnosis not present

## 2014-10-06 DIAGNOSIS — Z87891 Personal history of nicotine dependence: Secondary | ICD-10-CM | POA: Insufficient documentation

## 2014-10-06 LAB — CBC WITH DIFFERENTIAL/PLATELET
Basophils Absolute: 0.1 10*3/uL (ref 0.0–0.1)
Basophils Relative: 1 % (ref 0–1)
EOS ABS: 0.1 10*3/uL (ref 0.0–0.7)
EOS PCT: 3 % (ref 0–5)
HCT: 38.3 % — ABNORMAL LOW (ref 39.0–52.0)
Hemoglobin: 12.9 g/dL — ABNORMAL LOW (ref 13.0–17.0)
LYMPHS ABS: 1.1 10*3/uL (ref 0.7–4.0)
Lymphocytes Relative: 26 % (ref 12–46)
MCH: 28.1 pg (ref 26.0–34.0)
MCHC: 33.7 g/dL (ref 30.0–36.0)
MCV: 83.4 fL (ref 78.0–100.0)
MONOS PCT: 11 % (ref 3–12)
Monocytes Absolute: 0.5 10*3/uL (ref 0.1–1.0)
NEUTROS ABS: 2.5 10*3/uL (ref 1.7–7.7)
Neutrophils Relative %: 59 % (ref 43–77)
Platelets: 198 10*3/uL (ref 150–400)
RBC: 4.59 MIL/uL (ref 4.22–5.81)
RDW: 14 % (ref 11.5–15.5)
WBC: 4.2 10*3/uL (ref 4.0–10.5)

## 2014-10-06 LAB — URINALYSIS, ROUTINE W REFLEX MICROSCOPIC
BILIRUBIN URINE: NEGATIVE
Glucose, UA: NEGATIVE mg/dL
Hgb urine dipstick: NEGATIVE
KETONES UR: NEGATIVE mg/dL
Leukocytes, UA: NEGATIVE
Nitrite: NEGATIVE
PROTEIN: NEGATIVE mg/dL
Specific Gravity, Urine: 1.01 (ref 1.005–1.030)
Urobilinogen, UA: 0.2 mg/dL (ref 0.0–1.0)
pH: 6 (ref 5.0–8.0)

## 2014-10-06 LAB — CLOSTRIDIUM DIFFICILE BY PCR: CDIFFPCR: NEGATIVE

## 2014-10-06 LAB — BASIC METABOLIC PANEL
Anion gap: 10 (ref 5–15)
BUN: 11 mg/dL (ref 6–20)
CHLORIDE: 101 mmol/L (ref 101–111)
CO2: 27 mmol/L (ref 22–32)
CREATININE: 1.28 mg/dL — AB (ref 0.61–1.24)
Calcium: 9 mg/dL (ref 8.9–10.3)
GFR calc Af Amer: >60 mL/min (ref >60)
GFR calc non Af Amer: 56 mL/min — ABNORMAL LOW (ref >60)
Glucose, Bld: 112 mg/dL — ABNORMAL HIGH (ref 65–99)
Potassium: 3.6 mmol/L (ref 3.5–5.1)
Sodium: 138 mmol/L (ref 135–145)

## 2014-10-06 MED ORDER — SODIUM CHLORIDE 0.9 % IV SOLN: INTRAVENOUS | Status: DC

## 2014-10-06 MED ORDER — SODIUM CHLORIDE 0.9 % IV BOLUS (SEPSIS): 250.0000 mL | Freq: Once | INTRAVENOUS | Status: DC

## 2014-10-06 MED ORDER — SODIUM CHLORIDE 0.9 % IV SOLN
INTRAVENOUS | Status: DC
  Administered 2014-10-06: 09:00:00 via INTRAVENOUS

## 2014-10-06 MED ORDER — VANCOMYCIN 50 MG/ML ORAL SOLUTION: 500.0000 mg | Freq: Four times a day (QID) | ORAL | Status: DC

## 2014-10-06 MED ORDER — ONDANSETRON HCL 4 MG/2ML IJ SOLN
4.0000 mg | Freq: Once | INTRAMUSCULAR | Status: AC
  Administered 2014-10-06: 4 mg via INTRAVENOUS
  Filled 2014-10-06: qty 2

## 2014-10-06 MED ORDER — FENTANYL CITRATE (PF) 100 MCG/2ML IJ SOLN
50.0000 ug | Freq: Once | INTRAMUSCULAR | Status: AC
  Administered 2014-10-06: 50 ug via INTRAVENOUS
  Filled 2014-10-06: qty 2

## 2014-10-06 MED ORDER — SODIUM CHLORIDE 0.9 % IV BOLUS (SEPSIS)
500.0000 mL | Freq: Once | INTRAVENOUS | Status: AC
Start: 2014-10-06 — End: 2014-10-06
  Administered 2014-10-06: 500 mL via INTRAVENOUS

## 2014-10-06 MED ORDER — IOHEXOL 300 MG/ML  SOLN: 50.0000 mL | Freq: Once | INTRAMUSCULAR | Status: DC | PRN

## 2014-10-06 MED ORDER — IOHEXOL 300 MG/ML  SOLN
100.0000 mL | Freq: Once | INTRAMUSCULAR | Status: AC | PRN
  Administered 2014-10-06: 100 mL via INTRAVENOUS

## 2014-10-06 NOTE — ED Notes (Signed)
MD at bedside. 

## 2014-10-06 NOTE — ED Notes (Addendum)
Patient c/o abd pain started yesterday with diarrhea. Patient is being treated for C. Diff, has had three rounds of antibiotics and was feeling better until yesterday. Patient was seen at [facility] yesterday, was unable to void, has a catheter in place currently.

## 2014-10-06 NOTE — ED Provider Notes (Signed)
CSN: [id number]     Arrival date & time 10/06/14  0865 History  This chart was scribed for Fredia Sorrow, MD by Terressa Koyanagi, ED Scribe. This patient was seen in room APA08/APA08 and the patient's care was started at 8:30 AM.   Chief Complaint  Patient presents with  . Abdominal Pain   Patient is a 69 y.o. male presenting with abdominal pain. The history is provided by the patient. No language interpreter was used.  Abdominal Pain Pain location:  LUQ and LLQ Pain quality: cramping   Pain severity:  Severe Duration:  1 day Timing:  Constant Progression:  Unchanged Chronicity:  Recurrent Context: not retching and not trauma   Associated symptoms: diarrhea and nausea   Associated symptoms: no chest pain, no chills, no cough, no dysuria, no fever, no shortness of breath, no sore throat and no vomiting    PCP: Sharion Balloon, FNP HPI Comments: FINNIGAN WARRINER is a 69 y.o. male, with PMHx noted below, who presents to the Emergency Department complaining of atraumatic, recurrent, sudden onset, 7/10, cramping lower abd pain with associated diarrhea onset yesterday. Pt specifies three episodes of diarrhea yesterday and none  today. Pt notes he was seen for similar Sx by his PCP three weeks ago. Pt further notes that he is being treated for Clostridium difficile colitis with vancomycin (liquid form), pt completed three rounds of vancomycin with his last dose taken on 09/30/14.  Pt also reports taking daily probiotics. Pt further reports Dr. Dominica Severin, Gastroenterology Specialist, completed an upper endoscopy last week which was normal.  Pt denies blood in diarrhea, vomiting (but notes some nausea), fever/chills, visual changes, cold Sx (cough, runny nose, sore throat), chest pain, SOB, swelling of BLE, rash, bleeding easily, new back pain (chronic back pain at baseline), numbness or headache. Pt reports an allergy to hydrocodone and denies any other allergies.   Past Medical History  Diagnosis Date   . Hyperlipidemia   . MRSA (methicillin resistant staph aureus) culture positive   . Hypertension   . Hypothyroidism   . Depression   . Liver disease   . Chronic hepatitis C   . Sleep apnea   . Obesity   . GERD (gastroesophageal reflux disease)   . BPH (benign prostatic hypertrophy)   . C. difficile diarrhea   . Arthritis   . Blood transfusion without reported diagnosis   . Substance abuse     pain medication   Past Surgical History  Procedure Laterality Date  . Spine surgery    . Joint replacement Right     knee  . Lumbar epidural injection    . Chronic hep c    . C diff     Family History  Problem Relation Age of Onset  . Ovarian cancer Mother   . AAA (abdominal aortic aneurysm) Father    History  Substance Use Topics  . Smoking status: Former Smoker    Quit date: 03/21/1994  . Smokeless tobacco: Never Used  . Alcohol Use: No    Review of Systems  Constitutional: Negative for fever and chills.  HENT: Negative for congestion, rhinorrhea and sore throat.   Eyes: Negative for visual disturbance.  Respiratory: Negative for cough and shortness of breath.   Cardiovascular: Negative for chest pain and leg swelling.  Gastrointestinal: Positive for nausea, abdominal pain and diarrhea. Negative for vomiting and blood in stool.  Genitourinary: Negative for dysuria.  Musculoskeletal: Positive for back pain (at baseline).  Skin: Negative for rash.  Neurological: Negative for numbness and headaches.  Hematological: Does not bruise/bleed easily.  Psychiatric/Behavioral: Negative for confusion.      Allergies  Hydrocodone  Home Medications   Prior to Admission medications   Medication Sig Start Date End Date Taking? Authorizing Provider  bismuth subsalicylate (PEPTO BISMOL) 262 MG/15ML suspension Take 30 mLs by mouth every 6 (six) hours as needed for diarrhea or loose stools.   Yes Historical Provider, MD  cimetidine (TAGAMET) 200 MG tablet Take 200 mg by mouth 3  (three) times daily as needed (stomach pain).   Yes Historical Provider, MD  citalopram (CELEXA) 40 MG tablet take 1 tablets by mouth once daily 09/29/14  Yes Sharion Balloon, FNP  dicyclomine (BENTYL) 10 MG capsule Take 1 capsule (10 mg total) by mouth 3 (three) times daily before meals. 08/22/14  Yes Amy S Esterwood, PA-C  famotidine (PEPCID) 40 MG tablet Take 1 tablet (40 mg total) by mouth 2 (two) times daily. 08/13/14  Yes Amy S Esterwood, PA-C  Lactobacillus (RA ACIDOPHILUS) 300 MG CAPS Take 1 capsule by mouth 2 (two) times daily.   Yes Historical Provider, MD  levothyroxine (SYNTHROID, LEVOTHROID) 125 MCG tablet TAKE 1 TABLET BY MOUTH EVERY DAY NOTE DOSAGE CHANGE 07/22/14  Yes Chipper Herb, MD  metroNIDAZOLE (FLAGYL) 500 MG tablet Take 500 mg by mouth 3 (three) times daily. 10/05/14  Yes Historical Provider, MD  omeprazole (PRILOSEC) 20 MG capsule Take 20 mg by mouth daily. 08/11/14  Yes Historical Provider, MD  SUBOXONE 12-3 MG FILM Take 1 Film by mouth daily.  04/24/14  Yes Historical Provider, MD  tamsulosin (FLOMAX) 0.4 MG CAPS capsule Take 0.4 mg by mouth 2 (two) times daily.  01/31/14  Yes Historical Provider, MD  Vitamin D, Ergocalciferol, (DRISDOL) 50000 UNITS CAPS capsule Take 1 capsule (50,000 Units total) by mouth every 7 (seven) days. 06/09/14  Yes Sharion Balloon, FNP  vancomycin (VANCOCIN) 50 mg/mL oral solution Take 10 mLs (500 mg total) by mouth 4 (four) times daily. 09/12/14   Irene Shipper, MD  vancomycin (VANCOCIN) 50 mg/mL oral solution Take 10 mLs (500 mg total) by mouth every 6 (six) hours. 10/06/14   Fredia Sorrow, MD   Triage Vitals: BP 121/82 mmHg  Pulse 66  Temp(Src) 99 F (37.2 C) (Oral)  Resp 16  Ht 6' (1.829 m)  Wt 270 lb (122.471 kg)  BMI 36.61 kg/m2  SpO2 93% Physical Exam  Constitutional: He is oriented to person, place, and time. He appears well-developed and well-nourished.  HENT:  Head: Normocephalic and atraumatic.  Mouth/Throat: Oropharynx is clear and  moist and mucous membranes are normal.  Eyes: EOM are normal. Pupils are equal, round, and reactive to light. No scleral icterus.  Neck: Normal range of motion.  Cardiovascular: Normal rate, regular rhythm, normal heart sounds and intact distal pulses.   Pulmonary/Chest: Effort normal and breath sounds normal. No respiratory distress.  Lungs clear bilaterally   Abdominal: Soft. Bowel sounds are normal. He exhibits no distension. There is no tenderness.  Musculoskeletal: Normal range of motion.  Neurological: He is alert and oriented to person, place, and time. No cranial nerve deficit. He exhibits normal muscle tone. Coordination normal.  Pt able to move both sets of fingers and toes   Skin: Skin is warm and dry.  Psychiatric: He has a normal mood and affect. Judgment normal.  Nursing note and vitals reviewed.   ED Course  Procedures (including critical care time) DIAGNOSTIC STUDIES: Oxygen Saturation is  93% on RA, adequate by my interpretation.    COORDINATION OF CARE: 8:39 AM-Discussed treatment plan which includes labs and imaging with pt at bedside and pt agreed to plan.   Results for orders placed or performed during the hospital encounter of 16/60/63  Basic metabolic panel  Result Value Ref Range   Sodium 138 135 - 145 mmol/L   Potassium 3.6 3.5 - 5.1 mmol/L   Chloride 101 101 - 111 mmol/L   CO2 27 22 - 32 mmol/L   Glucose, Bld 112 (H) 65 - 99 mg/dL   BUN 11 6 - 20 mg/dL   Creatinine, Ser 1.28 (H) 0.61 - 1.24 mg/dL   Calcium 9.0 8.9 - 10.3 mg/dL   GFR calc non Af Amer 56 (L) >60 mL/min   GFR calc Af Amer >60 >60 mL/min   Anion gap 10 5 - 15  CBC with Differential  Result Value Ref Range   WBC 4.2 4.0 - 10.5 K/uL   RBC 4.59 4.22 - 5.81 MIL/uL   Hemoglobin 12.9 (L) 13.0 - 17.0 g/dL   HCT 38.3 (L) 39.0 - 52.0 %   MCV 83.4 78.0 - 100.0 fL   MCH 28.1 26.0 - 34.0 pg   MCHC 33.7 30.0 - 36.0 g/dL   RDW 14.0 11.5 - 15.5 %   Platelets 198 150 - 400 K/uL   Neutrophils  Relative % 59 43 - 77 %   Neutro Abs 2.5 1.7 - 7.7 K/uL   Lymphocytes Relative 26 12 - 46 %   Lymphs Abs 1.1 0.7 - 4.0 K/uL   Monocytes Relative 11 3 - 12 %   Monocytes Absolute 0.5 0.1 - 1.0 K/uL   Eosinophils Relative 3 0 - 5 %   Eosinophils Absolute 0.1 0.0 - 0.7 K/uL   Basophils Relative 1 0 - 1 %   Basophils Absolute 0.1 0.0 - 0.1 K/uL  Urinalysis, Routine w reflex microscopic (not at [facility])  Result Value Ref Range   Color, Urine YELLOW YELLOW   APPearance CLEAR CLEAR   Specific Gravity, Urine 1.010 1.005 - 1.030   pH 6.0 5.0 - 8.0   Glucose, UA NEGATIVE NEGATIVE mg/dL   Hgb urine dipstick NEGATIVE NEGATIVE   Bilirubin Urine NEGATIVE NEGATIVE   Ketones, ur NEGATIVE NEGATIVE mg/dL   Protein, ur NEGATIVE NEGATIVE mg/dL   Urobilinogen, UA 0.2 0.0 - 1.0 mg/dL   Nitrite NEGATIVE NEGATIVE   Leukocytes, UA NEGATIVE NEGATIVE   Ct Abdomen Pelvis W Contrast  10/06/2014   CLINICAL DATA:  Abdominal pain, diarrhea since 10/05/2014. Being treated for C difficile colitis with 3 rounds of antibiotics.  EXAM: CT ABDOMEN AND PELVIS WITH CONTRAST  TECHNIQUE: Multidetector CT imaging of the abdomen and pelvis was performed using the standard protocol following bolus administration of intravenous contrast.  CONTRAST:  134mL OMNIPAQUE IOHEXOL 300 MG/ML  SOLN  COMPARISON:  04/07/2014  FINDINGS: Minimal dependent atelectasis in the lung bases. No effusions. Heart is normal size.  Liver, gallbladder, spleen, pancreas, adrenals and left kidney are unremarkable. Benign-appearing cyst measuring 4.5 cm in the midpole of the right kidney, stable since prior study. No hydronephrosis.  Foley catheter present within the bladder. Appendix is visualized and is normal. Stomach, large and small bowel unremarkable. No free fluid, free air or adenopathy. Aorta is normal caliber.  No acute bony abnormality or focal bone lesion. Postoperative changes from posterior fusion in the lower lumbar spine. Stable chronic kyphotic  deformity at L1-2.  IMPRESSION: No acute findings in  the abdomen or pelvis.   Electronically Signed   By: Rolm Baptise M.D.   On: 10/06/2014 11:00   Dg Abd 2 Views  09/10/2014   CLINICAL DATA:  Diarrhea 2 weeks ago, recent C difficile infection with antibiotic treatment, history hypertension, chronic hepatitis-C and liver disease, hyperlipidemia, GERD  EXAM: ABDOMEN - 2 VIEW  COMPARISON:  06/26/2014  FINDINGS: Normal bowel gas pattern.  No bowel dilatation or bowel wall thickening.  Prior lumbar fusion L2-S1 with prior laminectomies of L3, L4 and L5.  Multilevel degenerative disc disease changes of the lower thoracic and upper lumbar spine.  No definite urinary tract calcification.  IMPRESSION: Normal bowel gas pattern.   Electronically Signed   By: Lavonia Dana M.D.   On: 09/10/2014 14:00   US Abdomen Complete W/elastography  10/02/2014   CLINICAL DATA:  Hepatitis-C virus.  EXAM: ULTRASOUND ABDOMEN COMPLETE  ULTRASOUND HEPATIC ELASTOGRAPHY  TECHNIQUE: Sonography of the upper abdomen was performed. In addition, ultrasound elastography evaluation of the liver was performed. A region of interest was placed within the right lobe of the liver. Following application of a compressive sonographic pulse, shear waves were detected in the adjacent hepatic tissue and the shear wave velocity was calculated. Multiple assessments were performed at the selected site. Median shear wave velocity is correlated to a Metavir fibrosis score.  COMPARISON:  None.  FINDINGS: ULTRASOUND ABDOMEN  Gallbladder: No gallstones or wall thickening visualized. No sonographic Murphy sign noted.  Common bile duct: Diameter: 5.6 mm.  Liver: No focal lesion identified. Within normal limits in parenchymal echogenicity.  IVC: No abnormality visualized.  Pancreas: Visualized portion unremarkable.  Spleen: Size and appearance within normal limits.  Right Kidney: Length: 12.6 cm. Simple appearing cyst is identified arising from the upper pole 4.8 x 4.6  x 3.4 cm. Echogenicity within normal limits. No mass or hydronephrosis visualized.  Left Kidney: Length: 12.9 cm. Echogenicity within normal limits. No mass or hydronephrosis visualized.  Abdominal aorta: Measures 2.9 cm.  Other findings: None.  ULTRASOUND HEPATIC ELASTOGRAPHY  Device: Siemens Helix VTQ  Transducer 6 C1 HD  Patient position: Left lateral decubitus  Number of measurements:  10  Hepatic Segment:  8  Median velocity:   3.57  m/sec  IQR: 0.30  IQR/Median velocity ratio 0.08  Corresponding Metavir fibrosis score:  Some F 3 and F 4  Risk of fibrosis: High  Limitations of exam: None  Pertinent findings noted on other imaging exams:  None  Please note that abnormal shear wave velocities may also be identified in clinical settings other than with hepatic fibrosis, such as: acute hepatitis, elevated right heart and central venous pressures including use of beta blockers, veno-occlusive disease (Budd-Chiari), infiltrative processes such as mastocytosis/amyloidosis/infiltrative tumor, extrahepatic cholestasis, in the post-prandial state, and liver transplantation. Correlation with patient history, laboratory data, and clinical condition recommended.  IMPRESSION: 1. Unremarkable appearance of the liver. 2. Ectatic abdominal aorta at risk for aneurysm development. Recommend followup by ultrasound in 5 years. This recommendation follows ACR consensus guidelines: White Paper of the ACR Incidental Findings Committee II on Vascular Findings. J Am Coll Radiol 2013; 10:789-794. 3. Kidney cyst  Median hepatic shear wave velocity is calculated at 3.57 m/sec.  Corresponding Metavir fibrosis score is some F 3 and F 4.  Risk of fibrosis is high.  Follow-up:  Followup is advised.   Electronically Signed   By: Kerby Moors M.D.   On: 10/02/2014 11:40    Medications  0.9 %  sodium chloride  infusion ( Intravenous New Bag/Given 10/06/14 0901)  iohexol (OMNIPAQUE) 300 MG/ML solution 50 mL (not administered)  ondansetron  (ZOFRAN) injection 4 mg (4 mg Intravenous Given 10/06/14 0901)  fentaNYL (SUBLIMAZE) injection 50 mcg (50 mcg Intravenous Given 10/06/14 0900)  sodium chloride 0.9 % bolus 500 mL (0 mLs Intravenous Stopped 10/06/14 1003)  iohexol (OMNIPAQUE) 300 MG/ML solution 100 mL (100 mLs Intravenous Contrast Given 10/06/14 1043)  fentaNYL (SUBLIMAZE) injection 50 mcg (50 mcg Intravenous Given 10/06/14 1242)     MDM   Final diagnoses:  Abdominal pain  History of Clostridium difficile    Workup for the abdominal pain without any significant findings. CT scan of the abdomen negative labs without significant N O'Malley's. Patient symptoms most likely are consistent with recurrent C. difficile. Discussed with his GI doctor Dr. Earnie Larsson who recommends 4 week history of the oral vancomycin.  I personally performed the services described in this documentation, which was scribed in my presence. The recorded information has been reviewed and is accurate.     Fredia Sorrow, MD 10/06/14 1326

## 2014-10-06 NOTE — Discharge Instructions (Signed)
Take the vancomycin 10 mL 4 times a day for the next 4 weeks. [facility] follow up with your GI doctor. Return for any new or worse symptoms.

## 2014-10-06 NOTE — Telephone Encounter (Signed)
Pt states he is in the ER at [facility] right now. States he is having diarrhea and abdominal pain.

## 2014-10-07 ENCOUNTER — Telehealth: Payer: Self-pay | Admitting: Internal Medicine

## 2014-10-07 MED ORDER — HYOSCYAMINE SULFATE ER 0.375 MG PO TB12: 0.3750 mg | ORAL_TABLET | Freq: Two times a day (BID) | ORAL | Status: DC

## 2014-10-07 NOTE — Telephone Encounter (Signed)
Not necessarily. However, Have him call with update in 2 weeks

## 2014-10-07 NOTE — Telephone Encounter (Signed)
Script sent to the pharmacy and pt aware. Per EGD report pt was to follow-up in October but pt wants to know if he should be seen sooner.  Please advise.

## 2014-10-07 NOTE — Telephone Encounter (Signed)
Pt was seen at [facility] ER yesterday, Dr. Henrene Pastor was consulted and pt was started on Vancomycin again for recurrent Cdiff. Pt calling this am stating that he is having a lot of abdominal cramping right along his belt line. States the bentyl he has is not helping and is requesting something be called in for the cramping and abdominal discomfort. Please advise.

## 2014-10-07 NOTE — Telephone Encounter (Signed)
Try Levbid 0.375 mg twice a day as needed

## 2014-10-07 NOTE — Telephone Encounter (Signed)
Spoke with pt and he is aware. Will schedule pt an appt in Oct when schedule out.

## 2014-10-17 ENCOUNTER — Ambulatory Visit (INDEPENDENT_AMBULATORY_CARE_PROVIDER_SITE_OTHER): Payer: Medicare Other | Admitting: Family

## 2014-10-17 ENCOUNTER — Encounter: Payer: Self-pay | Admitting: Family

## 2014-10-17 VITALS — BP 122/83 | HR 68 | Temp 97.7°F | Ht 72.0 in | Wt 264.2 lb

## 2014-10-17 DIAGNOSIS — M5416 Radiculopathy, lumbar region: Secondary | ICD-10-CM | POA: Diagnosis not present

## 2014-10-17 DIAGNOSIS — N400 Enlarged prostate without lower urinary tract symptoms: Secondary | ICD-10-CM

## 2014-10-17 DIAGNOSIS — F1124 Opioid dependence with opioid-induced mood disorder: Secondary | ICD-10-CM | POA: Diagnosis not present

## 2014-10-17 DIAGNOSIS — F331 Major depressive disorder, recurrent, moderate: Secondary | ICD-10-CM | POA: Diagnosis not present

## 2014-10-17 DIAGNOSIS — G8929 Other chronic pain: Secondary | ICD-10-CM

## 2014-10-17 DIAGNOSIS — F1129 Opioid dependence with unspecified opioid-induced disorder: Secondary | ICD-10-CM

## 2014-10-17 DIAGNOSIS — F411 Generalized anxiety disorder: Secondary | ICD-10-CM

## 2014-10-17 DIAGNOSIS — Z23 Encounter for immunization: Secondary | ICD-10-CM

## 2014-10-17 DIAGNOSIS — E039 Hypothyroidism, unspecified: Secondary | ICD-10-CM | POA: Diagnosis not present

## 2014-10-17 DIAGNOSIS — E559 Vitamin D deficiency, unspecified: Secondary | ICD-10-CM | POA: Diagnosis not present

## 2014-10-17 DIAGNOSIS — F32A Depression, unspecified: Secondary | ICD-10-CM

## 2014-10-17 DIAGNOSIS — F329 Major depressive disorder, single episode, unspecified: Secondary | ICD-10-CM

## 2014-10-17 DIAGNOSIS — B1920 Unspecified viral hepatitis C without hepatic coma: Secondary | ICD-10-CM

## 2014-10-17 NOTE — Progress Notes (Signed)
Subjective:    Patient ID: Kenneth Rhodes, male    DOB: [birth year], 69 y.o.   MRN: [id number]  Pt presents to the office today for chronic follow up. Pt states he has been diagnosed with C Diff and is on his "third round" of antibiotics. Pt states he has been diagnosed with Hep C. Pt states he has a catheter and see's a Urologists every 2 weeks.  Anxiety Presents for follow-up visit. Symptoms include depressed mood and nervous/anxious behavior. Patient reports no insomnia, irritability, palpitations or restlessness. Symptoms occur rarely.   His past medical history is significant for anxiety/panic attacks and depression. Past treatments include SSRIs. The treatment provided significant relief.  Thyroid Problem Presents for follow-up visit. Symptoms include anxiety, depressed mood, diarrhea and fatigue. Patient reports no constipation, dry skin, hoarse voice, leg swelling or palpitations. The symptoms have been stable. Past treatments include levothyroxine. The treatment provided moderate relief. There is no history of heart failure.  Gastrophageal Reflux He reports no globus sensation, no hoarse voice, no sore throat or no wheezing. This is a chronic problem. The current episode started more than 1 year ago. The problem occurs rarely. The problem has been unchanged. The symptoms are aggravated by lying down and certain foods. Associated symptoms include fatigue. He has tried a PPI for the symptoms. The treatment provided moderate relief.  Hypertension This is a chronic problem. The current episode started more than 1 year ago. The problem has been resolved since onset. The problem is controlled. Associated symptoms include anxiety and malaise/fatigue. Pertinent negatives include no palpitations, peripheral edema or sweats. Risk factors for coronary artery disease include male gender and obesity. Past treatments include calcium channel blockers. The current treatment provides moderate  improvement. Hypertensive end-organ damage includes a thyroid problem. There is no history of kidney disease, CAD/MI, CVA or heart failure. There is no history of sleep apnea.  Benign Prostatic Hypertrophy This is a chronic problem. The current episode started more than 1 year ago. The problem is unchanged. Pt to have surgery 04/09/14 on prostate- for BPH- Pt on catheter.      Review of Systems  Constitutional: Positive for malaise/fatigue and fatigue. Negative for irritability.  HENT: Negative.  Negative for hoarse voice and sore throat.   Respiratory: Negative.  Negative for wheezing.   Cardiovascular: Negative.  Negative for palpitations.  Gastrointestinal: Positive for diarrhea. Negative for constipation.  Endocrine: Negative.   Musculoskeletal: Negative.   Neurological: Negative.   Hematological: Negative.   Psychiatric/Behavioral: The patient is nervous/anxious. The patient does not have insomnia.   All other systems reviewed and are negative.      Objective:   Physical Exam  Constitutional: He is oriented to person, place, and time. He appears well-developed and well-nourished. No distress.  HENT:  Head: Normocephalic.  Right Ear: External ear normal.  Left Ear: External ear normal.  Nose: Nose normal.  Mouth/Throat: Oropharynx is clear and moist.  Eyes: Pupils are equal, round, and reactive to light. Right eye exhibits no discharge. Left eye exhibits no discharge.  Neck: Normal range of motion. Neck supple. No thyromegaly present.  Cardiovascular: Normal rate, regular rhythm, normal heart sounds and intact distal pulses.   No murmur heard. Pulmonary/Chest: Effort normal and breath sounds normal. No respiratory distress. He has no wheezes.  Abdominal: Soft. Bowel sounds are normal. He exhibits no distension. There is no tenderness.  Musculoskeletal: Normal range of motion. He exhibits no edema or tenderness.  Neurological: He is alert and  oriented to person, place, and  time. He has normal reflexes. No cranial nerve deficit.  Skin: Skin is warm and dry. No rash noted. No erythema.  Psychiatric: He has a normal mood and affect. His behavior is normal. Judgment and thought content normal.  Vitals reviewed.   BP 122/83 mmHg  Pulse 68  Temp(Src) 97.7 F (36.5 C) (Oral)  Ht 6' (1.829 m)  Wt 264 lb 3.2 oz (119.84 kg)  BMI 35.82 kg/m2       Assessment & Plan:  1. GAD (generalized anxiety disorder) - CMP14+EGFR - Ambulatory referral to Psychology  2. Hypothyroidism, unspecified hypothyroidism type - CMP14+EGFR - Thyroid Panel With TSH  3. Depression - CMP14+EGFR - Ambulatory referral to Psychology  4. BPH (benign prostatic hyperplasia) - CMP14+EGFR  5. Major depressive disorder, recurrent episode, moderate - CMP14+EGFR - Ambulatory referral to Psychology  6. Vitamin D deficiency - CMP14+EGFR  7. Chronic pain - CMP14+EGFR  8. Lumbar radiculopathy, chronic - CMP14+EGFR  9. Opioid dependence with opioid-induced mood disorder  - CMP14+EGFR  10. Opioid dependence with opioid-induced disorder - CMP14+EGFR  11. Hepatitis C virus infection without hepatic coma, unspecified chronicity -Keep follow up appts   Continue all meds Labs pending Health Maintenance reviewed Diet and exercise encouraged RTO 6 months  Evelina Dun, FNP

## 2014-10-17 NOTE — Patient Instructions (Signed)

## 2014-10-17 NOTE — Addendum Note (Signed)
Addended by: Shelbie Ammons on: 10/17/2014 11:37 AM   Modules accepted: Orders

## 2014-10-18 LAB — CMP14+EGFR
ALBUMIN: 4.3 g/dL (ref 3.6–4.8)
ALK PHOS: 102 IU/L (ref 39–117)
ALT: 119 IU/L — AB (ref 0–44)
AST: 87 IU/L — AB (ref 0–40)
Albumin/Globulin Ratio: 1.8 (ref 1.1–2.5)
BILIRUBIN TOTAL: 0.4 mg/dL (ref 0.0–1.2)
BUN / CREAT RATIO: 13 (ref 10–22)
BUN: 12 mg/dL (ref 8–27)
CHLORIDE: 100 mmol/L (ref 97–108)
CO2: 26 mmol/L (ref 18–29)
Calcium: 9.4 mg/dL (ref 8.6–10.2)
Creatinine, Ser: 0.94 mg/dL (ref 0.76–1.27)
GFR calc non Af Amer: 84 mL/min/{1.73_m2} (ref >59)
GFR, EST AFRICAN AMERICAN: 97 mL/min/{1.73_m2} (ref >59)
Globulin, Total: 2.4 g/dL (ref 1.5–4.5)
Glucose: 98 mg/dL (ref 65–99)
POTASSIUM: 4.8 mmol/L (ref 3.5–5.2)
Sodium: 140 mmol/L (ref 134–144)
Total Protein: 6.7 g/dL (ref 6.0–8.5)

## 2014-10-18 LAB — THYROID PANEL WITH TSH
Free Thyroxine Index: 2.1 (ref 1.2–4.9)
T3 Uptake Ratio: 23 % — ABNORMAL LOW (ref 24–39)
T4, Total: 9 ug/dL (ref 4.5–12.0)
TSH: 2.69 u[IU]/mL (ref 0.450–4.500)

## 2014-10-20 ENCOUNTER — Encounter (HOSPITAL_COMMUNITY): Payer: Self-pay | Admitting: Emergency Medicine

## 2014-10-20 ENCOUNTER — Telehealth: Payer: Self-pay | Admitting: Internal Medicine

## 2014-10-20 ENCOUNTER — Other Ambulatory Visit: Payer: Self-pay

## 2014-10-20 ENCOUNTER — Emergency Department (HOSPITAL_COMMUNITY)
Admission: EM | Admit: 2014-10-20 | Discharge: 2014-10-20 | Disposition: A | Discharge status: Home / Self Care | Payer: Medicare Other | Attending: Emergency Medicine | Admitting: Emergency Medicine

## 2014-10-20 DIAGNOSIS — R109 Unspecified abdominal pain: Secondary | ICD-10-CM

## 2014-10-20 DIAGNOSIS — I10 Essential (primary) hypertension: Secondary | ICD-10-CM | POA: Diagnosis not present

## 2014-10-20 DIAGNOSIS — Z79899 Other long term (current) drug therapy: Secondary | ICD-10-CM | POA: Insufficient documentation

## 2014-10-20 DIAGNOSIS — F329 Major depressive disorder, single episode, unspecified: Secondary | ICD-10-CM | POA: Insufficient documentation

## 2014-10-20 DIAGNOSIS — E669 Obesity, unspecified: Secondary | ICD-10-CM | POA: Diagnosis not present

## 2014-10-20 DIAGNOSIS — Z8614 Personal history of Methicillin resistant Staphylococcus aureus infection: Secondary | ICD-10-CM | POA: Insufficient documentation

## 2014-10-20 DIAGNOSIS — K219 Gastro-esophageal reflux disease without esophagitis: Secondary | ICD-10-CM | POA: Diagnosis not present

## 2014-10-20 DIAGNOSIS — Z8669 Personal history of other diseases of the nervous system and sense organs: Secondary | ICD-10-CM | POA: Diagnosis not present

## 2014-10-20 DIAGNOSIS — Z87891 Personal history of nicotine dependence: Secondary | ICD-10-CM | POA: Diagnosis not present

## 2014-10-20 DIAGNOSIS — M1990 Unspecified osteoarthritis, unspecified site: Secondary | ICD-10-CM | POA: Insufficient documentation

## 2014-10-20 DIAGNOSIS — E039 Hypothyroidism, unspecified: Secondary | ICD-10-CM | POA: Insufficient documentation

## 2014-10-20 DIAGNOSIS — Z8619 Personal history of other infectious and parasitic diseases: Secondary | ICD-10-CM | POA: Insufficient documentation

## 2014-10-20 DIAGNOSIS — R1084 Generalized abdominal pain: Secondary | ICD-10-CM | POA: Diagnosis not present

## 2014-10-20 DIAGNOSIS — N400 Enlarged prostate without lower urinary tract symptoms: Secondary | ICD-10-CM | POA: Insufficient documentation

## 2014-10-20 DIAGNOSIS — R339 Retention of urine, unspecified: Secondary | ICD-10-CM | POA: Diagnosis not present

## 2014-10-20 DIAGNOSIS — R3989 Other symptoms and signs involving the genitourinary system: Secondary | ICD-10-CM | POA: Diagnosis not present

## 2014-10-20 LAB — COMPREHENSIVE METABOLIC PANEL
ALBUMIN: 3.8 g/dL (ref 3.5–5.0)
ALT: 126 U/L — ABNORMAL HIGH (ref 17–63)
ANION GAP: 7 (ref 5–15)
AST: 89 U/L — AB (ref 15–41)
Alkaline Phosphatase: 73 U/L (ref 38–126)
BUN: 11 mg/dL (ref 6–20)
CHLORIDE: 104 mmol/L (ref 101–111)
CO2: 28 mmol/L (ref 22–32)
CREATININE: 0.92 mg/dL (ref 0.61–1.24)
Calcium: 8.9 mg/dL (ref 8.9–10.3)
GFR calc non Af Amer: >60 mL/min (ref >60)
GLUCOSE: 82 mg/dL (ref 65–99)
POTASSIUM: 4.6 mmol/L (ref 3.5–5.1)
Sodium: 139 mmol/L (ref 135–145)
Total Bilirubin: 0.5 mg/dL (ref 0.3–1.2)
Total Protein: 6.4 g/dL — ABNORMAL LOW (ref 6.5–8.1)

## 2014-10-20 LAB — CBC WITH DIFFERENTIAL/PLATELET
Basophils Absolute: 0 10*3/uL (ref 0.0–0.1)
Basophils Relative: 1 % (ref 0–1)
Eosinophils Absolute: 0.1 10*3/uL (ref 0.0–0.7)
Eosinophils Relative: 2 % (ref 0–5)
HCT: 33.2 % — ABNORMAL LOW (ref 39.0–52.0)
Hemoglobin: 11.3 g/dL — ABNORMAL LOW (ref 13.0–17.0)
LYMPHS ABS: 1.3 10*3/uL (ref 0.7–4.0)
Lymphocytes Relative: 31 % (ref 12–46)
MCH: 28.7 pg (ref 26.0–34.0)
MCHC: 34 g/dL (ref 30.0–36.0)
MCV: 84.3 fL (ref 78.0–100.0)
Monocytes Absolute: 0.4 10*3/uL (ref 0.1–1.0)
Monocytes Relative: 10 % (ref 3–12)
NEUTROS PCT: 56 % (ref 43–77)
Neutro Abs: 2.5 10*3/uL (ref 1.7–7.7)
Platelets: 165 10*3/uL (ref 150–400)
RBC: 3.94 MIL/uL — ABNORMAL LOW (ref 4.22–5.81)
RDW: 13.4 % (ref 11.5–15.5)
WBC: 4.3 10*3/uL (ref 4.0–10.5)

## 2014-10-20 LAB — LACTIC ACID, PLASMA: LACTIC ACID, VENOUS: 0.9 mmol/L (ref 0.5–2.0)

## 2014-10-20 LAB — LIPASE, BLOOD: LIPASE: 12 U/L — AB (ref 22–51)

## 2014-10-20 MED ORDER — ONDANSETRON HCL 4 MG/2ML IJ SOLN
4.0000 mg | Freq: Once | INTRAMUSCULAR | Status: AC
  Administered 2014-10-20: 4 mg via INTRAVENOUS
  Filled 2014-10-20: qty 2

## 2014-10-20 MED ORDER — SODIUM CHLORIDE 0.9 % IV SOLN
INTRAVENOUS | Status: DC
  Administered 2014-10-20: 23:00:00 via INTRAVENOUS

## 2014-10-20 MED ORDER — FENTANYL CITRATE (PF) 100 MCG/2ML IJ SOLN
50.0000 ug | Freq: Once | INTRAMUSCULAR | Status: AC
  Administered 2014-10-20: 50 ug via INTRAVENOUS
  Filled 2014-10-20: qty 2

## 2014-10-20 MED ORDER — GLYCOPYRROLATE 2 MG PO TABS: 2.0000 mg | ORAL_TABLET | Freq: Three times a day (TID) | ORAL | Status: DC

## 2014-10-20 NOTE — Telephone Encounter (Signed)
Pt states that he has been taking vancomycin again for about 2 weeks. States that the mornings are terrible, that his stomach wakes him up about 5am and it takes him about 2-3 hours to get the cramping calmed down. Pt states he is having about 3 stools per day now. Pt states he is taking a probiotic BID along with vancomycin. Pt states he is taking his levbid for the cramping but wants to know if there is anything else he can take for the cramping. Please advise.

## 2014-10-20 NOTE — Discharge Instructions (Signed)

## 2014-10-20 NOTE — ED Notes (Signed)
Patient complaining of stomach cramps. Reports he is currently being treated for C-diff. Denies vomiting.

## 2014-10-20 NOTE — Telephone Encounter (Signed)
Spoke with pt and script sent to pharmacy. Pt aware.

## 2014-10-20 NOTE — Telephone Encounter (Signed)
Try Rubinol

## 2014-10-20 NOTE — ED Provider Notes (Signed)
CSN: [id number]   Arrival date & time 10/20/14 2029  History  This chart was scribed for  Kenneth Bo, MD by Altamease Oiler, ED Scribe. This patient was seen in room [facility] and the patient's care was started at 10:22 PM.  Chief Complaint  Patient presents with  . Abdominal Pain    HPI The history is provided by the patient. No language interpreter was used.   Kenneth Rhodes is a 69 y.o. male who presents to the Emergency Department complaining of constant abdominal pain with onset today. The pain is described as cramping. Oscimin sr 0.375 BID Glycopyrrol 1 mg, and Pepcid have provided insufficient pain relief. Pt is currently being treated for c-diff and notes that he has had some intermittent abdominal pain early in the mornings that usually resolves. Today the pain has been constant. Pt notes that in the past fentanyl has relieved his pain. He does not want a prescription for narcotic pain medication because he is currently recovering from an opoid addiction and being treated with suboxone. His c-diff  Is being treated with Vancomycin. Pt denies fever, weakness, dizziness, vomiting, chest pain, SOB, and cough.   GI:Dr. Henrene Pastor  Past Medical History  Diagnosis Date  . Hyperlipidemia   . MRSA (methicillin resistant staph aureus) culture positive   . Hypertension   . Hypothyroidism   . Depression   . Liver disease   . Chronic hepatitis C   . Sleep apnea   . Obesity   . GERD (gastroesophageal reflux disease)   . BPH (benign prostatic hypertrophy)   . C. difficile diarrhea   . Arthritis   . Blood transfusion without reported diagnosis   . Substance abuse     pain medication    Past Surgical History  Procedure Laterality Date  . Spine surgery    . Joint replacement Right     knee  . Lumbar epidural injection    . Chronic hep c    . C diff      Family History  Problem Relation Age of Onset  . Ovarian cancer Mother   . AAA (abdominal aortic aneurysm) Father      History  Substance Use Topics  . Smoking status: Former Smoker    Quit date: 03/21/1994  . Smokeless tobacco: Never Used  . Alcohol Use: No     Review of Systems   Home Medications   Prior to Admission medications   Medication Sig Start Date End Date Taking? Authorizing Provider  bismuth subsalicylate (PEPTO BISMOL) 262 MG/15ML suspension Take 30 mLs by mouth every 6 (six) hours as needed for diarrhea or loose stools.    Historical Provider, MD  cherry syrup syrup  10/06/14   Historical Provider, MD  citalopram (CELEXA) 40 MG tablet take 1 tablets by mouth once daily 09/29/14   Sharion Balloon, FNP  famotidine (PEPCID) 40 MG tablet Take 1 tablet (40 mg total) by mouth 2 (two) times daily. 08/13/14   Amy S Esterwood, PA-C  glycopyrrolate (ROBINUL) 2 MG tablet Take 1 tablet (2 mg total) by mouth 3 (three) times daily. 10/20/14   Irene Shipper, MD  Lactobacillus (RA ACIDOPHILUS) 300 MG CAPS Take 1 capsule by mouth 2 (two) times daily.    Historical Provider, MD  levothyroxine (SYNTHROID, LEVOTHROID) 125 MCG tablet TAKE 1 TABLET BY MOUTH EVERY DAY NOTE DOSAGE CHANGE 07/22/14   Chipper Herb, MD  omeprazole (PRILOSEC) 20 MG capsule Take 20 mg by mouth daily. 08/11/14  Historical Provider, MD  SUBOXONE 12-3 MG FILM Take 1 Film by mouth daily.  04/24/14   Historical Provider, MD  tamsulosin (FLOMAX) 0.4 MG CAPS capsule Take 0.4 mg by mouth 2 (two) times daily.  01/31/14   Historical Provider, MD  vancomycin (VANCOCIN) 50 mg/mL oral solution Take 10 mLs (500 mg total) by mouth every 6 (six) hours. Patient taking differently: Take by mouth every 6 (six) hours.  10/06/14   Fredia Sorrow, MD    Allergies  Hydrocodone  Triage Vitals: BP 146/83 mmHg  Pulse 73  Temp(Src) 98.1 F (36.7 C) (Oral)  Resp 18  Ht 6' (1.829 m)  Wt 260 lb (117.935 kg)  BMI 35.25 kg/m2  SpO2 100%  Physical Exam  Constitutional: He is oriented to person, place, and time. He appears well-developed and well-nourished.   HENT:  Head: Normocephalic and atraumatic.  Right Ear: External ear normal.  Left Ear: External ear normal.  Eyes: Conjunctivae and EOM are normal. Pupils are equal, round, and reactive to light.  Neck: Normal range of motion and phonation normal. Neck supple.  Cardiovascular: Normal rate, regular rhythm and normal heart sounds.   Pulmonary/Chest: Effort normal and breath sounds normal. He exhibits no bony tenderness.  Abdominal: Soft. There is no tenderness.  Musculoskeletal: Normal range of motion.  Neurological: He is alert and oriented to person, place, and time. No cranial nerve deficit or sensory deficit. He exhibits normal muscle tone. Coordination normal.  Skin: Skin is warm, dry and intact.  Psychiatric: He has a normal mood and affect. His behavior is normal. Judgment and thought content normal.  Nursing note and vitals reviewed.   ED Course  Procedures   DIAGNOSTIC STUDIES: Oxygen Saturation is 100% on RA, normal by my interpretation.    COORDINATION OF CARE: 10:22 PM Discussed treatment plan which includes narcotic analgesia with pt at bedside and pt agreed to plan.  Medications  0.9 %  sodium chloride infusion ( Intravenous New Bag/Given 10/20/14 2239)  fentaNYL (SUBLIMAZE) injection 50 mcg (50 mcg Intravenous Given 10/20/14 2240)  ondansetron (ZOFRAN) injection 4 mg (4 mg Intravenous Given 10/20/14 2240)     Patient Vitals for the past 24 hrs:  BP Temp Temp src Pulse Resp SpO2 Height Weight  10/20/14 2034 - 98.1 F (36.7 C) Oral - - - - -  10/20/14 2032 146/83 mmHg 98.1 F (36.7 C) Oral 73 18 100 % 6' (1.829 m) 260 lb (117.935 kg)   11:32 PM Reevaluation with update and discussion. After initial assessment and treatment, an updated evaluation reveals his pain is controlled now and he is ready to go home. He and family. Informed of findings. Kenneth Rhodes    Labs Review-  Labs Reviewed  LIPASE, BLOOD - Abnormal; Notable for the following:    Lipase 12 (*)     All other components within normal limits  COMPREHENSIVE METABOLIC PANEL - Abnormal; Notable for the following:    Total Protein 6.4 (*)    AST 89 (*)    ALT 126 (*)    All other components within normal limits  CBC WITH DIFFERENTIAL/PLATELET - Abnormal; Notable for the following:    RBC 3.94 (*)    Hemoglobin 11.3 (*)    HCT 33.2 (*)    All other components within normal limits  LACTIC ACID, PLASMA  LACTIC ACID, PLASMA  URINALYSIS, ROUTINE W REFLEX MICROSCOPIC (NOT AT [facility])        MDM   Final diagnoses:  Abdominal cramping  Nonspecific abdominal cramping. He is currently being treated for C. difficile enterocolitis. Patient nontoxic with normal vital signs. Doubt sepsis or impending vascular collapse  Nursing Notes Reviewed/ Care Coordinated Applicable Imaging Reviewed Interpretation of Laboratory Data incorporated into ED treatment  The patient appears reasonably screened and/or stabilized for discharge and I doubt any other medical condition or other [facility] requiring further screening, evaluation, or treatment in the ED at this time prior to discharge.  Plan: Home Medications- usual; Home Treatments- rest; return here if the recommended treatment, does not improve the symptoms; Recommended follow up- PCP prn   I personally performed the services described in this documentation, which was scribed in my presence. The recorded information has been reviewed and is accurate.      Kenneth Bo, MD 10/20/14 [phone]

## 2014-10-21 ENCOUNTER — Telehealth: Payer: Self-pay | Admitting: Internal Medicine

## 2014-10-21 DIAGNOSIS — R339 Retention of urine, unspecified: Secondary | ICD-10-CM | POA: Diagnosis not present

## 2014-10-21 DIAGNOSIS — R1033 Periumbilical pain: Secondary | ICD-10-CM | POA: Diagnosis not present

## 2014-10-21 DIAGNOSIS — R109 Unspecified abdominal pain: Secondary | ICD-10-CM | POA: Diagnosis not present

## 2014-10-21 DIAGNOSIS — N319 Neuromuscular dysfunction of bladder, unspecified: Secondary | ICD-10-CM | POA: Diagnosis not present

## 2014-10-21 DIAGNOSIS — R1030 Lower abdominal pain, unspecified: Secondary | ICD-10-CM | POA: Diagnosis not present

## 2014-10-21 DIAGNOSIS — Z79899 Other long term (current) drug therapy: Secondary | ICD-10-CM | POA: Diagnosis not present

## 2014-10-21 DIAGNOSIS — I10 Essential (primary) hypertension: Secondary | ICD-10-CM | POA: Diagnosis not present

## 2014-10-21 DIAGNOSIS — R197 Diarrhea, unspecified: Secondary | ICD-10-CM | POA: Diagnosis not present

## 2014-10-21 NOTE — Telephone Encounter (Signed)
I agree with ER evaluation, not clear why he should have such severe pain with his current GI diagnoses. We will not provide him with "pain medication". He has a history of opiate addiction

## 2014-10-21 NOTE — Telephone Encounter (Signed)
Pt states he went to the ER today and they did not find anything wrong with him. Pt states he is having a lot of abdominal pain and cramping. Pt has been given multiple meds for cramping and states they are not helping. Pt scheduled to see Alonza Bogus PA tomorrow at 2pm. Pt aware of appt.

## 2014-10-21 NOTE — Telephone Encounter (Signed)
Pt states that he is still having terrible abdominal cramping, states the Robinol did not help. That script was not called in until 5pm yesterday. Pt states the cramping and discomfort is a 9 out of 9 on the pain scale. Discussed with pt that he could come for an OV with midlevel or go to the ER and that we do not give pain medicine for cramping. Pt states he is currently on the way to [facility] ER. Pt also stated that he took an Imodium yesterday and that he is constipated, has not had a BM today. Let pt know it may take more than a day to have a BM. Pt instructed to call back with an update from the ER. Dr. Henrene Pastor notified.

## 2014-10-22 ENCOUNTER — Encounter: Payer: Self-pay | Admitting: Gastroenterology

## 2014-10-22 ENCOUNTER — Ambulatory Visit (INDEPENDENT_AMBULATORY_CARE_PROVIDER_SITE_OTHER): Payer: Medicare Other | Admitting: Gastroenterology

## 2014-10-22 VITALS — BP 110/84 | HR 72 | Ht 72.0 in | Wt 268.4 lb

## 2014-10-22 DIAGNOSIS — R197 Diarrhea, unspecified: Secondary | ICD-10-CM | POA: Diagnosis not present

## 2014-10-22 DIAGNOSIS — R1030 Lower abdominal pain, unspecified: Secondary | ICD-10-CM

## 2014-10-22 NOTE — Progress Notes (Addendum)
     10/22/2014 Kenneth Rhodes [id number] 08-06-1945   History of Present Illness:  This is a 69 year old male who is recently known to Dr. Henrene Pastor.    Stool studies revealed positive Clostridium difficile on 6/7 for which he was started on metronidazole 250 mg 4 times a day 10 days. He reported no improvement in his bowels. Also complaining of lower abdominal cramping discomfort. He subsequently started vancomycin 250 mg 4 times a day on June 16. He contacted the office June 21 complaining of ongoing symptoms and Vancomycin was increased to 500 mg daily.  He was then seen on June 24th and was having some improvement, but was told to complete another two week course of Vancomycin (completed this July 12th).  Since that time he has called our office multiple times and has been to the ED on several occasions complaining of lower abdominal cramping, however.  Says that it is keeping him up at night and he is pacing the floors.  He went to the ED on 7/18 at which time they collected another stool specimen, but renewed his vancomycin prescription for another 4 weeks, suspecting that he had recurrent Cdiff as the cause of his pain.  Stool study ended up coming back negative but patient was never made aware and is still taking the Vancomycin prescription.  His biggest complaint continues to be pain.  No improvement with Robinul.  Initially maybe had some improvement with Hyomax 0.375 mg, however, no longer seems to be working.  Still having some diarrhea.  Took an Imodium yesterday, which helped, but now having diarrhea again today.  Two CT scans, one at [facility] on 7/18 and one at [facility] just yesterday were unremarkable/normal.   Current Medications, Allergies, Past Medical History, Past Surgical History, Family History and Social History were reviewed in [employer] record.   Physical Exam: BP 110/84 mmHg  Pulse 72  Ht 6' (1.829 m)  Wt 268 lb 6.4 oz (121.745 kg)   BMI 36.39 kg/m2 General: Well developed white male in no acute distress Head: Normocephalic and atraumatic Eyes:  Sclerae anicteric, conjunctiva pink  Ears: Normal auditory acuity Lungs: Clear throughout to auscultation Heart: Regular rate and rhythm Abdomen: Soft, non-distended.  Normal bowel sounds.  Non-tender. Musculoskeletal: Symmetrical with no gross deformities  Extremities: No edema  Neurological: Alert oriented x 4, grossly non-focal Psychological:  Alert and cooperative. Normal mood and affect  Assessment and Recommendations: -Lower abdominal pain:  We do not think that this is GI in origin.  He does have indwelling urinary catheter so ? If he is having bladder spasm related to that.  Will have him return to see his urologist to discuss. -Diarrhea:  Previously diagnosed with Cdiff, but negative stool study two weeks ago.  Is still on Vancomycin, but we will have him discontinue this for now and see how he does.

## 2014-10-22 NOTE — Patient Instructions (Addendum)
Discontinue Vancomycin.  Follow up with Urologist.

## 2014-10-23 DIAGNOSIS — N319 Neuromuscular dysfunction of bladder, unspecified: Secondary | ICD-10-CM | POA: Diagnosis not present

## 2014-10-23 DIAGNOSIS — N3289 Other specified disorders of bladder: Secondary | ICD-10-CM | POA: Diagnosis not present

## 2014-10-23 DIAGNOSIS — R339 Retention of urine, unspecified: Secondary | ICD-10-CM | POA: Diagnosis not present

## 2014-10-23 NOTE — Progress Notes (Signed)
Patient seen and examined along with the physician assistant. Agree with assessment and plan as above. Clearly no GI explanation for "severe spasms" in the lower mid abdomen. Most likely bladder related with indwelling catheter. He was asked to contact his urologist.

## 2014-11-04 DIAGNOSIS — L820 Inflamed seborrheic keratosis: Secondary | ICD-10-CM | POA: Diagnosis not present

## 2014-11-04 DIAGNOSIS — M792 Neuralgia and neuritis, unspecified: Secondary | ICD-10-CM | POA: Diagnosis not present

## 2014-11-05 ENCOUNTER — Emergency Department (HOSPITAL_COMMUNITY)
Admission: EM | Admit: 2014-11-05 | Discharge: 2014-11-05 | Disposition: A | Discharge status: Home / Self Care | Payer: Medicare Other | Attending: Emergency Medicine | Admitting: Emergency Medicine

## 2014-11-05 ENCOUNTER — Telehealth: Payer: Self-pay | Admitting: Internal Medicine

## 2014-11-05 ENCOUNTER — Other Ambulatory Visit: Payer: Self-pay | Admitting: Physician Assistant

## 2014-11-05 ENCOUNTER — Encounter (HOSPITAL_COMMUNITY): Payer: Self-pay | Admitting: Emergency Medicine

## 2014-11-05 DIAGNOSIS — G8929 Other chronic pain: Secondary | ICD-10-CM | POA: Diagnosis not present

## 2014-11-05 DIAGNOSIS — R1030 Lower abdominal pain, unspecified: Secondary | ICD-10-CM | POA: Diagnosis not present

## 2014-11-05 DIAGNOSIS — K219 Gastro-esophageal reflux disease without esophagitis: Secondary | ICD-10-CM | POA: Diagnosis not present

## 2014-11-05 DIAGNOSIS — F329 Major depressive disorder, single episode, unspecified: Secondary | ICD-10-CM | POA: Diagnosis not present

## 2014-11-05 DIAGNOSIS — Z87438 Personal history of other diseases of male genital organs: Secondary | ICD-10-CM | POA: Insufficient documentation

## 2014-11-05 DIAGNOSIS — R3915 Urgency of urination: Secondary | ICD-10-CM | POA: Insufficient documentation

## 2014-11-05 DIAGNOSIS — E669 Obesity, unspecified: Secondary | ICD-10-CM | POA: Insufficient documentation

## 2014-11-05 DIAGNOSIS — E039 Hypothyroidism, unspecified: Secondary | ICD-10-CM | POA: Diagnosis not present

## 2014-11-05 DIAGNOSIS — R197 Diarrhea, unspecified: Secondary | ICD-10-CM

## 2014-11-05 DIAGNOSIS — Z8739 Personal history of other diseases of the musculoskeletal system and connective tissue: Secondary | ICD-10-CM | POA: Diagnosis not present

## 2014-11-05 DIAGNOSIS — I10 Essential (primary) hypertension: Secondary | ICD-10-CM | POA: Insufficient documentation

## 2014-11-05 DIAGNOSIS — R52 Pain, unspecified: Secondary | ICD-10-CM | POA: Diagnosis not present

## 2014-11-05 DIAGNOSIS — Z8619 Personal history of other infectious and parasitic diseases: Secondary | ICD-10-CM | POA: Insufficient documentation

## 2014-11-05 DIAGNOSIS — N3289 Other specified disorders of bladder: Secondary | ICD-10-CM | POA: Diagnosis not present

## 2014-11-05 DIAGNOSIS — F410 Panic disorder [episodic paroxysmal anxiety] without agoraphobia: Secondary | ICD-10-CM | POA: Diagnosis not present

## 2014-11-05 DIAGNOSIS — R7989 Other specified abnormal findings of blood chemistry: Secondary | ICD-10-CM | POA: Diagnosis not present

## 2014-11-05 DIAGNOSIS — Z87891 Personal history of nicotine dependence: Secondary | ICD-10-CM | POA: Diagnosis not present

## 2014-11-05 DIAGNOSIS — R350 Frequency of micturition: Secondary | ICD-10-CM | POA: Insufficient documentation

## 2014-11-05 DIAGNOSIS — R300 Dysuria: Secondary | ICD-10-CM | POA: Diagnosis not present

## 2014-11-05 DIAGNOSIS — Z8659 Personal history of other mental and behavioral disorders: Secondary | ICD-10-CM | POA: Insufficient documentation

## 2014-11-05 DIAGNOSIS — Z79899 Other long term (current) drug therapy: Secondary | ICD-10-CM | POA: Diagnosis not present

## 2014-11-05 DIAGNOSIS — R410 Disorientation, unspecified: Secondary | ICD-10-CM | POA: Diagnosis not present

## 2014-11-05 DIAGNOSIS — R1110 Vomiting, unspecified: Secondary | ICD-10-CM | POA: Diagnosis not present

## 2014-11-05 DIAGNOSIS — R945 Abnormal results of liver function studies: Secondary | ICD-10-CM | POA: Diagnosis not present

## 2014-11-05 DIAGNOSIS — F419 Anxiety disorder, unspecified: Secondary | ICD-10-CM | POA: Diagnosis not present

## 2014-11-05 LAB — URINALYSIS, ROUTINE W REFLEX MICROSCOPIC
BILIRUBIN URINE: NEGATIVE
Glucose, UA: NEGATIVE mg/dL
HGB URINE DIPSTICK: NEGATIVE
Ketones, ur: NEGATIVE mg/dL
Leukocytes, UA: NEGATIVE
NITRITE: NEGATIVE
Protein, ur: NEGATIVE mg/dL
Urobilinogen, UA: 0.2 mg/dL (ref 0.0–1.0)
pH: 7 (ref 5.0–8.0)

## 2014-11-05 LAB — BASIC METABOLIC PANEL
Anion gap: 8 (ref 5–15)
BUN: 11 mg/dL (ref 6–20)
CALCIUM: 8.7 mg/dL — AB (ref 8.9–10.3)
CO2: 26 mmol/L (ref 22–32)
Chloride: 102 mmol/L (ref 101–111)
Creatinine, Ser: 0.95 mg/dL (ref 0.61–1.24)
GFR calc Af Amer: >60 mL/min (ref >60)
GLUCOSE: 96 mg/dL (ref 65–99)
Potassium: 4.3 mmol/L (ref 3.5–5.1)
Sodium: 136 mmol/L (ref 135–145)

## 2014-11-05 LAB — CBC WITH DIFFERENTIAL/PLATELET
BASOS ABS: 0 10*3/uL (ref 0.0–0.1)
BASOS PCT: 1 % (ref 0–1)
EOS PCT: 1 % (ref 0–5)
Eosinophils Absolute: 0 10*3/uL (ref 0.0–0.7)
HEMATOCRIT: 32.8 % — AB (ref 39.0–52.0)
Hemoglobin: 11.2 g/dL — ABNORMAL LOW (ref 13.0–17.0)
Lymphocytes Relative: 18 % (ref 12–46)
Lymphs Abs: 0.8 10*3/uL (ref 0.7–4.0)
MCH: 28.6 pg (ref 26.0–34.0)
MCHC: 34.1 g/dL (ref 30.0–36.0)
MCV: 83.9 fL (ref 78.0–100.0)
MONO ABS: 0.3 10*3/uL (ref 0.1–1.0)
MONOS PCT: 8 % (ref 3–12)
NEUTROS ABS: 3.2 10*3/uL (ref 1.7–7.7)
Neutrophils Relative %: 72 % (ref 43–77)
PLATELETS: 180 10*3/uL (ref 150–400)
RBC: 3.91 MIL/uL — ABNORMAL LOW (ref 4.22–5.81)
RDW: 13.5 % (ref 11.5–15.5)
WBC: 4.4 10*3/uL (ref 4.0–10.5)

## 2014-11-05 NOTE — Telephone Encounter (Signed)
Pt states that his diarrhea has returned and the cramping. Pt is concerned and thinks his cdiff may have returned. Please advise.

## 2014-11-05 NOTE — ED Notes (Signed)
Pt self cathed for urine sample

## 2014-11-05 NOTE — ED Provider Notes (Signed)
CSN: [id number]     Arrival date & time 11/05/14  1812 History   First MD Initiated Contact with Patient 11/05/14 1819     Chief Complaint  Patient presents with  . Dysuria     (Consider location/radiation/quality/duration/timing/severity/associated sxs/prior Treatment) Patient is a 69 y.o. male presenting with dysuria. The history is provided by the patient.  Dysuria This is a new problem. The current episode started less than 1 hour ago. The problem occurs constantly. The problem has not changed since onset.Pertinent negatives include no chest pain, no abdominal pain, no headaches and no shortness of breath. Nothing aggravates the symptoms. Nothing relieves the symptoms. He has tried nothing for the symptoms. The treatment provided no relief.   69 yo M with a chief complaint of dysuria. Patient has been self cathing at home for the past 2 weeks. Patient has a history of MRSA epidural abscess with resultant bilateral lower extremity weakness and inability to urinate. Patient also has been recently treated for Clostridium difficile. Patient currently taking oral vancomycin as instructed by his gastroenterologist. Patient denies any fevers or chills. Patient has had some increased frequency and urgency with this. Denies any hematuria. Chronic back pain but unchanged recently. Some mild confusion today has resolved on arrival to the ED.  Past Medical History  Diagnosis Date  . Hyperlipidemia   . MRSA (methicillin resistant staph aureus) culture positive   . Hypertension   . Hypothyroidism   . Depression   . Liver disease   . Chronic hepatitis C   . Sleep apnea   . Obesity   . GERD (gastroesophageal reflux disease)   . BPH (benign prostatic hypertrophy)   . C. difficile diarrhea   . Arthritis   . Blood transfusion without reported diagnosis   . Substance abuse     pain medication   Past Surgical History  Procedure Laterality Date  . Spine surgery      due to mrsa  . Medial partial  knee replacement Right   . Lumbar epidural injection    . Chronic hep c    . C diff    . Lumbar disc surgery     Family History  Problem Relation Age of Onset  . Ovarian cancer Mother   . AAA (abdominal aortic aneurysm) Father   . Colon cancer Neg Hx   . Esophageal cancer Neg Hx   . Kidney disease Neg Hx   . Liver disease Neg Hx    Social History  Substance Use Topics  . Smoking status: Former Smoker    Quit date: 03/21/1994  . Smokeless tobacco: Never Used  . Alcohol Use: No    Review of Systems  Constitutional: Negative for fever and chills.  HENT: Negative for congestion and facial swelling.   Eyes: Negative for discharge and visual disturbance.  Respiratory: Negative for shortness of breath.   Cardiovascular: Negative for chest pain and palpitations.  Gastrointestinal: Negative for vomiting, abdominal pain and diarrhea.  Genitourinary: Positive for dysuria, urgency and frequency. Negative for flank pain.  Musculoskeletal: Positive for back pain (chronic and unchanged). Negative for myalgias and arthralgias.  Skin: Negative for color change and rash.  Neurological: Negative for tremors, syncope and headaches.       Mild confusion   Psychiatric/Behavioral: Negative for confusion and dysphoric mood.      Allergies  Hydrocodone  Home Medications   Prior to Admission medications   Medication Sig Start Date End Date Taking? Authorizing Provider  bismuth subsalicylate (PEPTO  BISMOL) 262 MG/15ML suspension Take 30 mLs by mouth every 6 (six) hours as needed for diarrhea or loose stools.   Yes Historical Provider, MD  famotidine (PEPCID) 10 MG tablet Take 10 mg by mouth daily as needed for heartburn or indigestion.   Yes Historical Provider, MD  glycopyrrolate (ROBINUL) 1 MG tablet Take 2 mg by mouth 3 (three) times daily.   Yes Historical Provider, MD  hyoscyamine (OSCIMIN SR) 0.375 MG 12 hr tablet Take 0.375 mg by mouth 2 (two) times daily.   Yes Historical Provider, MD   Lactobacillus (RA ACIDOPHILUS) 300 MG CAPS Take 1 capsule by mouth 2 (two) times daily.   Yes Historical Provider, MD  levothyroxine (SYNTHROID, LEVOTHROID) 125 MCG tablet TAKE 1 TABLET BY MOUTH EVERY DAY NOTE DOSAGE CHANGE 07/22/14  Yes Chipper Herb, MD  mirabegron ER (MYRBETRIQ) 25 MG TB24 tablet Take 25 mg by mouth daily.   Yes Historical Provider, MD  omeprazole (PRILOSEC) 20 MG capsule Take 20 mg by mouth daily. 08/11/14  Yes Historical Provider, MD  oxyCODONE-acetaminophen (PERCOCET/ROXICET) 5-325 MG per tablet Take by mouth every 4 (four) hours as needed for moderate pain or severe pain.   Yes Historical Provider, MD  pregabalin (LYRICA) 150 MG capsule Take 150 mg by mouth 2 (two) times daily.   Yes Historical Provider, MD  solifenacin (VESICARE) 10 MG tablet Take 10 mg by mouth daily.   Yes Historical Provider, MD  SUBOXONE 12-3 MG FILM Take 1 Film by mouth daily.  04/24/14  Yes Historical Provider, MD  tamsulosin (FLOMAX) 0.4 MG CAPS capsule Take 0.4 mg by mouth 2 (two) times daily.  01/31/14  Yes Historical Provider, MD  famotidine (PEPCID) 40 MG tablet take 1 tablet by mouth twice a day Patient not taking: Reported on 11/05/2014 11/05/14   Amy S Esterwood, PA-C  glycopyrrolate (ROBINUL) 2 MG tablet Take 1 tablet (2 mg total) by mouth 3 (three) times daily. Patient not taking: Reported on 11/05/2014 10/20/14   Irene Shipper, MD  vancomycin (VANCOCIN) 50 mg/mL oral solution Take 10 mLs (500 mg total) by mouth every 6 (six) hours. Patient taking differently: Take by mouth every 6 (six) hours.  10/06/14   Fredia Sorrow, MD  Vitamin D, Ergocalciferol, (DRISDOL) 50000 UNITS CAPS capsule Take 50,000 Units by mouth every 7 (seven) days.  10/01/14   Historical Provider, MD   BP 162/76 mmHg  Pulse 84  Temp(Src) 98.7 F (37.1 C) (Oral)  Resp 20  Ht 6\' 1"  (1.854 m)  Wt 265 lb (120.203 kg)  BMI 34.97 kg/m2  SpO2 99% Physical Exam  Constitutional: He is oriented to person, place, and time. He  appears well-developed and well-nourished.  HENT:  Head: Normocephalic and atraumatic.  Eyes: EOM are normal. Pupils are equal, round, and reactive to light.  Neck: Normal range of motion. Neck supple. No JVD present.  Cardiovascular: Normal rate and regular rhythm.  Exam reveals no gallop and no friction rub.   No murmur heard. Pulmonary/Chest: No respiratory distress. He has no wheezes.  Abdominal: He exhibits no distension. There is tenderness (mild suprapubic). There is no rebound and no guarding.  Genitourinary: Testes normal and penis normal. Cremasteric reflex is present. Right testis shows no mass and no tenderness. Left testis shows no mass and no tenderness. Circumcised. No penile erythema. No discharge found.  Musculoskeletal: Normal range of motion.  Neurological: He is alert and oriented to person, place, and time.  Skin: No rash noted. No pallor.  Psychiatric: He has a normal mood and affect. His behavior is normal.    ED Course  Procedures (including critical care time) Labs Review Labs Reviewed  URINALYSIS, ROUTINE W REFLEX MICROSCOPIC (NOT AT [facility]) - Abnormal; Notable for the following:    Color, Urine STRAW (*)    Specific Gravity, Urine <1.005 (*)    All other components within normal limits  BASIC METABOLIC PANEL - Abnormal; Notable for the following:    Calcium 8.7 (*)    All other components within normal limits  CBC WITH DIFFERENTIAL/PLATELET - Abnormal; Notable for the following:    RBC 3.91 (*)    Hemoglobin 11.2 (*)    HCT 32.8 (*)    All other components within normal limits  URINE CULTURE    Imaging Review No results found. I have personally reviewed and evaluated these images and lab results as part of my medical decision-making.   EKG Interpretation None      MDM   Final diagnoses:  Dysuria    69 yo M with a chief complaint of dysuria and increased frequency. Patient self caths and so is at high risk for UTI. Recently having difficulty  with Clostridium difficile. We'll obtain a UA urine culture CBC BMP.  UA negative for infection as read by me. UA sent for culture. Feel this unlikely bladder spasms. Patient currently on multiple medications for the same. Will have patient contact his urologist to discuss different medications. Feel prophylactic antibiotics at this point would be unhelpful with patient's history of Clostridium difficile.   I have discussed the diagnosis/risks/treatment options with the patient and family and believe the pt to be eligible for discharge home to follow-up with PCP, urology. We also discussed returning to the ED immediately if new or worsening sx occur. We discussed the sx which are most concerning (e.g., fever, abdominal pain, vomiting) that necessitate immediate return. Medications administered to the patient during their visit and any new prescriptions provided to the patient are listed below.  Medications given during this visit Medications - No data to display  Discharge Medication List as of 11/05/2014  8:27 PM       The patient appears reasonably screen and/or stabilized for discharge and I doubt any other medical condition or other [facility] requiring further screening, evaluation, or treatment in the ED at this time prior to discharge.    Deno Etienne, DO 11/05/14 2200

## 2014-11-05 NOTE — Telephone Encounter (Signed)
Spoke with pt and he had the catheter removed. He does have vancomycin, will come to lab and start vanc.

## 2014-11-05 NOTE — ED Notes (Signed)
Pt made aware a urine specimen is needed. Pt self caths at home. Pt requesting to self cath for urine sample. Pt given kit.

## 2014-11-05 NOTE — Telephone Encounter (Signed)
We saw him 2 weeks ago in the office. 1. His lower abdominal/suprapubic cramping at that time was not felt to be GI. He has an indwelling bladder catheter. Did he see his urologist? 2. He should still have vancomycin at home. He can empirically restart that. If he does not, see #3 below. 3. Stool for Clostridium difficile by PCR ASAP. If negative, saw vancomycin. If positive, continue vancomycin for 4 weeks. Prescribe additionally if needed.

## 2014-11-05 NOTE — Telephone Encounter (Signed)
Let pt know ok for sister to pick up kit from lab.

## 2014-11-05 NOTE — ED Notes (Addendum)
Per EMS, pt from home c/o of increased urinary frequency and "burning in his bladder" x 2 days. Pt self catheterizes at home.

## 2014-11-05 NOTE — Discharge Instructions (Signed)

## 2014-11-06 ENCOUNTER — Telehealth: Payer: Self-pay | Admitting: Internal Medicine

## 2014-11-06 ENCOUNTER — Other Ambulatory Visit: Payer: Medicare Other

## 2014-11-06 DIAGNOSIS — R197 Diarrhea, unspecified: Secondary | ICD-10-CM

## 2014-11-06 NOTE — Telephone Encounter (Signed)
Noted  

## 2014-11-07 LAB — URINE CULTURE: Culture: NO GROWTH

## 2014-11-09 LAB — CLOSTRIDIUM DIFFICILE BY PCR

## 2014-11-10 ENCOUNTER — Other Ambulatory Visit: Payer: Self-pay

## 2014-11-10 ENCOUNTER — Other Ambulatory Visit: Payer: Medicare Other

## 2014-11-10 ENCOUNTER — Telehealth: Payer: Self-pay | Admitting: Internal Medicine

## 2014-11-10 DIAGNOSIS — R197 Diarrhea, unspecified: Secondary | ICD-10-CM

## 2014-11-10 MED ORDER — DICYCLOMINE HCL 10 MG PO CAPS: 10.0000 mg | ORAL_CAPSULE | Freq: Three times a day (TID) | ORAL | Status: DC

## 2014-11-10 NOTE — Telephone Encounter (Signed)
Patient called back wanting to know if he still needs to continue taking clyndomycin(sp?)

## 2014-11-10 NOTE — Telephone Encounter (Signed)
Spoke with pt and he knows to continue vancomycin. Script sent in for bentyl

## 2014-11-10 NOTE — Telephone Encounter (Signed)
Pt states he is still having very watery diarrhea. Pt will come back in and give another specimen for cdiff. Pt states he is still having lots of cramping. States he has robinol and hyomax but they are not helping. Pt has had bentyl in the past. Can this be sent in for pt?

## 2014-11-10 NOTE — Telephone Encounter (Signed)
If it helps.

## 2014-11-11 LAB — CLOSTRIDIUM DIFFICILE BY PCR: CDIFFPCR: NOT DETECTED

## 2014-11-16 ENCOUNTER — Other Ambulatory Visit: Payer: Self-pay | Admitting: Internal Medicine

## 2014-11-18 ENCOUNTER — Telehealth: Payer: Self-pay | Admitting: Internal Medicine

## 2014-11-18 NOTE — Telephone Encounter (Signed)
Pt states he wakes up every morning at 5am with terrible abdominal cramping. States he has about 2 liquid stools and then he doesn't have any more cramping. Pt states he has taken the Robinul, Bentyl and levbid but they are not helping. Any other recommendations? Please advise.

## 2014-11-18 NOTE — Telephone Encounter (Signed)
Pt has Robinul, Bentyl, and Levbid that has been prescribed in the past that he should have on hand to take. Called and left message for him to call back.

## 2014-11-18 NOTE — Telephone Encounter (Signed)
Nothing else to offer. 

## 2014-11-19 NOTE — Telephone Encounter (Signed)
Pt aware.

## 2014-11-26 ENCOUNTER — Telehealth: Payer: Self-pay | Admitting: Internal Medicine

## 2014-11-26 ENCOUNTER — Other Ambulatory Visit: Payer: Self-pay

## 2014-11-26 DIAGNOSIS — R1013 Epigastric pain: Secondary | ICD-10-CM | POA: Diagnosis not present

## 2014-11-26 DIAGNOSIS — F329 Major depressive disorder, single episode, unspecified: Secondary | ICD-10-CM | POA: Diagnosis not present

## 2014-11-26 DIAGNOSIS — Z79899 Other long term (current) drug therapy: Secondary | ICD-10-CM | POA: Diagnosis not present

## 2014-11-26 DIAGNOSIS — R1084 Generalized abdominal pain: Secondary | ICD-10-CM | POA: Diagnosis not present

## 2014-11-26 DIAGNOSIS — N3000 Acute cystitis without hematuria: Secondary | ICD-10-CM | POA: Diagnosis not present

## 2014-11-26 DIAGNOSIS — R197 Diarrhea, unspecified: Secondary | ICD-10-CM | POA: Diagnosis not present

## 2014-11-26 DIAGNOSIS — Z87891 Personal history of nicotine dependence: Secondary | ICD-10-CM | POA: Diagnosis not present

## 2014-11-26 DIAGNOSIS — I10 Essential (primary) hypertension: Secondary | ICD-10-CM | POA: Diagnosis not present

## 2014-11-26 DIAGNOSIS — E039 Hypothyroidism, unspecified: Secondary | ICD-10-CM | POA: Diagnosis not present

## 2014-11-26 DIAGNOSIS — Z8619 Personal history of other infectious and parasitic diseases: Secondary | ICD-10-CM | POA: Diagnosis not present

## 2014-11-26 DIAGNOSIS — N390 Urinary tract infection, site not specified: Secondary | ICD-10-CM | POA: Diagnosis not present

## 2014-11-26 DIAGNOSIS — R1033 Periumbilical pain: Secondary | ICD-10-CM | POA: Diagnosis not present

## 2014-11-26 NOTE — Telephone Encounter (Signed)
Pt states he had diarrhea again this morning and pt thinks he may have cdiff again. Pt to come and give a stool specimen to see if he is positive for cdiff. Dr. Henrene Pastor aware.

## 2014-11-27 ENCOUNTER — Encounter (HOSPITAL_COMMUNITY): Payer: Self-pay | Admitting: Emergency Medicine

## 2014-11-27 ENCOUNTER — Emergency Department (HOSPITAL_COMMUNITY)
Admission: EM | Admit: 2014-11-27 | Discharge: 2014-11-27 | Disposition: A | Discharge status: Home / Self Care | Payer: Medicare Other | Attending: Emergency Medicine | Admitting: Emergency Medicine

## 2014-11-27 DIAGNOSIS — E669 Obesity, unspecified: Secondary | ICD-10-CM | POA: Diagnosis not present

## 2014-11-27 DIAGNOSIS — R1030 Lower abdominal pain, unspecified: Secondary | ICD-10-CM | POA: Insufficient documentation

## 2014-11-27 DIAGNOSIS — Z8659 Personal history of other mental and behavioral disorders: Secondary | ICD-10-CM | POA: Diagnosis not present

## 2014-11-27 DIAGNOSIS — Z8619 Personal history of other infectious and parasitic diseases: Secondary | ICD-10-CM | POA: Diagnosis not present

## 2014-11-27 DIAGNOSIS — Z79899 Other long term (current) drug therapy: Secondary | ICD-10-CM | POA: Diagnosis not present

## 2014-11-27 DIAGNOSIS — Z87891 Personal history of nicotine dependence: Secondary | ICD-10-CM | POA: Insufficient documentation

## 2014-11-27 DIAGNOSIS — R109 Unspecified abdominal pain: Secondary | ICD-10-CM

## 2014-11-27 DIAGNOSIS — R300 Dysuria: Secondary | ICD-10-CM | POA: Insufficient documentation

## 2014-11-27 DIAGNOSIS — I10 Essential (primary) hypertension: Secondary | ICD-10-CM | POA: Insufficient documentation

## 2014-11-27 DIAGNOSIS — Z87438 Personal history of other diseases of male genital organs: Secondary | ICD-10-CM | POA: Diagnosis not present

## 2014-11-27 DIAGNOSIS — K219 Gastro-esophageal reflux disease without esophagitis: Secondary | ICD-10-CM | POA: Diagnosis not present

## 2014-11-27 DIAGNOSIS — E079 Disorder of thyroid, unspecified: Secondary | ICD-10-CM | POA: Diagnosis not present

## 2014-11-27 LAB — CBC WITH DIFFERENTIAL/PLATELET
Basophils Absolute: 0 10*3/uL (ref 0.0–0.1)
Basophils Relative: 0 % (ref 0–1)
EOS ABS: 0.1 10*3/uL (ref 0.0–0.7)
EOS PCT: 1 % (ref 0–5)
HCT: 33.1 % — ABNORMAL LOW (ref 39.0–52.0)
Hemoglobin: 11.3 g/dL — ABNORMAL LOW (ref 13.0–17.0)
LYMPHS ABS: 1 10*3/uL (ref 0.7–4.0)
Lymphocytes Relative: 18 % (ref 12–46)
MCH: 28.6 pg (ref 26.0–34.0)
MCHC: 34.1 g/dL (ref 30.0–36.0)
MCV: 83.8 fL (ref 78.0–100.0)
Monocytes Absolute: 0.6 10*3/uL (ref 0.1–1.0)
Monocytes Relative: 11 % (ref 3–12)
Neutro Abs: 3.8 10*3/uL (ref 1.7–7.7)
Neutrophils Relative %: 70 % (ref 43–77)
PLATELETS: 189 10*3/uL (ref 150–400)
RBC: 3.95 MIL/uL — AB (ref 4.22–5.81)
RDW: 13 % (ref 11.5–15.5)
WBC: 5.5 10*3/uL (ref 4.0–10.5)

## 2014-11-27 LAB — URINALYSIS, ROUTINE W REFLEX MICROSCOPIC
Bilirubin Urine: NEGATIVE
Glucose, UA: NEGATIVE mg/dL
Hgb urine dipstick: NEGATIVE
Ketones, ur: NEGATIVE mg/dL
Nitrite: NEGATIVE
PROTEIN: NEGATIVE mg/dL
Specific Gravity, Urine: 1.015 (ref 1.005–1.030)
UROBILINOGEN UA: 0.2 mg/dL (ref 0.0–1.0)
pH: 8.5 — ABNORMAL HIGH (ref 5.0–8.0)

## 2014-11-27 LAB — COMPREHENSIVE METABOLIC PANEL
ALT: 62 U/L (ref 17–63)
ANION GAP: 7 (ref 5–15)
AST: 50 U/L — ABNORMAL HIGH (ref 15–41)
Albumin: 3.7 g/dL (ref 3.5–5.0)
Alkaline Phosphatase: 85 U/L (ref 38–126)
BUN: 8 mg/dL (ref 6–20)
CHLORIDE: 104 mmol/L (ref 101–111)
CO2: 27 mmol/L (ref 22–32)
Calcium: 8.8 mg/dL — ABNORMAL LOW (ref 8.9–10.3)
Creatinine, Ser: 0.87 mg/dL (ref 0.61–1.24)
GFR calc non Af Amer: >60 mL/min (ref >60)
Glucose, Bld: 105 mg/dL — ABNORMAL HIGH (ref 65–99)
POTASSIUM: 3.6 mmol/L (ref 3.5–5.1)
SODIUM: 138 mmol/L (ref 135–145)
Total Bilirubin: 0.6 mg/dL (ref 0.3–1.2)
Total Protein: 6.8 g/dL (ref 6.5–8.1)

## 2014-11-27 LAB — URINE MICROSCOPIC-ADD ON

## 2014-11-27 LAB — LIPASE, BLOOD: Lipase: <10 U/L — ABNORMAL LOW (ref 22–51)

## 2014-11-27 MED ORDER — ONDANSETRON HCL 4 MG/2ML IJ SOLN
4.0000 mg | Freq: Once | INTRAMUSCULAR | Status: AC
  Administered 2014-11-27: 4 mg via INTRAVENOUS
  Filled 2014-11-27: qty 2

## 2014-11-27 MED ORDER — KETOROLAC TROMETHAMINE 30 MG/ML IJ SOLN
30.0000 mg | Freq: Once | INTRAMUSCULAR | Status: AC
  Administered 2014-11-27: 30 mg via INTRAVENOUS
  Filled 2014-11-27: qty 1

## 2014-11-27 MED ORDER — SODIUM CHLORIDE 0.9 % IV SOLN
Freq: Once | INTRAVENOUS | Status: AC
  Administered 2014-11-27: 09:00:00 via INTRAVENOUS

## 2014-11-27 MED ORDER — HYDROMORPHONE HCL 1 MG/ML IJ SOLN
1.0000 mg | Freq: Once | INTRAMUSCULAR | Status: AC
  Administered 2014-11-27: 1 mg via INTRAVENOUS
  Filled 2014-11-27: qty 1

## 2014-11-27 NOTE — ED Notes (Signed)
Pt c/o diarrhea yesterday morning x 1. Pt unable to have any other BM and felt he was constipated. Pt have himself an enema at 1600 yesterday. Pt denies fever and chills. Pt went to [facility] ER last night was diagnosed with UTI and given an antibiotic to take which he has not started yet. Pt denies nausea and vomiting. Pt reports constant lower abdominal cramping pain rated at 9/10.

## 2014-11-27 NOTE — ED Notes (Signed)
Pt complains of abdominal pain since yesterday AM. Pt states abdomen is tender and bloated. Diarrhea x1 yesterday morning. No bowel movements since then despite use of enema and laxative. Pt went to [facility] ED last night and was prescribed an antibiotic which he has not taken.

## 2014-11-27 NOTE — ED Provider Notes (Signed)
CSN: [id number]     Arrival date & time 11/27/14  0806 History   First MD Initiated Contact with Patient 11/27/14 0813     Chief Complaint  Patient presents with  . Abdominal Pain     (Consider location/radiation/quality/duration/timing/severity/associated sxs/prior Treatment) Patient is a 69 y.o. male presenting with abdominal pain. The history is provided by the patient. No language interpreter was used.  Abdominal Pain Pain location:  Suprapubic Pain quality: aching, cramping and squeezing   Pain radiates to:  Does not radiate Pain severity:  Moderate Onset quality:  Gradual Duration:  1 day Timing:  Constant Progression:  Worsening Chronicity:  New Context: laxative use   Relieved by:  Nothing Worsened by:  Nothing tried Ineffective treatments:  None tried Associated symptoms: dysuria   Risk factors: no recent hospitalization   Pt seen at [facility] earlier today for abdominal pain.  Pt was diagnosed with a uti.  Pt had a ct scan.  Pt reports he used 2 enemas and took a laxative at home.  Pt reports no relief.  Pt had diarrhea yesterday.   He has a history of c-diff and is concerned he could have again.  Pt has had a spinal  Problem and has urinary retention.  He currently has a foley.  He thinks foley may be causing the problem. Pt self cathed until recently. He is scheduled to have a superpubic cath done next week.    Past Medical History  Diagnosis Date  . Hyperlipidemia   . MRSA (methicillin resistant staph aureus) culture positive   . Hypertension   . Hypothyroidism   . Depression   . Liver disease   . Chronic hepatitis C   . Sleep apnea   . Obesity   . GERD (gastroesophageal reflux disease)   . BPH (benign prostatic hypertrophy)   . C. difficile diarrhea   . Arthritis   . Blood transfusion without reported diagnosis   . Substance abuse     pain medication   Past Surgical History  Procedure Laterality Date  . Spine surgery      due to mrsa  . Medial partial  knee replacement Right   . Lumbar epidural injection    . Chronic hep c    . C diff    . Lumbar disc surgery     Family History  Problem Relation Age of Onset  . Ovarian cancer Mother   . AAA (abdominal aortic aneurysm) Father   . Colon cancer Neg Hx   . Esophageal cancer Neg Hx   . Kidney disease Neg Hx   . Liver disease Neg Hx    Social History  Substance Use Topics  . Smoking status: Former Smoker    Quit date: 03/21/1994  . Smokeless tobacco: Never Used  . Alcohol Use: No    Review of Systems  Gastrointestinal: Positive for abdominal pain.  Genitourinary: Positive for dysuria.  All other systems reviewed and are negative.     Allergies  Hydrocodone  Home Medications   Prior to Admission medications   Medication Sig Start Date End Date Taking? Authorizing Provider  dicyclomine (BENTYL) 10 MG capsule Take 1 capsule (10 mg total) by mouth 4 (four) times daily -  before meals and at bedtime. 11/10/14  Yes Irene Shipper, MD  famotidine (PEPCID) 40 MG tablet take 1 tablet by mouth twice a day 11/05/14  Yes Amy S Esterwood, PA-C  glycopyrrolate (ROBINUL) 1 MG tablet Take 2 mg by mouth 3 (three)  times daily.   Yes Historical Provider, MD  hyoscyamine (OSCIMIN SR) 0.375 MG 12 hr tablet Take 0.375 mg by mouth 2 (two) times daily.   Yes Historical Provider, MD  Lactobacillus (RA ACIDOPHILUS) 300 MG CAPS Take 1 capsule by mouth 2 (two) times daily.   Yes Historical Provider, MD  levothyroxine (SYNTHROID, LEVOTHROID) 125 MCG tablet TAKE 1 TABLET BY MOUTH EVERY DAY NOTE DOSAGE CHANGE 07/22/14  Yes Chipper Herb, MD  omeprazole (PRILOSEC) 20 MG capsule Take 20 mg by mouth daily. 08/11/14  Yes Historical Provider, MD  pregabalin (LYRICA) 150 MG capsule Take 150 mg by mouth 2 (two) times daily.   Yes Historical Provider, MD  SUBOXONE 12-3 MG FILM Take 1 Film by mouth daily.  04/24/14  Yes Historical Provider, MD  tamsulosin (FLOMAX) 0.4 MG CAPS capsule Take 0.4 mg by mouth 2 (two) times  daily.  01/31/14  Yes Historical Provider, MD  Vitamin D, Ergocalciferol, (DRISDOL) 50000 UNITS CAPS capsule Take 50,000 Units by mouth every 7 (seven) days.  10/01/14  Yes Historical Provider, MD  glycopyrrolate (ROBINUL) 2 MG tablet Take 1 tablet (2 mg total) by mouth 3 (three) times daily. Patient not taking: Reported on 11/05/2014 10/20/14   Irene Shipper, MD  vancomycin (VANCOCIN) 50 mg/mL oral solution Take 10 mLs (500 mg total) by mouth every 6 (six) hours. Patient not taking: Reported on 11/27/2014 10/06/14   Fredia Sorrow, MD   BP 137/91 mmHg  Pulse 65  Temp(Src) 98.2 F (36.8 C) (Oral)  Resp 18  Ht 6\' 1"  (1.854 m)  Wt 260 lb (117.935 kg)  BMI 34.31 kg/m2  SpO2 96% Physical Exam  Constitutional: He is oriented to person, place, and time. He appears well-developed and well-nourished.  HENT:  Head: Normocephalic and atraumatic.  Right Ear: External ear normal.  Left Ear: External ear normal.  Nose: Nose normal.  Mouth/Throat: Oropharynx is clear and moist.  Eyes: Conjunctivae and EOM are normal. Pupils are equal, round, and reactive to light.  Neck: Normal range of motion.  Cardiovascular: Normal rate and normal heart sounds.   Pulmonary/Chest: Effort normal and breath sounds normal.  Abdominal: He exhibits no distension.  Musculoskeletal: Normal range of motion.  Neurological: He is alert and oriented to person, place, and time.  Skin: Skin is warm.  Psychiatric: He has a normal mood and affect.  Nursing note and vitals reviewed.   ED Course  Procedures (including critical care time) Labs Review Labs Reviewed  CBC WITH DIFFERENTIAL/PLATELET - Abnormal; Notable for the following:    RBC 3.95 (*)    Hemoglobin 11.3 (*)    HCT 33.1 (*)    All other components within normal limits  COMPREHENSIVE METABOLIC PANEL - Abnormal; Notable for the following:    Glucose, Bld 105 (*)    Calcium 8.8 (*)    AST 50 (*)    All other components within normal limits  LIPASE, BLOOD -  Abnormal; Notable for the following:    Lipase <10 (*)    All other components within normal limits  URINALYSIS, ROUTINE W REFLEX MICROSCOPIC (NOT AT [facility]) - Abnormal; Notable for the following:    pH 8.5 (*)    Leukocytes, UA TRACE (*)    All other components within normal limits  URINE MICROSCOPIC-ADD ON - Abnormal; Notable for the following:    Bacteria, UA FEW (*)    All other components within normal limits  GI PATHOGEN PANEL BY PCR, STOOL    Imaging  Review No results found.  Ct from [place] reviewed.  Ct was done this am.  Radiologist reports no intrabdominal pathology.    I have personally reviewed and evaluated these images and lab results as part of my medical decision-making.   EKG Interpretation None      MDM  Dr. Lacinda Axon in to see and examine.  Foley removed.  Pt reports no change. No relief with dilaudid.  Pt has a history of chronic abdominal pain.   I am not finding any acute problems,  I will send stool pathogen test.    Final diagnoses:  Abdominal pain, unspecified abdominal location    Pt advised to follow up with his urologist and gi doctor as scheduled.     Fransico Meadow, PA-C 11/27/14 [place], PA-C 11/27/14 [place], MD 11/27/14 1311

## 2014-11-27 NOTE — ED Notes (Signed)
PA at bedside.

## 2014-11-27 NOTE — Discharge Instructions (Signed)

## 2014-11-27 NOTE — ED Notes (Signed)
MD at bedside. 

## 2014-11-28 DIAGNOSIS — N319 Neuromuscular dysfunction of bladder, unspecified: Secondary | ICD-10-CM | POA: Diagnosis not present

## 2014-11-28 DIAGNOSIS — R102 Pelvic and perineal pain: Secondary | ICD-10-CM | POA: Diagnosis not present

## 2014-11-28 DIAGNOSIS — N3289 Other specified disorders of bladder: Secondary | ICD-10-CM | POA: Diagnosis not present

## 2014-11-29 ENCOUNTER — Encounter (HOSPITAL_COMMUNITY): Payer: Self-pay | Admitting: *Deleted

## 2014-11-29 ENCOUNTER — Emergency Department (HOSPITAL_COMMUNITY)
Admission: EM | Admit: 2014-11-29 | Discharge: 2014-11-29 | Disposition: A | Discharge status: Home / Self Care | Payer: Medicare Other | Attending: Emergency Medicine | Admitting: Emergency Medicine

## 2014-11-29 DIAGNOSIS — E039 Hypothyroidism, unspecified: Secondary | ICD-10-CM | POA: Diagnosis not present

## 2014-11-29 DIAGNOSIS — R1030 Lower abdominal pain, unspecified: Secondary | ICD-10-CM | POA: Diagnosis not present

## 2014-11-29 DIAGNOSIS — R301 Vesical tenesmus: Secondary | ICD-10-CM | POA: Insufficient documentation

## 2014-11-29 DIAGNOSIS — N3289 Other specified disorders of bladder: Secondary | ICD-10-CM | POA: Diagnosis not present

## 2014-11-29 DIAGNOSIS — I10 Essential (primary) hypertension: Secondary | ICD-10-CM | POA: Insufficient documentation

## 2014-11-29 DIAGNOSIS — N400 Enlarged prostate without lower urinary tract symptoms: Secondary | ICD-10-CM | POA: Insufficient documentation

## 2014-11-29 DIAGNOSIS — Z8659 Personal history of other mental and behavioral disorders: Secondary | ICD-10-CM | POA: Insufficient documentation

## 2014-11-29 DIAGNOSIS — Z8669 Personal history of other diseases of the nervous system and sense organs: Secondary | ICD-10-CM | POA: Insufficient documentation

## 2014-11-29 DIAGNOSIS — E669 Obesity, unspecified: Secondary | ICD-10-CM | POA: Diagnosis not present

## 2014-11-29 DIAGNOSIS — Z8739 Personal history of other diseases of the musculoskeletal system and connective tissue: Secondary | ICD-10-CM | POA: Insufficient documentation

## 2014-11-29 DIAGNOSIS — K219 Gastro-esophageal reflux disease without esophagitis: Secondary | ICD-10-CM | POA: Insufficient documentation

## 2014-11-29 DIAGNOSIS — Z8614 Personal history of Methicillin resistant Staphylococcus aureus infection: Secondary | ICD-10-CM | POA: Insufficient documentation

## 2014-11-29 DIAGNOSIS — Z8619 Personal history of other infectious and parasitic diseases: Secondary | ICD-10-CM | POA: Diagnosis not present

## 2014-11-29 DIAGNOSIS — R109 Unspecified abdominal pain: Secondary | ICD-10-CM | POA: Diagnosis present

## 2014-11-29 DIAGNOSIS — Z87891 Personal history of nicotine dependence: Secondary | ICD-10-CM | POA: Insufficient documentation

## 2014-11-29 DIAGNOSIS — Z79899 Other long term (current) drug therapy: Secondary | ICD-10-CM | POA: Insufficient documentation

## 2014-11-29 LAB — COMPREHENSIVE METABOLIC PANEL
ALK PHOS: 94 U/L (ref 38–126)
ALT: 60 U/L (ref 17–63)
ANION GAP: 6 (ref 5–15)
AST: 57 U/L — ABNORMAL HIGH (ref 15–41)
Albumin: 3.8 g/dL (ref 3.5–5.0)
BUN: 7 mg/dL (ref 6–20)
CALCIUM: 9.3 mg/dL (ref 8.9–10.3)
CO2: 27 mmol/L (ref 22–32)
CREATININE: 1.03 mg/dL (ref 0.61–1.24)
Chloride: 103 mmol/L (ref 101–111)
Glucose, Bld: 104 mg/dL — ABNORMAL HIGH (ref 65–99)
Potassium: 4.4 mmol/L (ref 3.5–5.1)
Sodium: 136 mmol/L (ref 135–145)
Total Bilirubin: 0.6 mg/dL (ref 0.3–1.2)
Total Protein: 6.7 g/dL (ref 6.5–8.1)

## 2014-11-29 LAB — GI PATHOGEN PANEL BY PCR, STOOL
C difficile toxin A/B: NOT DETECTED
CAMPYLOBACTER BY PCR: NOT DETECTED
Cryptosporidium by PCR: NOT DETECTED
E COLI (ETEC) LT/ST: NOT DETECTED
E COLI 0157 BY PCR: NOT DETECTED
E coli (STEC): NOT DETECTED
G LAMBLIA BY PCR: NOT DETECTED
NOROVIRUS G1/G2: NOT DETECTED
Rotavirus A by PCR: NOT DETECTED
SALMONELLA BY PCR: NOT DETECTED
SHIGELLA BY PCR: NOT DETECTED

## 2014-11-29 LAB — CBC
HCT: 36.8 % — ABNORMAL LOW (ref 39.0–52.0)
HEMOGLOBIN: 12.3 g/dL — AB (ref 13.0–17.0)
MCH: 28.3 pg (ref 26.0–34.0)
MCHC: 33.4 g/dL (ref 30.0–36.0)
MCV: 84.8 fL (ref 78.0–100.0)
PLATELETS: 221 10*3/uL (ref 150–400)
RBC: 4.34 MIL/uL (ref 4.22–5.81)
RDW: 13.3 % (ref 11.5–15.5)
WBC: 4.9 10*3/uL (ref 4.0–10.5)

## 2014-11-29 LAB — URINALYSIS, ROUTINE W REFLEX MICROSCOPIC
Bilirubin Urine: NEGATIVE
Glucose, UA: NEGATIVE mg/dL
Hgb urine dipstick: NEGATIVE
KETONES UR: NEGATIVE mg/dL
LEUKOCYTES UA: NEGATIVE
NITRITE: NEGATIVE
PROTEIN: NEGATIVE mg/dL
Specific Gravity, Urine: 1.005 (ref 1.005–1.030)
UROBILINOGEN UA: 0.2 mg/dL (ref 0.0–1.0)
pH: 7.5 (ref 5.0–8.0)

## 2014-11-29 LAB — LIPASE, BLOOD: LIPASE: 16 U/L — AB (ref 22–51)

## 2014-11-29 MED ORDER — HYOSCYAMINE SULFATE 0.125 MG PO TABS
0.1250 mg | ORAL_TABLET | Freq: Once | ORAL | Status: DC
  Filled 2014-11-29 (×2): qty 1

## 2014-11-29 MED ORDER — HYOSCYAMINE SULFATE 0.125 MG SL SUBL
0.1250 mg | SUBLINGUAL_TABLET | Freq: Once | SUBLINGUAL | Status: AC
  Administered 2014-11-29: 0.125 mg via SUBLINGUAL

## 2014-11-29 MED ORDER — HYOSCYAMINE SULFATE 0.125 MG SL SUBL: 0.1250 mg | SUBLINGUAL_TABLET | Freq: Four times a day (QID) | SUBLINGUAL | Status: DC | PRN

## 2014-11-29 NOTE — ED Notes (Signed)
Pt reports lower abd pain x 1 week. Pt self caths at home and treated with antibiotics x 2 days for possible UTI but no relief. Pt has hx of cdiff but reports last two samples have been negative. Had recent return of diarrhea but no bowel movement x 2 days. Denies n/v.

## 2014-11-29 NOTE — Discharge Instructions (Signed)
Foley Catheter Care °A Foley catheter is a soft, flexible tube that is placed into the bladder to drain urine. A Foley catheter may be inserted if: °· You leak urine or are not able to control when you urinate (urinary incontinence). °· You are not able to urinate when you need to (urinary retention). °· You had prostate surgery or surgery on the genitals. °· You have certain medical conditions, such as multiple sclerosis, dementia, or a spinal cord injury. °If you are going home with a Foley catheter in place, follow the instructions below. °TAKING CARE OF THE CATHETER °1. Wash your hands with soap and water. °2. Using mild soap and warm water on a clean washcloth: °· Clean the area on your body closest to the catheter insertion site using a circular motion, moving away from the catheter. Never wipe toward the catheter because this could sweep bacteria up into the urethra and cause infection. °· Remove all traces of soap. Pat the area dry with a clean towel. For males, reposition the foreskin. °3. Attach the catheter to your leg so there is no tension on the catheter. Use adhesive tape or a leg strap. If you are using adhesive tape, remove any sticky residue left behind by the previous tape you used. °4. Keep the drainage bag below the level of the bladder, but keep it off the floor. °5. Check throughout the day to be sure the catheter is working and urine is draining freely. Make sure the tubing does not become kinked. °6. Do not pull on the catheter or try to remove it. Pulling could damage internal tissues. °TAKING CARE OF THE DRAINAGE BAGS °You will be given two drainage bags to take home. One is a large overnight drainage bag, and the other is a smaller leg bag that fits underneath clothing. You may wear the overnight bag at any time, but you should never wear the smaller leg bag at night. Follow the instructions below for how to empty, change, and clean your drainage bags. °Emptying the Drainage Bag °You must  empty your drainage bag when it is  -½ full or at least 2-3 times a day. °1. Wash your hands with soap and water. °2. Keep the drainage bag below your hips, below the level of your bladder. This stops urine from going back into the tubing and into your bladder. °3. Hold the dirty bag over the toilet or a clean container. °4. Open the pour spout at the bottom of the bag and empty the urine into the toilet or container. Do not let the pour spout touch the toilet, container, or any other surface. Doing so can place bacteria on the bag, which can cause an infection. °5. Clean the pour spout with a gauze pad or cotton ball that has rubbing alcohol on it. °6. Close the pour spout. °7. Attach the bag to your leg with adhesive tape or a leg strap. °8. Wash your hands well. °Changing the Drainage Bag °Change your drainage bag once a month or sooner if it starts to smell bad or look dirty. Below are steps to follow when changing the drainage bag. °1. Wash your hands with soap and water. °2. Pinch off the rubber catheter so that urine does not spill out. °3. Disconnect the catheter tube from the drainage tube at the connection valve. Do not let the tubes touch any surface. °4. Clean the end of the catheter tube with an alcohol wipe. Use a different alcohol wipe to clean the   end of the drainage tube. °5. Connect the catheter tube to the drainage tube of the clean drainage bag. °6. Attach the new bag to the leg with adhesive tape or a leg strap. Avoid attaching the new bag too tightly. °7. Wash your hands well. °Cleaning the Drainage Bag °1. Wash your hands with soap and water. °2. Wash the bag in warm, soapy water. °3. Rinse the bag thoroughly with warm water. °4. Fill the bag with a solution of white vinegar and water (1 cup vinegar to 1 qt warm water [.2 L vinegar to 1 L warm water]). Close the bag and soak it for 30 minutes in the solution. °5. Rinse the bag with warm water. °6. Hang the bag to dry with the pour spout open  and hanging downward. °7. Store the clean bag (once it is dry) in a clean plastic bag. °8. Wash your hands well. °PREVENTING INFECTION °· Wash your hands before and after handling your catheter. °· Take showers daily and wash the area where the catheter enters your body. Do not take baths. Replace wet leg straps with dry ones, if this applies. °· Do not use powders, sprays, or lotions on the genital area. Only use creams, lotions, or ointments as directed by your caregiver. °· For females, wipe from front to back after each bowel movement. °· Drink enough fluids to keep your urine clear or pale yellow unless you have a fluid restriction. °· Do not let the drainage bag or tubing touch or lie on the floor. °· Wear cotton underwear to absorb moisture and to keep your skin drier. °SEEK MEDICAL CARE IF:  °· Your urine is cloudy or smells unusually bad. °· Your catheter becomes clogged. °· You are not draining urine into the bag or your bladder feels full. °· Your catheter starts to leak. °SEEK IMMEDIATE MEDICAL CARE IF:  °· You have pain, swelling, redness, or pus where the catheter enters the body. °· You have pain in the abdomen, legs, lower back, or bladder. °· You have a fever. °· You see blood fill the catheter, or your urine is pink or red. °· You have nausea, vomiting, or chills. °· Your catheter gets pulled out. °MAKE SURE YOU:  °· Understand these instructions. °· Will watch your condition. °· Will get help right away if you are not doing well or get worse. °Document Released: 03/07/2005 Document Revised: 07/22/2013 Document Reviewed: 02/27/2012 °ExitCare® Patient Information ©2015 [employer]. This information is not intended to replace advice given to you by your health care provider. Make sure you discuss any questions you have with your health care provider. ° °

## 2014-11-29 NOTE — ED Provider Notes (Signed)
CSN: [id number]     Arrival date & time 11/29/14  1129 History   First MD Initiated Contact with Patient 11/29/14 1645     Chief Complaint  Patient presents with  . Abdominal Pain    Patient is a 69 y.o. male presenting with abdominal pain. The history is provided by the patient. No language interpreter was used.  Abdominal Pain  Mr. Remedios presents for evaluation of lower abdominal pain. He reports 3 days of lower abdominal pain and cramping that started after removal of his indwelling Foley catheter. Since it's been removed he has been in and out caffeine every 2 hours. About 2 hours after he self cath he feels like he needs to urinate again. He is getting about 100 mL at a time. There is no change in the quality of his urine. He denies any fevers, vomiting. He has a history of chronic back pain. This is his third ED visit in the last few days for this pain. He has been seen in [place] and had a CT scan.  He has a history of C. difficile and had diarrhea 3 days ago, none since then. He is scheduled to get a suprapubic catheter placed by Dr. Valetta Close in [place] in 4 days. He feels that a Foley catheter may alleviate his symptoms at this time.  Past Medical History  Diagnosis Date  . Hyperlipidemia   . MRSA (methicillin resistant staph aureus) culture positive   . Hypertension   . Hypothyroidism   . Depression   . Liver disease   . Chronic hepatitis C   . Sleep apnea   . Obesity   . GERD (gastroesophageal reflux disease)   . BPH (benign prostatic hypertrophy)   . C. difficile diarrhea   . Arthritis   . Blood transfusion without reported diagnosis   . Substance abuse     pain medication   Past Surgical History  Procedure Laterality Date  . Spine surgery      due to mrsa  . Medial partial knee replacement Right   . Lumbar epidural injection    . Chronic hep c    . C diff    . Lumbar disc surgery     Family History  Problem Relation Age of Onset  . Ovarian cancer Mother   . AAA  (abdominal aortic aneurysm) Father   . Colon cancer Neg Hx   . Esophageal cancer Neg Hx   . Kidney disease Neg Hx   . Liver disease Neg Hx    Social History  Substance Use Topics  . Smoking status: Former Smoker    Quit date: 03/21/1994  . Smokeless tobacco: Never Used  . Alcohol Use: No    Review of Systems  Gastrointestinal: Positive for abdominal pain.  All other systems reviewed and are negative.     Allergies  Hydrocodone  Home Medications   Prior to Admission medications   Medication Sig Start Date End Date Taking? Authorizing Provider  dicyclomine (BENTYL) 10 MG capsule Take 1 capsule (10 mg total) by mouth 4 (four) times daily -  before meals and at bedtime. 11/10/14   Irene Shipper, MD  famotidine (PEPCID) 40 MG tablet take 1 tablet by mouth twice a day 11/05/14   Amy S Esterwood, PA-C  glycopyrrolate (ROBINUL) 1 MG tablet Take 2 mg by mouth 3 (three) times daily.    Historical Provider, MD  glycopyrrolate (ROBINUL) 2 MG tablet Take 1 tablet (2 mg total) by mouth 3 (three)  times daily. Patient not taking: Reported on 11/05/2014 10/20/14   Irene Shipper, MD  hyoscyamine (OSCIMIN SR) 0.375 MG 12 hr tablet Take 0.375 mg by mouth 2 (two) times daily.    Historical Provider, MD  Lactobacillus (RA ACIDOPHILUS) 300 MG CAPS Take 1 capsule by mouth 2 (two) times daily.    Historical Provider, MD  levothyroxine (SYNTHROID, LEVOTHROID) 125 MCG tablet TAKE 1 TABLET BY MOUTH EVERY DAY NOTE DOSAGE CHANGE 07/22/14   Chipper Herb, MD  omeprazole (PRILOSEC) 20 MG capsule Take 20 mg by mouth daily. 08/11/14   Historical Provider, MD  pregabalin (LYRICA) 150 MG capsule Take 150 mg by mouth 2 (two) times daily.    Historical Provider, MD  SUBOXONE 12-3 MG FILM Take 1 Film by mouth daily.  04/24/14   Historical Provider, MD  tamsulosin (FLOMAX) 0.4 MG CAPS capsule Take 0.4 mg by mouth 2 (two) times daily.  01/31/14   Historical Provider, MD  vancomycin (VANCOCIN) 50 mg/mL oral solution Take 10 mLs  (500 mg total) by mouth every 6 (six) hours. Patient not taking: Reported on 11/27/2014 10/06/14   Fredia Sorrow, MD  Vitamin D, Ergocalciferol, (DRISDOL) 50000 UNITS CAPS capsule Take 50,000 Units by mouth every 7 (seven) days.  10/01/14   Historical Provider, MD   BP 152/78 mmHg  Pulse 58  Temp(Src) 98.3 F (36.8 C) (Oral)  Resp 20  Ht 6\' 1"  (1.854 m)  Wt 246 lb 8 oz (111.812 kg)  BMI 32.53 kg/m2  SpO2 99% Physical Exam  Constitutional: He is oriented to person, place, and time. He appears well-developed and well-nourished.  HENT:  Head: Normocephalic and atraumatic.  Cardiovascular: Normal rate and regular rhythm.   No murmur heard. Pulmonary/Chest: Effort normal and breath sounds normal. No respiratory distress.  Abdominal: Soft. There is no tenderness. There is no rebound and no guarding.  Musculoskeletal: He exhibits no edema or tenderness.  Neurological: He is alert and oriented to person, place, and time.  Skin: Skin is warm and dry.  Psychiatric: He has a normal mood and affect. His behavior is normal.  Nursing note and vitals reviewed.   ED Course  Procedures (including critical care time) Labs Review Labs Reviewed  LIPASE, BLOOD - Abnormal; Notable for the following:    Lipase 16 (*)    All other components within normal limits  COMPREHENSIVE METABOLIC PANEL - Abnormal; Notable for the following:    Glucose, Bld 104 (*)    AST 57 (*)    All other components within normal limits  CBC - Abnormal; Notable for the following:    Hemoglobin 12.3 (*)    HCT 36.8 (*)    All other components within normal limits  URINALYSIS, ROUTINE W REFLEX MICROSCOPIC (NOT AT [facility]) - Abnormal; Notable for the following:    APPearance CLOUDY (*)    All other components within normal limits  URINE CULTURE    Imaging Review No results found. I have personally reviewed and evaluated these images and lab results as part of my medical decision-making.   EKG Interpretation None       MDM   Final diagnoses:  Bladder spasms    Pt with hx/o foley catheter that now performs in and out caths with pain since foley catheter has been discontinued.  Patient's lower abdominal pain resolved after levsin and foley catheter placement.  Discussed Urology follow up, return precautions.  UA not c/w UTI, will only treat if cultures are positive.  Quintella Reichert, MD 11/29/14 2245

## 2014-12-01 ENCOUNTER — Telehealth: Payer: Self-pay | Admitting: *Deleted

## 2014-12-01 LAB — URINE CULTURE: CULTURE: NO GROWTH

## 2014-12-01 NOTE — Telephone Encounter (Signed)
Patient states that he is feeling some better and he is having surgery on Wednesday.

## 2014-12-03 DIAGNOSIS — R102 Pelvic and perineal pain: Secondary | ICD-10-CM | POA: Diagnosis not present

## 2014-12-03 DIAGNOSIS — E039 Hypothyroidism, unspecified: Secondary | ICD-10-CM | POA: Diagnosis not present

## 2014-12-03 DIAGNOSIS — G4730 Sleep apnea, unspecified: Secondary | ICD-10-CM | POA: Diagnosis not present

## 2014-12-03 DIAGNOSIS — F329 Major depressive disorder, single episode, unspecified: Secondary | ICD-10-CM | POA: Diagnosis not present

## 2014-12-03 DIAGNOSIS — N3289 Other specified disorders of bladder: Secondary | ICD-10-CM | POA: Diagnosis not present

## 2014-12-03 DIAGNOSIS — F419 Anxiety disorder, unspecified: Secondary | ICD-10-CM | POA: Diagnosis not present

## 2014-12-03 DIAGNOSIS — M1990 Unspecified osteoarthritis, unspecified site: Secondary | ICD-10-CM | POA: Diagnosis not present

## 2014-12-03 DIAGNOSIS — R339 Retention of urine, unspecified: Secondary | ICD-10-CM | POA: Diagnosis not present

## 2014-12-03 DIAGNOSIS — G35 Multiple sclerosis: Secondary | ICD-10-CM | POA: Diagnosis not present

## 2014-12-03 DIAGNOSIS — I10 Essential (primary) hypertension: Secondary | ICD-10-CM | POA: Diagnosis not present

## 2014-12-03 DIAGNOSIS — Z880 Allergy status to penicillin: Secondary | ICD-10-CM | POA: Diagnosis not present

## 2014-12-03 DIAGNOSIS — E669 Obesity, unspecified: Secondary | ICD-10-CM | POA: Diagnosis not present

## 2014-12-03 DIAGNOSIS — Z885 Allergy status to narcotic agent status: Secondary | ICD-10-CM | POA: Diagnosis not present

## 2014-12-03 DIAGNOSIS — N401 Enlarged prostate with lower urinary tract symptoms: Secondary | ICD-10-CM | POA: Diagnosis not present

## 2014-12-03 DIAGNOSIS — Z79899 Other long term (current) drug therapy: Secondary | ICD-10-CM | POA: Diagnosis not present

## 2014-12-03 DIAGNOSIS — N319 Neuromuscular dysfunction of bladder, unspecified: Secondary | ICD-10-CM | POA: Diagnosis not present

## 2014-12-19 ENCOUNTER — Ambulatory Visit (INDEPENDENT_AMBULATORY_CARE_PROVIDER_SITE_OTHER): Payer: Medicare Other | Admitting: Physician Assistant

## 2014-12-19 ENCOUNTER — Encounter: Payer: Self-pay | Admitting: Physician Assistant

## 2014-12-19 VITALS — BP 118/82 | HR 68 | Temp 98.0°F | Ht 73.0 in | Wt 260.0 lb

## 2014-12-19 DIAGNOSIS — R1030 Lower abdominal pain, unspecified: Secondary | ICD-10-CM

## 2014-12-19 DIAGNOSIS — IMO0001 Reserved for inherently not codable concepts without codable children: Secondary | ICD-10-CM

## 2014-12-19 DIAGNOSIS — Z8719 Personal history of other diseases of the digestive system: Secondary | ICD-10-CM

## 2014-12-19 DIAGNOSIS — N3289 Other specified disorders of bladder: Secondary | ICD-10-CM

## 2014-12-19 MED ORDER — OXYBUTYNIN CHLORIDE 5 MG PO TABS: 5.0000 mg | ORAL_TABLET | Freq: Three times a day (TID) | ORAL | Status: DC

## 2014-12-19 NOTE — Progress Notes (Signed)
   Subjective:    Patient ID: Kenneth Rhodes, male    DOB: [birth year], 69 y.o.   MRN: [id number]  HPI 69 y/o male presents with c/o nausea, abdominal cramping and diarrhea x 3 days. Has tried pepto bismol with mild relief.   He has h/o bladder spasms (had catheter installed 2 weeks ago), c diff infection, mrsa infection, hepatitis C. He has a folllow up for Hep C this coming Wednesday and is starting some type of treatment but he is unsure.     Review of Systems  Constitutional: Positive for chills and fatigue.  HENT: Negative.   Eyes: Negative.   Respiratory: Negative.   Cardiovascular: Negative.   Gastrointestinal: Positive for nausea, vomiting, abdominal pain and diarrhea. Negative for constipation and blood in stool.  Endocrine: Negative.   Genitourinary: Negative.   Musculoskeletal: Negative.   Skin: Negative.   Neurological: Negative.   Psychiatric/Behavioral: Negative.        Objective:   Physical Exam  Constitutional: He appears well-developed and well-nourished. No distress.  Abdominal: Soft.  Suprapubic catheter in place   Neurological: He is alert.  Skin: He is not diaphoretic.  Psychiatric: He has a normal mood and affect. His behavior is normal. Judgment and thought content normal.  Nursing note and vitals reviewed.         Assessment & Plan:  1. Lower abdominal pain  - CBC with Differential/Platelet - CMP14+EGFR - Stool culture - oxybutynin (DITROPAN) 5 MG tablet; Take 1 tablet (5 mg total) by mouth 3 (three) times daily.  Dispense: 60 tablet; Refill: 0  Likely due to suprapubic catheter placed 2 weeks ago. Patient also has appt with urology today.   2. History of Clostridium difficile  - CBC with Differential/Platelet - CMP14+EGFR - Stool culture  3. Bladder spasms  - oxybutynin (DITROPAN) 5 MG tablet; Take 1 tablet (5 mg total) by mouth 3 (three) times daily.  Dispense: 60 tablet; Refill: 0     Tiffany A. Benjamin Stain PA-C

## 2014-12-20 ENCOUNTER — Other Ambulatory Visit: Payer: Medicare Other

## 2014-12-20 DIAGNOSIS — R1030 Lower abdominal pain, unspecified: Secondary | ICD-10-CM | POA: Diagnosis not present

## 2014-12-20 DIAGNOSIS — Z8719 Personal history of other diseases of the digestive system: Secondary | ICD-10-CM | POA: Diagnosis not present

## 2014-12-20 DIAGNOSIS — IMO0001 Reserved for inherently not codable concepts without codable children: Secondary | ICD-10-CM

## 2014-12-20 LAB — CMP14+EGFR
ALT: 72 IU/L — AB (ref 0–44)
AST: 75 IU/L — AB (ref 0–40)
Albumin/Globulin Ratio: 1.7 (ref 1.1–2.5)
Albumin: 4.1 g/dL (ref 3.6–4.8)
Alkaline Phosphatase: 102 IU/L (ref 39–117)
BUN/Creatinine Ratio: 11 (ref 10–22)
BUN: 10 mg/dL (ref 8–27)
Bilirubin Total: 0.4 mg/dL (ref 0.0–1.2)
CALCIUM: 9.6 mg/dL (ref 8.6–10.2)
CO2: 26 mmol/L (ref 18–29)
CREATININE: 0.94 mg/dL (ref 0.76–1.27)
Chloride: 99 mmol/L (ref 97–108)
GFR calc Af Amer: 97 mL/min/{1.73_m2} (ref >59)
GFR, EST NON AFRICAN AMERICAN: 84 mL/min/{1.73_m2} (ref >59)
Globulin, Total: 2.4 g/dL (ref 1.5–4.5)
Glucose: 100 mg/dL — ABNORMAL HIGH (ref 65–99)
POTASSIUM: 5.3 mmol/L — AB (ref 3.5–5.2)
Sodium: 140 mmol/L (ref 134–144)
Total Protein: 6.5 g/dL (ref 6.0–8.5)

## 2014-12-20 LAB — CBC WITH DIFFERENTIAL/PLATELET
BASOS: 1 %
Basophils Absolute: 0 10*3/uL (ref 0.0–0.2)
EOS (ABSOLUTE): 0.1 10*3/uL (ref 0.0–0.4)
EOS: 2 %
Hematocrit: 35.7 % — ABNORMAL LOW (ref 37.5–51.0)
Hemoglobin: 12.2 g/dL — ABNORMAL LOW (ref 12.6–17.7)
IMMATURE GRANS (ABS): 0 10*3/uL (ref 0.0–0.1)
IMMATURE GRANULOCYTES: 0 %
LYMPHS: 16 %
Lymphocytes Absolute: 0.8 10*3/uL (ref 0.7–3.1)
MCH: 28.4 pg (ref 26.6–33.0)
MCHC: 34.2 g/dL (ref 31.5–35.7)
MCV: 83 fL (ref 79–97)
Monocytes Absolute: 0.5 10*3/uL (ref 0.1–0.9)
Monocytes: 11 %
NEUTROS PCT: 70 %
Neutrophils Absolute: 3.5 10*3/uL (ref 1.4–7.0)
PLATELETS: 227 10*3/uL (ref 150–379)
RBC: 4.3 x10E6/uL (ref 4.14–5.80)
RDW: 13.6 % (ref 12.3–15.4)
WBC: 5 10*3/uL (ref 3.4–10.8)

## 2014-12-20 NOTE — Addendum Note (Signed)
Addended by: Marline Backbone A on: 12/20/2014 09:36 AM   Modules accepted: Orders

## 2014-12-22 DIAGNOSIS — N3289 Other specified disorders of bladder: Secondary | ICD-10-CM | POA: Diagnosis not present

## 2014-12-22 DIAGNOSIS — Z5189 Encounter for other specified aftercare: Secondary | ICD-10-CM | POA: Diagnosis not present

## 2014-12-22 DIAGNOSIS — K5901 Slow transit constipation: Secondary | ICD-10-CM | POA: Diagnosis not present

## 2014-12-22 LAB — CLOSTRIDIUM DIFFICILE BY PCR: Toxigenic C. Difficile by PCR: NEGATIVE

## 2014-12-23 DIAGNOSIS — K740 Hepatic fibrosis: Secondary | ICD-10-CM | POA: Diagnosis not present

## 2014-12-23 DIAGNOSIS — B182 Chronic viral hepatitis C: Secondary | ICD-10-CM | POA: Diagnosis not present

## 2014-12-23 DIAGNOSIS — N319 Neuromuscular dysfunction of bladder, unspecified: Secondary | ICD-10-CM | POA: Diagnosis not present

## 2014-12-23 DIAGNOSIS — T839XXA Unspecified complication of genitourinary prosthetic device, implant and graft, initial encounter: Secondary | ICD-10-CM | POA: Diagnosis not present

## 2014-12-24 LAB — OVA AND PARASITE EXAMINATION

## 2014-12-25 ENCOUNTER — Encounter: Payer: Self-pay | Admitting: Internal Medicine

## 2014-12-25 ENCOUNTER — Ambulatory Visit (INDEPENDENT_AMBULATORY_CARE_PROVIDER_SITE_OTHER): Payer: Medicare Other | Admitting: Internal Medicine

## 2014-12-25 VITALS — BP 160/98 | HR 78 | Ht 73.0 in | Wt 263.0 lb

## 2014-12-25 DIAGNOSIS — K219 Gastro-esophageal reflux disease without esophagitis: Secondary | ICD-10-CM

## 2014-12-25 DIAGNOSIS — R1030 Lower abdominal pain, unspecified: Secondary | ICD-10-CM | POA: Diagnosis not present

## 2014-12-25 DIAGNOSIS — B1710 Acute hepatitis C without hepatic coma: Secondary | ICD-10-CM

## 2014-12-25 DIAGNOSIS — R197 Diarrhea, unspecified: Secondary | ICD-10-CM | POA: Diagnosis not present

## 2014-12-25 LAB — STOOL CULTURE: E coli, Shiga toxin Assay: NEGATIVE

## 2014-12-25 NOTE — Patient Instructions (Signed)
Follow up with Dr Henrene Pastor as needed.   I appreciate the opportunity to care for you.

## 2014-12-25 NOTE — Progress Notes (Signed)
HISTORY OF PRESENT ILLNESS:  Kenneth Rhodes is a 69 y.o. male presents for routine follow-up regarding his abdominal pain and diarrhea. He has a history of MRSA infection of the spine. Subsequent Clostridium deficit related diarrhea which has been successfully treated. Seen in this office repeatedly for complaints of diarrhea and severe lower abdominal pain. Multiple repeat stool studies have been negative for Clostridium difficile. His [place] pain was felt to be secondary to bladder spasm from chronic indwelling Foley catheter. 3 weeks ago his urologist put in a suprapubic catheter. His lower abdominal pain has resolved. Upon close questioning, the patient really does not have diarrhea. He states he has difficulty with bowel movements daily. For this he gives himself an enema once daily. This is loose. He'll be has one bowel movement per day. No bleeding. Has had colonoscopy elsewhere without polyps less than 10 years ago. No new complaints. Continues on omeprazole for GERD with good control. He does have hepatitis C for which she is being evaluated [facility]  REVIEW OF SYSTEMS:  All non-GI ROS negative except for anxiety, arthritis, back pain, depression, fatigue, headaches, sleeping problems, ankle edema, urinary frequency, urinary leakage  Past Medical History  Diagnosis Date  . Hyperlipidemia   . MRSA (methicillin resistant staph aureus) culture positive   . Hypertension   . Hypothyroidism   . Depression   . Liver disease   . Chronic hepatitis C ([place])   . Sleep apnea   . Obesity   . GERD (gastroesophageal reflux disease)   . BPH (benign prostatic hypertrophy)   . C. difficile diarrhea   . Arthritis   . Blood transfusion without reported diagnosis   . Substance abuse     pain medication  . Hepatitis C     Past Surgical History  Procedure Laterality Date  . Spine surgery      due to mrsa  . Medial partial knee replacement Right   . Lumbar epidural injection     . Lumbar disc surgery      Social History Kenneth Rhodes  reports that he quit smoking about 20 years ago. He has never used smokeless tobacco. He reports that he does not drink alcohol or use illicit drugs.  family history includes AAA (abdominal aortic aneurysm) in his father; Colon cancer in his mother; Ovarian cancer in his mother. There is no history of Esophageal cancer, Kidney disease, or Liver disease.  Allergies  Allergen Reactions  . Hydrocodone Nausea Only       PHYSICAL EXAMINATION: Vital signs: BP 160/98 mmHg  Pulse 78  Ht 6\' 1"  (1.854 m)  Wt 263 lb (119.296 kg)  BMI 34.71 kg/m2 General: Well-developed, well-nourished, no acute distress HEENT: Sclerae are anicteric, conjunctiva pink. Oral mucosa intact Lungs: Clear Heart: Regular Abdomen: soft, nontender, nondistended, no obvious ascites, no peritoneal signs, normal bowel sounds. No organomegaly. Suprapubic catheter in place Extremities: No clubbing cyanosis or edema Psychiatric: alert and oriented x3. Cooperative   ASSESSMENT:  #1. Problems with lower abdominal pain secondary to bladder spasm. Resolved after suprapubic catheter placed in lieu of Foley catheter #2. History of Clostridium difficile. Clinically resolved #3. Reports of "diarrhea". Not really diarrhea. Requires enema each morning for a bowel movement. This is loose #4. GERD. Controlled with PPI #5. History of hepatitis C. Being followed at the liver clinic   PLAN:  #1. Continue follow-up with your urologist and the liver clinic #2. Continue PPI for management of GERD #3. Return to  the care of your primary care provider #4. GI follow-up as needed

## 2014-12-29 ENCOUNTER — Encounter: Payer: Self-pay | Admitting: Family

## 2014-12-29 ENCOUNTER — Ambulatory Visit (INDEPENDENT_AMBULATORY_CARE_PROVIDER_SITE_OTHER): Payer: Medicare Other | Admitting: Family

## 2014-12-29 VITALS — BP 141/90 | HR 74 | Temp 98.6°F | Ht 73.0 in | Wt 261.4 lb

## 2014-12-29 DIAGNOSIS — R208 Other disturbances of skin sensation: Secondary | ICD-10-CM | POA: Diagnosis not present

## 2014-12-29 DIAGNOSIS — G8929 Other chronic pain: Secondary | ICD-10-CM | POA: Diagnosis not present

## 2014-12-29 DIAGNOSIS — M5416 Radiculopathy, lumbar region: Secondary | ICD-10-CM | POA: Diagnosis not present

## 2014-12-29 DIAGNOSIS — R200 Anesthesia of skin: Secondary | ICD-10-CM

## 2014-12-29 NOTE — Patient Instructions (Signed)
Fall Prevention in the Home  Falls can cause injuries and can affect people from all age groups. There are many simple things that you can do to make your home safe and to help prevent falls. WHAT CAN I DO ON THE OUTSIDE OF MY HOME?  Regularly repair the edges of walkways and driveways and fix any cracks.  Remove high doorway thresholds.  Trim any shrubbery on the main path into your home.  Use bright outdoor lighting.  Clear walkways of debris and clutter, including tools and rocks.  Regularly check that handrails are securely fastened and in good repair. Both sides of any steps should have handrails.  Install guardrails along the edges of any raised decks or porches.  Have leaves, snow, and ice cleared regularly.  Use sand or salt on walkways during winter months.  In the garage, clean up any spills right away, including grease or oil spills. WHAT CAN I DO IN THE BATHROOM?  Use night lights.  Install grab bars by the toilet and in the tub and shower. Do not use towel bars as grab bars.  Use non-skid mats or decals on the floor of the tub or shower.  If you need to sit down while you are in the shower, use a plastic, non-slip stool..  Keep the floor dry. Immediately clean up any water that spills on the floor.  Remove soap buildup in the tub or shower on a regular basis.  Attach bath mats securely with double-sided non-slip rug tape.  Remove throw rugs and other tripping hazards from the floor. WHAT CAN I DO IN THE BEDROOM?  Use night lights.  Make sure that a bedside light is easy to reach.  Do not use oversized bedding that drapes onto the floor.  Have a firm chair that has side arms to use for getting dressed.  Remove throw rugs and other tripping hazards from the floor. WHAT CAN I DO IN THE KITCHEN?   Clean up any spills right away.  Avoid walking on wet floors.  Place frequently used items in easy-to-reach places.  If you need to reach for something  above you, use a sturdy step stool that has a grab bar.  Keep electrical cables out of the way.  Do not use floor polish or wax that makes floors slippery. If you have to use wax, make sure that it is non-skid floor wax.  Remove throw rugs and other tripping hazards from the floor. WHAT CAN I DO IN THE STAIRWAYS?  Do not leave any items on the stairs.  Make sure that there are handrails on both sides of the stairs. Fix handrails that are broken or loose. Make sure that handrails are as long as the stairways.  Check any carpeting to make sure that it is firmly attached to the stairs. Fix any carpet that is loose or worn.  Avoid having throw rugs at the top or bottom of stairways, or secure the rugs with carpet tape to prevent them from moving.  Make sure that you have a light switch at the top of the stairs and the bottom of the stairs. If you do not have them, have them installed. WHAT ARE SOME OTHER FALL PREVENTION TIPS?  Wear closed-toe shoes that fit well and support your feet. Wear shoes that have rubber soles or low heels.  When you use a stepladder, make sure that it is completely opened and that the sides are firmly locked. Have someone hold the ladder while you   are using it. Do not climb a closed stepladder.  Add color or contrast paint or tape to grab bars and handrails in your home. Place contrasting color strips on the first and last steps.  Use mobility aids as needed, such as canes, walkers, scooters, and crutches.  Turn on lights if it is dark. Replace any light bulbs that burn out.  Set up furniture so that there are clear paths. Keep the furniture in the same spot.  Fix any uneven floor surfaces.  Choose a carpet design that does not hide the edge of steps of a stairway.  Be aware of any and all pets.  Review your medicines with your healthcare provider. Some medicines can cause dizziness or changes in blood pressure, which increase your risk of falling. Talk  with your health care provider about other ways that you can decrease your risk of falls. This may include working with a physical therapist or trainer to improve your strength, balance, and endurance.   This information is not intended to replace advice given to you by your health care provider. Make sure you discuss any questions you have with your health care provider.   Document Released: 02/25/2002 Document Revised: 07/22/2014 Document Reviewed: 04/11/2014 [employer] Interactive Patient Education 2016 [employer]  

## 2014-12-29 NOTE — Progress Notes (Signed)
   Subjective:    Patient ID: Kenneth Rhodes, male    DOB: 19-Mar-1946, 69 y.o.   MRN: [id number]  HPI Pt presents to the office today for chronic bilaterally leg numbness. Pt states a year ago he was treated for "MRSA" and was in the hospital for 8 weeks. Pt states he has never recovered from incident. Pt states he feels like he is going to fall when he is walking. Pt states he feels like he is "stumbling". Pt ambulates with a cane.    Review of Systems  Constitutional: Negative.   HENT: Negative.   Respiratory: Negative.   Cardiovascular: Negative.   Gastrointestinal: Negative.   Endocrine: Negative.   Genitourinary: Negative.   Musculoskeletal: Negative.   Neurological: Negative.   Hematological: Negative.   Psychiatric/Behavioral: Negative.   All other systems reviewed and are negative.      Objective:   Physical Exam  Constitutional: He is oriented to person, place, and time. He appears well-developed and well-nourished. No distress.  HENT:  Head: Normocephalic.  Neck: Normal range of motion. Neck supple. No thyromegaly present.  Cardiovascular: Normal rate, regular rhythm, normal heart sounds and intact distal pulses.   No murmur heard. Pulmonary/Chest: Effort normal and breath sounds normal. No respiratory distress. He has no wheezes.  Abdominal: Soft. Bowel sounds are normal. He exhibits no distension. There is no tenderness.  Musculoskeletal: Normal range of motion. He exhibits no edema or tenderness.  Pt unable to walk heel to toe, pt has poor posture, pt unable to balance on just left foot. Falls risks  Neurological: He is alert and oriented to person, place, and time. He has normal reflexes. No cranial nerve deficit.  Skin: Skin is warm and dry. No rash noted. No erythema.  Psychiatric: He has a normal mood and affect. His behavior is normal. Judgment and thought content normal.  Vitals reviewed.   BP 141/90 mmHg  Pulse 74  Temp(Src) 98.6 F (37 C) (Oral)   Ht 6\' 1"  (1.854 m)  Wt 261 lb 6.4 oz (118.57 kg)  BMI 34.49 kg/m2       Assessment & Plan:  1. Lumbar radiculopathy, chronic - Ambulatory referral to Physical Therapy  2. Chronic pain - Ambulatory referral to Physical Therapy  3. Bilateral leg numbness - Ambulatory referral to Physical Therapy   Falls risks discussed Exercises encouraged Physical Therapy pending RTO prn and keep chronic follow up  Evelina Dun, FNP

## 2014-12-30 DIAGNOSIS — F419 Anxiety disorder, unspecified: Secondary | ICD-10-CM | POA: Diagnosis not present

## 2014-12-30 DIAGNOSIS — Z8619 Personal history of other infectious and parasitic diseases: Secondary | ICD-10-CM | POA: Diagnosis not present

## 2014-12-30 DIAGNOSIS — R1030 Lower abdominal pain, unspecified: Secondary | ICD-10-CM | POA: Diagnosis not present

## 2014-12-30 DIAGNOSIS — Z79899 Other long term (current) drug therapy: Secondary | ICD-10-CM | POA: Diagnosis not present

## 2014-12-30 DIAGNOSIS — E039 Hypothyroidism, unspecified: Secondary | ICD-10-CM | POA: Diagnosis not present

## 2014-12-30 DIAGNOSIS — N3000 Acute cystitis without hematuria: Secondary | ICD-10-CM | POA: Diagnosis not present

## 2014-12-30 DIAGNOSIS — I10 Essential (primary) hypertension: Secondary | ICD-10-CM | POA: Diagnosis not present

## 2014-12-30 DIAGNOSIS — F329 Major depressive disorder, single episode, unspecified: Secondary | ICD-10-CM | POA: Diagnosis not present

## 2014-12-30 DIAGNOSIS — R109 Unspecified abdominal pain: Secondary | ICD-10-CM | POA: Diagnosis not present

## 2014-12-30 DIAGNOSIS — M549 Dorsalgia, unspecified: Secondary | ICD-10-CM | POA: Diagnosis not present

## 2014-12-30 DIAGNOSIS — N390 Urinary tract infection, site not specified: Secondary | ICD-10-CM | POA: Diagnosis not present

## 2014-12-31 ENCOUNTER — Encounter (HOSPITAL_COMMUNITY): Payer: Self-pay | Admitting: Emergency Medicine

## 2014-12-31 ENCOUNTER — Emergency Department (HOSPITAL_COMMUNITY)
Admission: EM | Admit: 2014-12-31 | Discharge: 2014-12-31 | Disposition: A | Discharge status: Home / Self Care | Payer: Medicare Other | Attending: Emergency Medicine | Admitting: Emergency Medicine

## 2014-12-31 DIAGNOSIS — Z79899 Other long term (current) drug therapy: Secondary | ICD-10-CM | POA: Diagnosis not present

## 2014-12-31 DIAGNOSIS — R197 Diarrhea, unspecified: Secondary | ICD-10-CM | POA: Diagnosis not present

## 2014-12-31 DIAGNOSIS — R112 Nausea with vomiting, unspecified: Secondary | ICD-10-CM | POA: Diagnosis not present

## 2014-12-31 DIAGNOSIS — Z8659 Personal history of other mental and behavioral disorders: Secondary | ICD-10-CM | POA: Diagnosis not present

## 2014-12-31 DIAGNOSIS — M545 Low back pain: Secondary | ICD-10-CM | POA: Diagnosis not present

## 2014-12-31 DIAGNOSIS — E039 Hypothyroidism, unspecified: Secondary | ICD-10-CM | POA: Insufficient documentation

## 2014-12-31 DIAGNOSIS — I10 Essential (primary) hypertension: Secondary | ICD-10-CM | POA: Insufficient documentation

## 2014-12-31 DIAGNOSIS — Z8614 Personal history of Methicillin resistant Staphylococcus aureus infection: Secondary | ICD-10-CM | POA: Diagnosis not present

## 2014-12-31 DIAGNOSIS — N400 Enlarged prostate without lower urinary tract symptoms: Secondary | ICD-10-CM | POA: Insufficient documentation

## 2014-12-31 DIAGNOSIS — E669 Obesity, unspecified: Secondary | ICD-10-CM | POA: Insufficient documentation

## 2014-12-31 DIAGNOSIS — M1990 Unspecified osteoarthritis, unspecified site: Secondary | ICD-10-CM | POA: Insufficient documentation

## 2014-12-31 DIAGNOSIS — R5383 Other fatigue: Secondary | ICD-10-CM | POA: Diagnosis not present

## 2014-12-31 DIAGNOSIS — Z8669 Personal history of other diseases of the nervous system and sense organs: Secondary | ICD-10-CM | POA: Insufficient documentation

## 2014-12-31 DIAGNOSIS — K219 Gastro-esophageal reflux disease without esophagitis: Secondary | ICD-10-CM | POA: Insufficient documentation

## 2014-12-31 DIAGNOSIS — Z87891 Personal history of nicotine dependence: Secondary | ICD-10-CM | POA: Diagnosis not present

## 2014-12-31 DIAGNOSIS — R1030 Lower abdominal pain, unspecified: Secondary | ICD-10-CM | POA: Diagnosis present

## 2014-12-31 DIAGNOSIS — Z8619 Personal history of other infectious and parasitic diseases: Secondary | ICD-10-CM | POA: Insufficient documentation

## 2014-12-31 DIAGNOSIS — R42 Dizziness and giddiness: Secondary | ICD-10-CM | POA: Insufficient documentation

## 2014-12-31 LAB — COMPREHENSIVE METABOLIC PANEL
ALT: 57 U/L (ref 17–63)
AST: 67 U/L — ABNORMAL HIGH (ref 15–41)
Albumin: 3.7 g/dL (ref 3.5–5.0)
Alkaline Phosphatase: 87 U/L (ref 38–126)
Anion gap: 7 (ref 5–15)
BUN: 13 mg/dL (ref 6–20)
CO2: 27 mmol/L (ref 22–32)
Calcium: 9.1 mg/dL (ref 8.9–10.3)
Chloride: 102 mmol/L (ref 101–111)
Creatinine, Ser: 1.01 mg/dL (ref 0.61–1.24)
GFR calc Af Amer: >60 mL/min (ref >60)
GFR calc non Af Amer: >60 mL/min (ref >60)
Glucose, Bld: 98 mg/dL (ref 65–99)
Potassium: 4.2 mmol/L (ref 3.5–5.1)
Sodium: 136 mmol/L (ref 135–145)
Total Bilirubin: 0.7 mg/dL (ref 0.3–1.2)
Total Protein: 6.5 g/dL (ref 6.5–8.1)

## 2014-12-31 LAB — URINE MICROSCOPIC-ADD ON

## 2014-12-31 LAB — CBC WITH DIFFERENTIAL/PLATELET
BASOS ABS: 0 10*3/uL (ref 0.0–0.1)
BASOS PCT: 0 %
EOS PCT: 0 %
Eosinophils Absolute: 0 10*3/uL (ref 0.0–0.7)
HEMATOCRIT: 32.8 % — AB (ref 39.0–52.0)
Hemoglobin: 11 g/dL — ABNORMAL LOW (ref 13.0–17.0)
Lymphocytes Relative: 12 %
Lymphs Abs: 0.9 10*3/uL (ref 0.7–4.0)
MCH: 28.1 pg (ref 26.0–34.0)
MCHC: 33.5 g/dL (ref 30.0–36.0)
MCV: 83.7 fL (ref 78.0–100.0)
MONO ABS: 0.4 10*3/uL (ref 0.1–1.0)
MONOS PCT: 6 %
NEUTROS ABS: 6.2 10*3/uL (ref 1.7–7.7)
Neutrophils Relative %: 82 %
PLATELETS: 213 10*3/uL (ref 150–400)
RBC: 3.92 MIL/uL — ABNORMAL LOW (ref 4.22–5.81)
RDW: 13.1 % (ref 11.5–15.5)
WBC: 7.5 10*3/uL (ref 4.0–10.5)

## 2014-12-31 LAB — LIPASE, BLOOD: Lipase: 16 U/L — ABNORMAL LOW (ref 22–51)

## 2014-12-31 LAB — URINALYSIS, ROUTINE W REFLEX MICROSCOPIC
BILIRUBIN URINE: NEGATIVE
Glucose, UA: NEGATIVE mg/dL
KETONES UR: NEGATIVE mg/dL
NITRITE: POSITIVE — AB
PH: 8 (ref 5.0–8.0)
Protein, ur: NEGATIVE mg/dL
SPECIFIC GRAVITY, URINE: 1.01 (ref 1.005–1.030)
UROBILINOGEN UA: 0.2 mg/dL (ref 0.0–1.0)

## 2014-12-31 MED ORDER — HYDROMORPHONE HCL 1 MG/ML IJ SOLN
0.5000 mg | Freq: Once | INTRAMUSCULAR | Status: AC
  Administered 2014-12-31: 0.5 mg via INTRAVENOUS
  Filled 2014-12-31: qty 1

## 2014-12-31 MED ORDER — HYDROMORPHONE HCL 1 MG/ML IJ SOLN
1.0000 mg | Freq: Once | INTRAMUSCULAR | Status: AC
  Administered 2014-12-31: 1 mg via INTRAVENOUS
  Filled 2014-12-31: qty 1

## 2014-12-31 MED ORDER — ONDANSETRON HCL 4 MG/2ML IJ SOLN
4.0000 mg | Freq: Once | INTRAMUSCULAR | Status: AC
Start: 2014-12-31 — End: 2014-12-31
  Administered 2014-12-31: 4 mg via INTRAVENOUS
  Filled 2014-12-31: qty 2

## 2014-12-31 MED ORDER — SODIUM CHLORIDE 0.9 % IV BOLUS (SEPSIS)
500.0000 mL | Freq: Once | INTRAVENOUS | Status: AC
  Administered 2014-12-31: 500 mL via INTRAVENOUS

## 2014-12-31 NOTE — ED Notes (Addendum)
Pt reports severe abdominal pain and lower right flank pain since last night.  Pt reports n/v/d and reports that his "butt is numb." Emesis x2.  Pt alert and oriented. Pt has suprapubic catheter x78month. Pt seen at [facility] last night and was diagnosed with bladder infection and given antibiotics.

## 2014-12-31 NOTE — ED Notes (Signed)
Patient states that his pain has returned. Updated vitals. Foley emptied.

## 2014-12-31 NOTE — ED Provider Notes (Signed)
CSN: [id number]     Arrival date & time 12/31/14  1431 History   First MD Initiated Contact with Patient 12/31/14 1506     Chief Complaint  Patient presents with  . Abdominal Pain      Patient is a 69 y.o. male presenting with abdominal pain. The history is provided by the patient and the spouse.  Abdominal Pain Pain location:  Suprapubic Pain quality: sharp   Pain radiates to:  R flank Pain severity:  Severe Timing:  Constant Relieved by:  Nothing Worsened by:  Movement Associated symptoms: diarrhea, fatigue, nausea and vomiting   Associated symptoms: no chest pain, no chills, no cough, no fever, no hematuria, no shortness of breath and no sore throat    Kenneth Rhodes is a 69 year old male with PMH including HTN, chronic Hep C, BPH, hypothyroidism, HLD who presents to the ED today for evaluation of abdominal and R flank pain. States the pain began yesterday AM around his suprapubic catheter. The catheter has reportedly been present for ~1 mo and was checked by pt's urologist ~2 weeks ago and "everything was fine." The pt states he was seen at [facility] and diagnosed with a UTI. CT abd/pelvis there was unremarkable. Pt states he was prescribed Bactrim DS BID and discharged home; last dose of Bactrim 08:00 today. Kenneth Rhodes reports the pain increased overnight and this morning began feeling nauseated with 2 episodes of NBNB emesis and 4 episodes of watery, non-bloody diarrhea. Pt denies any emesis or diarrhea since this morning but continues to endorse mild nausea. States his pain is currently 9/10. States he feels fatigued and sometimes lightheaded. Denies dizziness or syncope. Denies chest pain, SOB, fever, chills, cough, or congestion. Denies recent travel. Denies change in appetite. Pt reports he has been on suboxone therapy since his back surgery last year.  Past Medical History  Diagnosis Date  . Hyperlipidemia   . MRSA (methicillin resistant staph aureus) culture positive   .  Hypertension   . Hypothyroidism   . Depression   . Liver disease   . Chronic hepatitis C ([place])   . Sleep apnea   . Obesity   . GERD (gastroesophageal reflux disease)   . BPH (benign prostatic hypertrophy)   . C. difficile diarrhea   . Arthritis   . Blood transfusion without reported diagnosis   . Substance abuse     pain medication  . Hepatitis C    Past Surgical History  Procedure Laterality Date  . Spine surgery      due to mrsa  . Medial partial knee replacement Right   . Lumbar epidural injection    . Lumbar disc surgery     Family History  Problem Relation Age of Onset  . Ovarian cancer Mother   . AAA (abdominal aortic aneurysm) Father   . Colon cancer Mother     dx age 40  . Esophageal cancer Neg Hx   . Kidney disease Neg Hx   . Liver disease Neg Hx    Social History  Substance Use Topics  . Smoking status: Former Smoker    Quit date: 03/21/1994  . Smokeless tobacco: Never Used  . Alcohol Use: No    Review of Systems  Constitutional: Positive for fatigue. Negative for fever, chills and diaphoresis.  HENT: Negative for congestion and sore throat.   Respiratory: Negative for cough and shortness of breath.   Cardiovascular: Negative for chest pain.  Gastrointestinal: Positive for nausea, vomiting, abdominal pain and  diarrhea. Negative for blood in stool and abdominal distention.  Genitourinary: Positive for flank pain. Negative for hematuria.  Musculoskeletal: Positive for back pain. Negative for neck pain and neck stiffness.  Neurological: Positive for light-headedness. Negative for dizziness and headaches.      Allergies  Hydrocodone  Home Medications   Prior to Admission medications   Medication Sig Start Date End Date Taking? Authorizing Provider  famotidine (PEPCID) 40 MG tablet take 1 tablet by mouth twice a day 11/05/14  Yes Amy S Esterwood, PA-C  glycopyrrolate (ROBINUL) 1 MG tablet Take 1 mg by mouth 2 (two) times daily.  11/17/14  Yes  Historical Provider, MD  levothyroxine (SYNTHROID, LEVOTHROID) 125 MCG tablet TAKE 1 TABLET BY MOUTH EVERY DAY NOTE DOSAGE CHANGE 07/22/14  Yes Chipper Herb, MD  naproxen sodium (ALEVE) 220 MG tablet Take 220 mg by mouth daily as needed (FOR PAIN).   Yes Historical Provider, MD  omeprazole (PRILOSEC) 20 MG capsule Take 20 mg by mouth at bedtime.  08/11/14  Yes Historical Provider, MD  oxybutynin (DITROPAN) 5 MG tablet Take 5 mg by mouth 3 (three) times daily. 12/19/14  Yes Historical Provider, MD  phenazopyridine (AZO URINARY PAIN RELIEF) 95 MG tablet Take 95 mg by mouth once as needed for pain.   Yes Historical Provider, MD  pregabalin (LYRICA) 150 MG capsule Take 150 mg by mouth daily as needed (FOR PAIN).    Yes Historical Provider, MD  SUBOXONE 12-3 MG FILM Take 1 Film by mouth daily.  04/24/14  Yes Historical Provider, MD  sulfamethoxazole-trimethoprim (BACTRIM DS,SEPTRA DS) 800-160 MG tablet Take 1 tablet by mouth 2 (two) times daily. 14 day course starting on 12/30/2014 12/30/14  Yes Historical Provider, MD  tamsulosin (FLOMAX) 0.4 MG CAPS capsule Take 0.4 mg by mouth 2 (two) times daily.  01/31/14  Yes Historical Provider, MD   BP 141/75 mmHg  Pulse 58  Temp(Src) 98.4 F (36.9 C)  Resp 20  Ht 6\' 1"  (1.854 m)  Wt 260 lb (117.935 kg)  BMI 34.31 kg/m2  SpO2 95% Physical Exam  Constitutional: He is oriented to person, place, and time. No distress.  HENT:  Right Ear: External ear normal.  Left Ear: External ear normal.  Mouth/Throat: Mucous membranes are dry. No oropharyngeal exudate.  Eyes: Conjunctivae and EOM are normal. Pupils are equal, round, and reactive to light. No scleral icterus.  Neck: Normal range of motion. Neck supple.  Cardiovascular: Normal rate, regular rhythm, normal heart sounds and intact distal pulses.   Pulmonary/Chest: Effort normal and breath sounds normal. No respiratory distress. He has no wheezes. He exhibits no tenderness.  Abdominal: Soft. Bowel sounds are  normal. He exhibits no distension and no mass. There is tenderness in the suprapubic area. There is CVA tenderness. There is no rebound and no guarding.  R CVA tenderness. Mild tenderness immediately surrounding suprapubic catheter. Mild erythema immediately surrounding catheter but no purulent discharge or induration. Cath with good UOP.  Musculoskeletal: Normal range of motion. He exhibits no edema.  Lymphadenopathy:    He has no cervical adenopathy.  Neurological: He is alert and oriented to person, place, and time. No cranial nerve deficit.  Skin: Skin is warm and dry.  Psychiatric: He has a normal mood and affect.  Nursing note and vitals reviewed.   ED Course  Procedures (including critical care time) Labs Review Labs Reviewed  COMPREHENSIVE METABOLIC PANEL - Abnormal; Notable for the following:    AST 67 (*)  All other components within normal limits  LIPASE, BLOOD - Abnormal; Notable for the following:    Lipase 16 (*)    All other components within normal limits  CBC WITH DIFFERENTIAL/PLATELET - Abnormal; Notable for the following:    RBC 3.92 (*)    Hemoglobin 11.0 (*)    HCT 32.8 (*)    All other components within normal limits  URINALYSIS, ROUTINE W REFLEX MICROSCOPIC (NOT AT [facility]) - Abnormal; Notable for the following:    Hgb urine dipstick TRACE (*)    Nitrite POSITIVE (*)    Leukocytes, UA LARGE (*)    All other components within normal limits  URINE MICROSCOPIC-ADD ON - Abnormal; Notable for the following:    Squamous Epithelial / LPF FEW (*)    Bacteria, UA MANY (*)    All other components within normal limits     MDM   Final diagnoses:  Lower abdominal pain    Abd pain unclear etiology but likely secondary to bladder spasm. Will check UA/UC here. Sending CBC and CMP to check infection status, kidney status, electrolytes. Will check lipase as abd pain etiology not entirely clear, though suspicion is low for pancreatic cause. No indication to repeat  imaging at this time. Will give dilaudid, zofran, and fluids. Pt is a chronic pain patient and on suboxone therapy and will need to f/u with PCP or pain management as we cannot send home with narcotic rx. Anticipate discharge home today.  UA consistent with UTI. Will send for culture. Pt will likely need a different abx than Bactrim. Pt to be discharged home and was instructed to follow-up with PCP/urology tomorrow for further non-emergent evaluation and pain management.  Lucrezia Starch, PA-C 12/31/14 2001  Noemi Chapel, MD 01/02/15 [phone]

## 2014-12-31 NOTE — Discharge Instructions (Signed)
Please follow up with your primary care provider and your urologist tomorrow for your abdominal pain. We will send your urine for culture and the antibiotic will likely need to be changed from what you are currently taking pending culture results.  Please obtain all of your results from medical records or have your doctors office obtain the results - share them with your doctor - you should be seen at your doctors office in the next 2 days. Call today to arrange your follow up. Take the medications as prescribed. Please review all of the medicines and only take them if you do not have an allergy to them. Please be aware that if you are taking birth control pills, taking other prescriptions, ESPECIALLY ANTIBIOTICS may make the birth control ineffective - if this is the case, either do not engage in sexual activity or use alternative methods of birth control such as condoms until you have finished the medicine and your family doctor says it is OK to restart them. If you are on a blood thinner such as COUMADIN, be aware that any other medicine that you take may cause the coumadin to either work too much, or not enough - you should have your coumadin level rechecked in next 7 days if this is the case.  ?  It is also a possibility that you have an allergic reaction to any of the medicines that you have been prescribed - Everybody reacts differently to medications and while MOST people have no trouble with most medicines, you may have a reaction such as nausea, vomiting, rash, swelling, shortness of breath. If this is the case, please stop taking the medicine immediately and contact your physician.  ?  You should return to the ER if you develop severe or worsening symptoms.

## 2014-12-31 NOTE — ED Provider Notes (Signed)
The patient is a 69 year old male, he has a history of a spinal infection which resulted in partial paraplegia, persistent numbness around the hips, he has had some urinary difficulties and has had in and out catheterizations, he used to do self cath, then he had a Foley placed and approximately 1 month ago had a suprapubic catheter placed. He denies fevers or chills but has had intermittent bladder spasms for which she takes oxybutynin. He reports a purulent drainage from around the catheter when he cleans the catheter site but no redness of the skin.  On exam the patient appears in no distress, he has a diffusely soft abdomen with minimal tenderness around the suprapubic catheter site, there is no guarding, no masses, no peritoneal signs. Heart rate is normal, lungs are normal, speaks in full sentences, no peripheral edema, neurologically the patient is at his baseline, he had a workup yesterday at [facility]  Those images were reviewed, report showing no signs of acute findings Catheter is in place, no signs of abscess around that area, the patient's workup and exam is consistent with bladder spasms, he has been started on Bactrim for urinary infection, will repeat urinalysis, urine culture, no imaging indicated, he has chronic pain issues, he is currently taking medications to prevent opiate withdrawal as he has had a history of needing chronic pain medications. I have told him that he will need to follow-up with his family doctor for chronic opiate prescriptions as we will not prescribe that but will give him a dose of pain medication here. He is in agreement  Filed Vitals:   12/31/14 1435  BP: 147/94  Pulse: 80  Temp: 98.4 F (36.9 C)  Resp: 14  Height: 6\' 1"  (1.854 m)  Weight: 260 lb (117.935 kg)  SpO2: 99%   Medical screening examination/treatment/procedure(s) were conducted as a shared visit with non-physician practitioner(s) and myself.  I personally evaluated the patient during  the encounter.  Clinical Impression:   Final diagnoses:  Lower abdominal pain          Noemi Chapel, MD 01/02/15 [phone]

## 2015-01-01 DIAGNOSIS — M47816 Spondylosis without myelopathy or radiculopathy, lumbar region: Secondary | ICD-10-CM | POA: Diagnosis not present

## 2015-01-01 DIAGNOSIS — Z981 Arthrodesis status: Secondary | ICD-10-CM | POA: Diagnosis not present

## 2015-01-01 DIAGNOSIS — Z9889 Other specified postprocedural states: Secondary | ICD-10-CM | POA: Diagnosis not present

## 2015-01-01 DIAGNOSIS — R102 Pelvic and perineal pain: Secondary | ICD-10-CM | POA: Diagnosis not present

## 2015-01-01 DIAGNOSIS — K5901 Slow transit constipation: Secondary | ICD-10-CM | POA: Diagnosis not present

## 2015-01-01 DIAGNOSIS — N3289 Other specified disorders of bladder: Secondary | ICD-10-CM | POA: Diagnosis not present

## 2015-01-02 DIAGNOSIS — R1030 Lower abdominal pain, unspecified: Secondary | ICD-10-CM | POA: Diagnosis not present

## 2015-01-02 DIAGNOSIS — K5900 Constipation, unspecified: Secondary | ICD-10-CM | POA: Diagnosis not present

## 2015-01-02 DIAGNOSIS — R1110 Vomiting, unspecified: Secondary | ICD-10-CM | POA: Diagnosis not present

## 2015-01-02 DIAGNOSIS — R109 Unspecified abdominal pain: Secondary | ICD-10-CM | POA: Diagnosis not present

## 2015-01-03 DIAGNOSIS — N390 Urinary tract infection, site not specified: Secondary | ICD-10-CM | POA: Diagnosis not present

## 2015-01-03 DIAGNOSIS — B9620 Unspecified Escherichia coli [E. coli] as the cause of diseases classified elsewhere: Secondary | ICD-10-CM | POA: Diagnosis not present

## 2015-01-05 DIAGNOSIS — R109 Unspecified abdominal pain: Secondary | ICD-10-CM | POA: Diagnosis not present

## 2015-01-06 DIAGNOSIS — K5901 Slow transit constipation: Secondary | ICD-10-CM | POA: Diagnosis not present

## 2015-01-06 DIAGNOSIS — R102 Pelvic and perineal pain: Secondary | ICD-10-CM | POA: Diagnosis not present

## 2015-01-08 ENCOUNTER — Telehealth: Payer: Self-pay | Admitting: Family

## 2015-01-08 DIAGNOSIS — E039 Hypothyroidism, unspecified: Secondary | ICD-10-CM | POA: Diagnosis not present

## 2015-01-08 DIAGNOSIS — N3289 Other specified disorders of bladder: Secondary | ICD-10-CM

## 2015-01-08 DIAGNOSIS — R1030 Lower abdominal pain, unspecified: Secondary | ICD-10-CM | POA: Diagnosis not present

## 2015-01-08 DIAGNOSIS — F419 Anxiety disorder, unspecified: Secondary | ICD-10-CM | POA: Diagnosis not present

## 2015-01-08 DIAGNOSIS — Z885 Allergy status to narcotic agent status: Secondary | ICD-10-CM | POA: Diagnosis not present

## 2015-01-08 DIAGNOSIS — R197 Diarrhea, unspecified: Secondary | ICD-10-CM | POA: Diagnosis not present

## 2015-01-08 DIAGNOSIS — F329 Major depressive disorder, single episode, unspecified: Secondary | ICD-10-CM | POA: Diagnosis not present

## 2015-01-08 DIAGNOSIS — Z79899 Other long term (current) drug therapy: Secondary | ICD-10-CM | POA: Diagnosis not present

## 2015-01-08 DIAGNOSIS — E669 Obesity, unspecified: Secondary | ICD-10-CM | POA: Diagnosis not present

## 2015-01-08 DIAGNOSIS — Z87891 Personal history of nicotine dependence: Secondary | ICD-10-CM | POA: Diagnosis not present

## 2015-01-08 DIAGNOSIS — G8929 Other chronic pain: Secondary | ICD-10-CM

## 2015-01-08 DIAGNOSIS — M810 Age-related osteoporosis without current pathological fracture: Secondary | ICD-10-CM | POA: Diagnosis not present

## 2015-01-08 DIAGNOSIS — G4730 Sleep apnea, unspecified: Secondary | ICD-10-CM | POA: Diagnosis not present

## 2015-01-08 DIAGNOSIS — N400 Enlarged prostate without lower urinary tract symptoms: Secondary | ICD-10-CM

## 2015-01-08 DIAGNOSIS — J449 Chronic obstructive pulmonary disease, unspecified: Secondary | ICD-10-CM | POA: Diagnosis not present

## 2015-01-08 DIAGNOSIS — R3982 Chronic bladder pain: Secondary | ICD-10-CM | POA: Diagnosis not present

## 2015-01-08 DIAGNOSIS — Z87440 Personal history of urinary (tract) infections: Secondary | ICD-10-CM | POA: Diagnosis not present

## 2015-01-08 DIAGNOSIS — I10 Essential (primary) hypertension: Secondary | ICD-10-CM | POA: Diagnosis not present

## 2015-01-08 NOTE — Telephone Encounter (Signed)
Where does patient want to go in [place]? What type of specialists?

## 2015-01-08 NOTE — Telephone Encounter (Signed)
Stp and he would like a second opinion from a different urologist.

## 2015-01-14 ENCOUNTER — Emergency Department (HOSPITAL_COMMUNITY)
Admission: EM | Admit: 2015-01-14 | Discharge: 2015-01-14 | Disposition: A | Discharge status: Home / Self Care | Payer: Medicare Other | Attending: Emergency Medicine | Admitting: Emergency Medicine

## 2015-01-14 ENCOUNTER — Telehealth: Payer: Self-pay | Admitting: Family

## 2015-01-14 ENCOUNTER — Encounter (HOSPITAL_COMMUNITY): Payer: Self-pay

## 2015-01-14 DIAGNOSIS — R197 Diarrhea, unspecified: Secondary | ICD-10-CM | POA: Diagnosis not present

## 2015-01-14 DIAGNOSIS — E039 Hypothyroidism, unspecified: Secondary | ICD-10-CM | POA: Insufficient documentation

## 2015-01-14 DIAGNOSIS — F329 Major depressive disorder, single episode, unspecified: Secondary | ICD-10-CM | POA: Insufficient documentation

## 2015-01-14 DIAGNOSIS — Z8614 Personal history of Methicillin resistant Staphylococcus aureus infection: Secondary | ICD-10-CM | POA: Insufficient documentation

## 2015-01-14 DIAGNOSIS — R109 Unspecified abdominal pain: Secondary | ICD-10-CM | POA: Diagnosis not present

## 2015-01-14 DIAGNOSIS — M1990 Unspecified osteoarthritis, unspecified site: Secondary | ICD-10-CM | POA: Insufficient documentation

## 2015-01-14 DIAGNOSIS — M6281 Muscle weakness (generalized): Secondary | ICD-10-CM | POA: Insufficient documentation

## 2015-01-14 DIAGNOSIS — K219 Gastro-esophageal reflux disease without esophagitis: Secondary | ICD-10-CM | POA: Diagnosis not present

## 2015-01-14 DIAGNOSIS — N400 Enlarged prostate without lower urinary tract symptoms: Secondary | ICD-10-CM | POA: Diagnosis not present

## 2015-01-14 DIAGNOSIS — Z87891 Personal history of nicotine dependence: Secondary | ICD-10-CM | POA: Diagnosis not present

## 2015-01-14 DIAGNOSIS — I10 Essential (primary) hypertension: Secondary | ICD-10-CM | POA: Diagnosis not present

## 2015-01-14 DIAGNOSIS — Z8619 Personal history of other infectious and parasitic diseases: Secondary | ICD-10-CM | POA: Diagnosis not present

## 2015-01-14 DIAGNOSIS — Z87828 Personal history of other (healed) physical injury and trauma: Secondary | ICD-10-CM | POA: Insufficient documentation

## 2015-01-14 DIAGNOSIS — E669 Obesity, unspecified: Secondary | ICD-10-CM | POA: Diagnosis not present

## 2015-01-14 DIAGNOSIS — B1920 Unspecified viral hepatitis C without hepatic coma: Secondary | ICD-10-CM

## 2015-01-14 DIAGNOSIS — R5383 Other fatigue: Secondary | ICD-10-CM | POA: Diagnosis not present

## 2015-01-14 DIAGNOSIS — Z79899 Other long term (current) drug therapy: Secondary | ICD-10-CM | POA: Insufficient documentation

## 2015-01-14 DIAGNOSIS — K5900 Constipation, unspecified: Secondary | ICD-10-CM | POA: Diagnosis not present

## 2015-01-14 DIAGNOSIS — G8929 Other chronic pain: Secondary | ICD-10-CM

## 2015-01-14 DIAGNOSIS — R200 Anesthesia of skin: Secondary | ICD-10-CM | POA: Diagnosis not present

## 2015-01-14 DIAGNOSIS — N3289 Other specified disorders of bladder: Secondary | ICD-10-CM | POA: Diagnosis not present

## 2015-01-14 DIAGNOSIS — R110 Nausea: Secondary | ICD-10-CM | POA: Insufficient documentation

## 2015-01-14 LAB — CBC WITH DIFFERENTIAL/PLATELET
Basophils Absolute: 0 10*3/uL (ref 0.0–0.1)
Basophils Relative: 1 %
EOS PCT: 1 %
Eosinophils Absolute: 0.1 10*3/uL (ref 0.0–0.7)
HEMATOCRIT: 35.1 % — AB (ref 39.0–52.0)
Hemoglobin: 11.9 g/dL — ABNORMAL LOW (ref 13.0–17.0)
LYMPHS ABS: 1.2 10*3/uL (ref 0.7–4.0)
LYMPHS PCT: 24 %
MCH: 28.5 pg (ref 26.0–34.0)
MCHC: 33.9 g/dL (ref 30.0–36.0)
MCV: 84 fL (ref 78.0–100.0)
Monocytes Absolute: 0.4 10*3/uL (ref 0.1–1.0)
Monocytes Relative: 8 %
Neutro Abs: 3.5 10*3/uL (ref 1.7–7.7)
Neutrophils Relative %: 66 %
PLATELETS: 217 10*3/uL (ref 150–400)
RBC: 4.18 MIL/uL — AB (ref 4.22–5.81)
RDW: 13 % (ref 11.5–15.5)
WBC: 5.2 10*3/uL (ref 4.0–10.5)

## 2015-01-14 LAB — URINE MICROSCOPIC-ADD ON

## 2015-01-14 LAB — URINALYSIS, ROUTINE W REFLEX MICROSCOPIC
Bilirubin Urine: NEGATIVE
GLUCOSE, UA: NEGATIVE mg/dL
Hgb urine dipstick: NEGATIVE
KETONES UR: NEGATIVE mg/dL
Nitrite: NEGATIVE
PH: 6.5 (ref 5.0–8.0)
Protein, ur: NEGATIVE mg/dL
SPECIFIC GRAVITY, URINE: 1.015 (ref 1.005–1.030)
Urobilinogen, UA: 0.2 mg/dL (ref 0.0–1.0)

## 2015-01-14 LAB — COMPREHENSIVE METABOLIC PANEL
ALBUMIN: 3.9 g/dL (ref 3.5–5.0)
ALT: 52 U/L (ref 17–63)
AST: 35 U/L (ref 15–41)
Alkaline Phosphatase: 90 U/L (ref 38–126)
Anion gap: 5 (ref 5–15)
BUN: 10 mg/dL (ref 6–20)
CO2: 27 mmol/L (ref 22–32)
Calcium: 9.4 mg/dL (ref 8.9–10.3)
Chloride: 105 mmol/L (ref 101–111)
Creatinine, Ser: 0.73 mg/dL (ref 0.61–1.24)
GFR calc Af Amer: >60 mL/min (ref >60)
GLUCOSE: 97 mg/dL (ref 65–99)
POTASSIUM: 4.1 mmol/L (ref 3.5–5.1)
Sodium: 137 mmol/L (ref 135–145)
Total Bilirubin: 0.7 mg/dL (ref 0.3–1.2)
Total Protein: 7 g/dL (ref 6.5–8.1)

## 2015-01-14 MED ORDER — LORAZEPAM 1 MG PO TABS: 1.0000 mg | ORAL_TABLET | Freq: Three times a day (TID) | ORAL | Status: DC | PRN

## 2015-01-14 MED ORDER — PROMETHAZINE HCL 25 MG/ML IJ SOLN
12.5000 mg | Freq: Once | INTRAMUSCULAR | Status: AC
  Administered 2015-01-14: 12.5 mg via INTRAVENOUS
  Filled 2015-01-14: qty 1

## 2015-01-14 MED ORDER — DICYCLOMINE HCL 20 MG PO TABS: 20.0000 mg | ORAL_TABLET | Freq: Two times a day (BID) | ORAL | Status: DC | PRN

## 2015-01-14 MED ORDER — LORAZEPAM 2 MG/ML IJ SOLN
0.5000 mg | Freq: Once | INTRAMUSCULAR | Status: AC
  Administered 2015-01-14: 0.5 mg via INTRAVENOUS
  Filled 2015-01-14: qty 1

## 2015-01-14 NOTE — ED Notes (Signed)
Pt reports lower abd pain and pain in lower back for the past 2 months.  Reports has a suprapubic catheter and has seen several doctors for same symptoms and says has been in the er several times.  Pt reports n/v x 2 days.  Reports had diarrhea x 4 or 5 Monday.  Pt says took some pepto bismol and diarrhea subsided.  Last bm was yesterday.

## 2015-01-14 NOTE — ED Notes (Signed)
Pt stating he feels sick. Pt states he has nausea, is shaking, and his bp is going up. EDP in with pt

## 2015-01-14 NOTE — ED Notes (Signed)
Patient states that he is in a lot of pain. States that he needs something for pain.

## 2015-01-14 NOTE — Telephone Encounter (Signed)
GI referral sent

## 2015-01-14 NOTE — Telephone Encounter (Signed)
Sister called and stated that she got in touch with a GI at [facility] and they agreed to see patient if we would send referral and info. She states that most of his problems are GI related and that he is in terrible shape and really needs to see them. Suggested ER burt patients sister states that he has been multiple times and they always think he is drug seeking. Please advise on referral. Phone number to GI at [facility] is [phone] and fax is [phone]. Please advise and route to [facility]

## 2015-01-14 NOTE — Discharge Instructions (Signed)

## 2015-01-14 NOTE — Telephone Encounter (Signed)
Stp's sister and advised referral made and that it can take a few days. Advised pt should go to ER since he hasnt been able to keep anything down in over 24 hours. Pt's sister states the pt doesn't like going to the ER as they make him feel like he is seeking drugs.

## 2015-01-14 NOTE — ED Provider Notes (Signed)
CSN: [id number]     Arrival date & time 01/14/15  1723 History   First MD Initiated Contact with Patient 01/14/15 1808     Chief Complaint  Patient presents with  . Abdominal Pain     (Consider location/radiation/quality/duration/timing/severity/associated sxs/prior Treatment) Patient is a 69 y.o. male presenting with abdominal pain. The history is provided by the patient.  Abdominal Pain Associated symptoms: constipation, diarrhea, fatigue and nausea   Associated symptoms: no chest pain, no shortness of breath and no vomiting    patient presents with abdominal pain. Has had some nausea vomiting diarrhea also. States he took some Maalox on Monday and began to have constipation yesterday. His had chronic episodes of the abdominal pain with nausea vomiting. Has seen gastroenterology has been admitted. No clear cause has been found. He did have a MRSA epidural abscess in August of last year. He had some paralysis with it. His had a complicated course since then. Has follow-up with [facility] in approximately a month. Has a suprapubic catheter. No fevers or chills. MRSA  Past Medical History  Diagnosis Date  . Hyperlipidemia   . MRSA (methicillin resistant staph aureus) culture positive   . Hypertension   . Hypothyroidism   . Depression   . Liver disease   . Chronic hepatitis C ([place])   . Sleep apnea   . Obesity   . GERD (gastroesophageal reflux disease)   . BPH (benign prostatic hypertrophy)   . C. difficile diarrhea   . Arthritis   . Blood transfusion without reported diagnosis   . Substance abuse     pain medication  . Hepatitis C    Past Surgical History  Procedure Laterality Date  . Spine surgery      due to mrsa  . Medial partial knee replacement Right   . Lumbar epidural injection    . Lumbar disc surgery     Family History  Problem Relation Age of Onset  . Ovarian cancer Mother   . AAA (abdominal aortic aneurysm) Father   . Colon cancer Mother     dx age 59  .  Esophageal cancer Neg Hx   . Kidney disease Neg Hx   . Liver disease Neg Hx    Social History  Substance Use Topics  . Smoking status: Former Smoker    Quit date: 03/21/1994  . Smokeless tobacco: Never Used  . Alcohol Use: No    Review of Systems  Constitutional: Positive for fatigue. Negative for activity change and appetite change.  Eyes: Negative for pain.  Respiratory: Negative for chest tightness and shortness of breath.   Cardiovascular: Negative for chest pain and leg swelling.  Gastrointestinal: Positive for nausea, abdominal pain, diarrhea and constipation. Negative for vomiting.  Genitourinary: Negative for urgency, flank pain, penile swelling and testicular pain.  Musculoskeletal: Negative for back pain and neck stiffness.  Skin: Negative for rash.  Neurological: Positive for numbness. Negative for weakness and headaches.  Psychiatric/Behavioral: Negative for behavioral problems.      Allergies  Hydrocodone  Home Medications   Prior to Admission medications   Medication Sig Start Date End Date Taking? Authorizing Provider  belladonna-opium (B&O SUPPRETTES) 16.2-30 MG suppository Place 1 suppository rectally 2 (two) times daily as needed. For bladder spasms/pain 01/13/15  Yes Historical Provider, MD  HARVONI 90-400 MG TABS Take 1 tablet by mouth every evening. 01/06/15  Yes Historical Provider, MD  levothyroxine (SYNTHROID, LEVOTHROID) 125 MCG tablet TAKE 1 TABLET BY MOUTH EVERY DAY NOTE DOSAGE CHANGE  Patient taking differently: TAKE 1 TABLET BY MOUTH EVERY DAY 07/22/14  Yes Chipper Herb, MD  naproxen sodium (ALEVE) 220 MG tablet Take 220 mg by mouth daily as needed (FOR PAIN).   Yes Historical Provider, MD  oxybutynin (DITROPAN) 5 MG tablet Take 5 mg by mouth 3 (three) times daily. 12/19/14  Yes Historical Provider, MD  SUBOXONE 12-3 MG FILM Take 1 Film by mouth daily.  04/24/14  Yes Historical Provider, MD  tamsulosin (FLOMAX) 0.4 MG CAPS capsule Take 0.4 mg by mouth  2 (two) times daily.  01/31/14  Yes Historical Provider, MD  dicyclomine (BENTYL) 20 MG tablet Take 1 tablet (20 mg total) by mouth 2 (two) times daily as needed for spasms. 01/14/15   Davonna Belling, MD  famotidine (PEPCID) 40 MG tablet take 1 tablet by mouth twice a day Patient not taking: Reported on 01/14/2015 11/05/14   Amy S Esterwood, PA-C  LORazepam (ATIVAN) 1 MG tablet Take 1 tablet (1 mg total) by mouth 3 (three) times daily as needed for anxiety (spasm). 01/14/15   Davonna Belling, MD  nitrofurantoin, macrocrystal-monohydrate, (MACROBID) 100 MG capsule Take 100 mg by mouth 2 (two) times daily. 5 day course starting on 01/04/2015 01/04/15   Historical Provider, MD  sulfamethoxazole-trimethoprim (BACTRIM DS,SEPTRA DS) 800-160 MG tablet Take 1 tablet by mouth 2 (two) times daily. 14 day course starting on 12/30/2014 12/30/14   Historical Provider, MD   BP 118/82 mmHg  Pulse 64  Temp(Src) 98.2 F (36.8 C) (Oral)  Resp 10  Ht 6\' 1"  (1.854 m)  Wt 250 lb (113.399 kg)  BMI 32.99 kg/m2  SpO2 96% Physical Exam  Constitutional: He appears well-developed.  HENT:  Head: Atraumatic.  Neck: Neck supple.  Pulmonary/Chest: Effort normal.  Abdominal: There is no tenderness.  Suprapubic catheter without signs of infection. Minimal abdominal tenderness.  Musculoskeletal: Normal range of motion.  Neurological: He is alert.  Chronic weakness of bilateral lower extremities.  Skin: There is erythema.    ED Course  Procedures (including critical care time) Labs Review Labs Reviewed  CBC WITH DIFFERENTIAL/PLATELET - Abnormal; Notable for the following:    RBC 4.18 (*)    Hemoglobin 11.9 (*)    HCT 35.1 (*)    All other components within normal limits  URINALYSIS, ROUTINE W REFLEX MICROSCOPIC (NOT AT [facility]) - Abnormal; Notable for the following:    Leukocytes, UA TRACE (*)    All other components within normal limits  COMPREHENSIVE METABOLIC PANEL  URINE MICROSCOPIC-ADD ON    Imaging  Review No results found. I have personally reviewed and evaluated these images and lab results as part of my medical decision-making.   EKG Interpretation None      MDM   Final diagnoses:  Abdominal pain, unspecified abdominal location    Patient with abdominal pain. Has had issues with this since he has had a spinal injury. Has seen multiple specialists for it. No clear cause has been found. Does have suprapubic catheter. Feels somewhat better after treatment. Doubt    my treatment will have much long-term effect his medications have been adjusted a little bit. Discharge home. Family members requested follow-up with a local gastroenterologist.   Davonna Belling, MD 01/15/15 [phone]

## 2015-01-14 NOTE — ED Notes (Signed)
Family members in room keep telling pt that the medicine I am giving him is not going to help him at all. Family also keeps asking this nurse about how long the patient is going to be here. I advised her that the EDP would make that decision.

## 2015-01-16 ENCOUNTER — Encounter (INDEPENDENT_AMBULATORY_CARE_PROVIDER_SITE_OTHER): Payer: Self-pay | Admitting: Internal Medicine

## 2015-01-16 ENCOUNTER — Ambulatory Visit (INDEPENDENT_AMBULATORY_CARE_PROVIDER_SITE_OTHER): Payer: Medicare Other | Admitting: Internal Medicine

## 2015-01-16 VITALS — BP 130/70 | HR 84 | Temp 97.9°F | Ht 74.0 in | Wt 258.0 lb

## 2015-01-16 DIAGNOSIS — G8929 Other chronic pain: Secondary | ICD-10-CM | POA: Diagnosis not present

## 2015-01-16 DIAGNOSIS — R1013 Epigastric pain: Secondary | ICD-10-CM

## 2015-01-16 NOTE — Patient Instructions (Signed)
Miralax 1/2 scoop daily.  Follow up with Dr. Henrene Pastor

## 2015-01-16 NOTE — Progress Notes (Signed)
Subjective:    Patient ID: Kenneth Rhodes, male    DOB: 1945/04/27, 69 y.o.   MRN: [id number]  HPI Here today as a new patient. Presently a patient of Dr. Henrene Pastor of [facility] in [place]. Also seen at the [facility] in [place]. Taking Harvoni. Hx of C-diff in June and was treated with Flagyl by Dr. Henrene Pastor.  He has had multiple stool studies since and all have been negative.  Patient states he has ongoing stomach cramps and diarrhea. This will last for a couple of days and then he will have bladder spasms. He tells me yesterday he had vomiting. He vomited x 2 yesterday and now has resolved. He says he is fine today. He is worried about what to eats.  He had MRSA a year ago in his spine. He contracted MRSA from an back injection.  He also tells me he has numbness in both of his hips and has a hard time having a BM. He says he has to give himself an enema to have a BM.  He has more loose stools than constipation. He takes Miralax every night so he will not become constipated. Appetite has been okay.  He usually has a BM daily and sometimes twice a day.  Started Harvoni this Monday.  Hx of Hepatitis C Patient has suprapubic catheterin place since his back surgery.(Urinary retention)  CMP Latest Ref Rng 01/14/2015 12/31/2014 12/19/2014  Glucose 65 - 99 mg/dL 97 98 100(H)  BUN 6 - 20 mg/dL 10 13 10   Creatinine 0.61 - 1.24 mg/dL 0.73 1.01 0.94  Sodium 135 - 145 mmol/L 137 136 140  Potassium 3.5 - 5.1 mmol/L 4.1 4.2 5.3(H)  Chloride 101 - 111 mmol/L 105 102 99  CO2 22 - 32 mmol/L 27 27 26   Calcium 8.9 - 10.3 mg/dL 9.4 9.1 9.6  Total Protein 6.5 - 8.1 g/dL 7.0 6.5 6.5  Total Bilirubin 0.3 - 1.2 mg/dL 0.7 0.7 0.4  Alkaline Phos 38 - 126 U/L 90 87 102  AST 15 - 41 U/L 35 67(H) 75(H)  ALT 17 - 63 U/L 52 57 72(H)       09/30/2014 EGD: GERD, Dr. Henrene Pastor: Normal EGD, GERD with esohpagitis  11/13/2013 EGD: GERD: [facility]: Dr. Georgina Peer. Impression: LA Grade D  reflux esophagitis. Erythematous mucosa in the stomach. Normal examined duodenum.  Review of Systems Past Medical History  Diagnosis Date  . Hyperlipidemia   . MRSA (methicillin resistant staph aureus) culture positive   . Hypertension   . Hypothyroidism   . Depression   . Liver disease   . Chronic hepatitis C ([place])   . Sleep apnea   . Obesity   . GERD (gastroesophageal reflux disease)   . BPH (benign prostatic hypertrophy)   . C. difficile diarrhea   . Arthritis   . Blood transfusion without reported diagnosis   . Substance abuse     pain medication  . Hepatitis C     Past Surgical History  Procedure Laterality Date  . Spine surgery      due to mrsa  . Medial partial knee replacement Right   . Lumbar epidural injection    . Lumbar disc surgery      Allergies  Allergen Reactions  . Hydrocodone Nausea Only    Current Outpatient Prescriptions on File Prior to Visit  Medication Sig Dispense Refill  . dicyclomine (BENTYL) 20 MG tablet Take 1 tablet (20 mg total) by mouth 2 (  two) times daily as needed for spasms. 10 tablet 0  . famotidine (PEPCID) 40 MG tablet take 1 tablet by mouth twice a day (Patient taking differently: as needed) 60 tablet 2  . HARVONI 90-400 MG TABS Take 1 tablet by mouth every evening.  2  . levothyroxine (SYNTHROID, LEVOTHROID) 125 MCG tablet TAKE 1 TABLET BY MOUTH EVERY DAY NOTE DOSAGE CHANGE (Patient taking differently: TAKE 1 TABLET BY MOUTH EVERY DAY) 90 tablet 2  . LORazepam (ATIVAN) 1 MG tablet Take 1 tablet (1 mg total) by mouth 3 (three) times daily as needed for anxiety (spasm). 8 tablet 0  . SUBOXONE 12-3 MG FILM Take 1 Film by mouth daily.   0  . tamsulosin (FLOMAX) 0.4 MG CAPS capsule Take 0.4 mg by mouth 2 (two) times daily.   0   Current Facility-Administered Medications on File Prior to Visit  Medication Dose Route Frequency Provider Last Rate Last Dose  . methylPREDNISolone acetate (DEPO-MEDROL) injection 80 mg  80 mg Intramuscular  Once Lysbeth Penner, FNP            Objective:   Physical Exam Blood pressure 130/70, pulse 84, temperature 97.9 F (36.6 C), height 6\' 2"  (1.88 m), weight 258 lb (117.028 kg).  Alert and oriented. Skin warm and dry. Oral mucosa is moist.   . Sclera anicteric, conjunctivae is pink. Thyroid not enlarged. No cervical lymphadenopathy. Lungs clear. Heart regular rate and rhythm.  Abdomen is soft. Bowel sounds are positive. No hepatomegaly. No abdominal masses felt. No tenderness.  No edema to lower extremities.  Suprapubic catheter in place.        Assessment & Plan:  Chronic abdominal pain with diarrhea. Presently taking Dicyclomine for the diarrhea. Stools studies have been negative .  Diarrhea: Try just taking 1/2 scoop daily instead of 1 scoop so stools will be more formed. Follow up with Dr. Henrene Pastor.

## 2015-01-19 ENCOUNTER — Ambulatory Visit (INDEPENDENT_AMBULATORY_CARE_PROVIDER_SITE_OTHER): Payer: Medicare Other | Admitting: Family

## 2015-01-19 ENCOUNTER — Encounter: Payer: Self-pay | Admitting: Family

## 2015-01-19 ENCOUNTER — Telehealth: Payer: Self-pay | Admitting: Family

## 2015-01-19 ENCOUNTER — Other Ambulatory Visit: Payer: Self-pay | Admitting: Family

## 2015-01-19 VITALS — BP 139/87 | HR 72 | Temp 98.0°F | Ht 74.0 in | Wt 259.4 lb

## 2015-01-19 DIAGNOSIS — Z09 Encounter for follow-up examination after completed treatment for conditions other than malignant neoplasm: Secondary | ICD-10-CM | POA: Diagnosis not present

## 2015-01-19 DIAGNOSIS — R112 Nausea with vomiting, unspecified: Secondary | ICD-10-CM | POA: Diagnosis not present

## 2015-01-19 DIAGNOSIS — F411 Generalized anxiety disorder: Secondary | ICD-10-CM | POA: Diagnosis not present

## 2015-01-19 MED ORDER — ONDANSETRON HCL 4 MG PO TABS: 4.0000 mg | ORAL_TABLET | Freq: Three times a day (TID) | ORAL | Status: DC | PRN

## 2015-01-19 MED ORDER — LORAZEPAM 1 MG PO TABS: 1.0000 mg | ORAL_TABLET | Freq: Two times a day (BID) | ORAL | Status: DC | PRN

## 2015-01-19 NOTE — Telephone Encounter (Signed)
Pt needs hospital follow up

## 2015-01-19 NOTE — Patient Instructions (Signed)
Generalized Anxiety Disorder Generalized anxiety disorder (GAD) is a mental disorder. It interferes with life functions, including relationships, work, and school. GAD is different from normal anxiety, which everyone experiences at some point in their lives in response to specific life events and activities. Normal anxiety actually helps us prepare for and get through these life events and activities. Normal anxiety goes away after the event or activity is over.  GAD causes anxiety that is not necessarily related to specific events or activities. It also causes excess anxiety in proportion to specific events or activities. The anxiety associated with GAD is also difficult to control. GAD can vary from mild to severe. People with severe GAD can have intense waves of anxiety with physical symptoms (panic attacks).  SYMPTOMS The anxiety and worry associated with GAD are difficult to control. This anxiety and worry are related to many life events and activities and also occur more days than not for 6 months or longer. People with GAD also have three or more of the following symptoms (one or more in children):  Restlessness.   Fatigue.  Difficulty concentrating.   Irritability.  Muscle tension.  Difficulty sleeping or unsatisfying sleep. DIAGNOSIS GAD is diagnosed through an assessment by your health care provider. Your health care provider will ask you questions aboutyour mood,physical symptoms, and events in your life. Your health care provider may ask you about your medical history and use of alcohol or drugs, including prescription medicines. Your health care provider may also do a physical exam and blood tests. Certain medical conditions and the use of certain substances can cause symptoms similar to those associated with GAD. Your health care provider may refer you to a mental health specialist for further evaluation. TREATMENT The following therapies are usually used to treat GAD:    Medication. Antidepressant medication usually is prescribed for long-term daily control. Antianxiety medicines may be added in severe cases, especially when panic attacks occur.   Talk therapy (psychotherapy). Certain types of talk therapy can be helpful in treating GAD by providing support, education, and guidance. A form of talk therapy called cognitive behavioral therapy can teach you healthy ways to think about and react to daily life events and activities.  Stress managementtechniques. These include yoga, meditation, and exercise and can be very helpful when they are practiced regularly. A mental health specialist can help determine which treatment is best for you. Some people see improvement with one therapy. However, other people require a combination of therapies.   This information is not intended to replace advice given to you by your health care provider. Make sure you discuss any questions you have with your health care provider.   Document Released: 07/02/2012 Document Revised: 03/28/2014 Document Reviewed: 07/02/2012 Elsevier Interactive Patient Education 2016 [employer]  

## 2015-01-19 NOTE — Progress Notes (Signed)
   Subjective:    Patient ID: Kenneth Rhodes, male    DOB: Apr 05, 1945, 69 y.o.   MRN: [id number]  HPI Pt presents to the office today for hospital follow up. Pt went to the ED on 01/16/15 for nausea and vomiting, diarrhea, and generalized abdomen pain. Pt states all of his labs were negative. Pt states he has this intermittent nausea and states he has not been able to eat in a few days except a little yogurt. Pt states he was given rx for Ativan 1 mg that helps a lot with his stomach issues. PT is followed by GI for  Hepatitis C and diarrhea. Pt also has a urologists that he see's weekly for a subpubic catheter.  Pt states he is going to [facility] for a second opinion on his Urologists.     Review of Systems  Constitutional: Negative.   HENT: Negative.   Respiratory: Negative.   Cardiovascular: Negative.   Gastrointestinal: Negative.   Endocrine: Negative.   Genitourinary: Negative.   Musculoskeletal: Negative.   Neurological: Negative.   Hematological: Negative.   Psychiatric/Behavioral: Negative.   All other systems reviewed and are negative.      Objective:   Physical Exam  Constitutional: He is oriented to person, place, and time. He appears well-developed and well-nourished. No distress.  HENT:  Head: Normocephalic.  Eyes: Pupils are equal, round, and reactive to light. Right eye exhibits no discharge. Left eye exhibits no discharge.  Neck: Normal range of motion. Neck supple. No thyromegaly present.  Cardiovascular: Normal rate, regular rhythm, normal heart sounds and intact distal pulses.   No murmur heard. Pulmonary/Chest: Effort normal and breath sounds normal. No respiratory distress. He has no wheezes.  Abdominal: Soft. Bowel sounds are normal. He exhibits no distension. There is tenderness (Mild  generalized tenderness ).  Musculoskeletal: Normal range of motion. He exhibits no edema or tenderness.  Neurological: He is alert and oriented to person, place, and time. He  has normal reflexes. No cranial nerve deficit.  Skin: Skin is warm and dry. No rash noted. No erythema.  Psychiatric: He has a normal mood and affect. His behavior is normal. Judgment and thought content normal.  Vitals reviewed.   BP 139/87 mmHg  Pulse 72  Temp(Src) 98 F (36.7 C) (Oral)  Ht 6\' 2"  (1.88 m)  Wt 259 lb 6.4 oz (117.663 kg)  BMI 33.29 kg/m2       Assessment & Plan:  1. GAD (generalized anxiety disorder) -Stress management discussed - LORazepam (ATIVAN) 1 MG tablet; Take 1 tablet (1 mg total) by mouth 2 (two) times daily as needed for anxiety (spasm).  Dispense: 60 tablet; Refill: 2   3. Non-intractable vomiting with nausea, vomiting of unspecified type -Bland diet -Force fluids -Continue probiotics - ondansetron (ZOFRAN) 4 MG tablet; Take 1 tablet (4 mg total) by mouth every 8 (eight) hours as needed for nausea or vomiting.  Dispense: 60 tablet; Refill: 3  4. Hospital discharge follow-up   Evelina Dun, FNP

## 2015-01-19 NOTE — Telephone Encounter (Signed)
Appt made for today

## 2015-01-20 NOTE — Telephone Encounter (Signed)
Not on med list

## 2015-01-21 DIAGNOSIS — N3289 Other specified disorders of bladder: Secondary | ICD-10-CM | POA: Diagnosis not present

## 2015-01-31 DIAGNOSIS — F329 Major depressive disorder, single episode, unspecified: Secondary | ICD-10-CM | POA: Diagnosis not present

## 2015-01-31 DIAGNOSIS — R1030 Lower abdominal pain, unspecified: Secondary | ICD-10-CM | POA: Diagnosis not present

## 2015-01-31 DIAGNOSIS — E039 Hypothyroidism, unspecified: Secondary | ICD-10-CM | POA: Diagnosis not present

## 2015-01-31 DIAGNOSIS — I10 Essential (primary) hypertension: Secondary | ICD-10-CM | POA: Diagnosis not present

## 2015-01-31 DIAGNOSIS — Z79899 Other long term (current) drug therapy: Secondary | ICD-10-CM | POA: Diagnosis not present

## 2015-01-31 DIAGNOSIS — Z8619 Personal history of other infectious and parasitic diseases: Secondary | ICD-10-CM | POA: Diagnosis not present

## 2015-01-31 DIAGNOSIS — N390 Urinary tract infection, site not specified: Secondary | ICD-10-CM | POA: Diagnosis not present

## 2015-02-02 ENCOUNTER — Telehealth: Payer: Self-pay

## 2015-02-02 NOTE — Telephone Encounter (Signed)
Insurance prior authorized Ondansetron HCl

## 2015-02-05 DIAGNOSIS — N319 Neuromuscular dysfunction of bladder, unspecified: Secondary | ICD-10-CM | POA: Diagnosis not present

## 2015-02-06 DIAGNOSIS — Z96651 Presence of right artificial knee joint: Secondary | ICD-10-CM | POA: Diagnosis not present

## 2015-02-06 DIAGNOSIS — M25562 Pain in left knee: Secondary | ICD-10-CM | POA: Diagnosis not present

## 2015-02-09 DIAGNOSIS — N319 Neuromuscular dysfunction of bladder, unspecified: Secondary | ICD-10-CM | POA: Diagnosis not present

## 2015-02-09 DIAGNOSIS — N390 Urinary tract infection, site not specified: Secondary | ICD-10-CM | POA: Diagnosis not present

## 2015-02-10 DIAGNOSIS — B182 Chronic viral hepatitis C: Secondary | ICD-10-CM | POA: Diagnosis not present

## 2015-02-13 DIAGNOSIS — M810 Age-related osteoporosis without current pathological fracture: Secondary | ICD-10-CM | POA: Diagnosis not present

## 2015-02-13 DIAGNOSIS — N3289 Other specified disorders of bladder: Secondary | ICD-10-CM | POA: Diagnosis not present

## 2015-02-13 DIAGNOSIS — N319 Neuromuscular dysfunction of bladder, unspecified: Secondary | ICD-10-CM | POA: Diagnosis not present

## 2015-02-13 DIAGNOSIS — R1030 Lower abdominal pain, unspecified: Secondary | ICD-10-CM | POA: Diagnosis not present

## 2015-02-13 DIAGNOSIS — Z885 Allergy status to narcotic agent status: Secondary | ICD-10-CM | POA: Diagnosis not present

## 2015-02-13 DIAGNOSIS — G4730 Sleep apnea, unspecified: Secondary | ICD-10-CM | POA: Diagnosis not present

## 2015-02-13 DIAGNOSIS — N390 Urinary tract infection, site not specified: Secondary | ICD-10-CM | POA: Diagnosis not present

## 2015-02-13 DIAGNOSIS — F419 Anxiety disorder, unspecified: Secondary | ICD-10-CM | POA: Diagnosis not present

## 2015-02-14 DIAGNOSIS — N390 Urinary tract infection, site not specified: Secondary | ICD-10-CM | POA: Diagnosis not present

## 2015-02-15 DIAGNOSIS — N390 Urinary tract infection, site not specified: Secondary | ICD-10-CM | POA: Diagnosis not present

## 2015-02-16 DIAGNOSIS — R109 Unspecified abdominal pain: Secondary | ICD-10-CM | POA: Diagnosis not present

## 2015-02-17 DIAGNOSIS — R339 Retention of urine, unspecified: Secondary | ICD-10-CM | POA: Diagnosis not present

## 2015-02-18 DIAGNOSIS — N390 Urinary tract infection, site not specified: Secondary | ICD-10-CM | POA: Diagnosis not present

## 2015-02-19 DIAGNOSIS — N3289 Other specified disorders of bladder: Secondary | ICD-10-CM | POA: Diagnosis not present

## 2015-02-19 DIAGNOSIS — N319 Neuromuscular dysfunction of bladder, unspecified: Secondary | ICD-10-CM | POA: Diagnosis not present

## 2015-02-20 DIAGNOSIS — N3289 Other specified disorders of bladder: Secondary | ICD-10-CM | POA: Diagnosis not present

## 2015-02-20 DIAGNOSIS — N319 Neuromuscular dysfunction of bladder, unspecified: Secondary | ICD-10-CM | POA: Diagnosis not present

## 2015-02-23 DIAGNOSIS — N3289 Other specified disorders of bladder: Secondary | ICD-10-CM | POA: Diagnosis not present

## 2015-02-23 DIAGNOSIS — N319 Neuromuscular dysfunction of bladder, unspecified: Secondary | ICD-10-CM | POA: Diagnosis not present

## 2015-02-25 DIAGNOSIS — N319 Neuromuscular dysfunction of bladder, unspecified: Secondary | ICD-10-CM | POA: Diagnosis not present

## 2015-02-25 DIAGNOSIS — N3289 Other specified disorders of bladder: Secondary | ICD-10-CM | POA: Diagnosis not present

## 2015-02-27 DIAGNOSIS — N319 Neuromuscular dysfunction of bladder, unspecified: Secondary | ICD-10-CM | POA: Diagnosis not present

## 2015-02-27 DIAGNOSIS — N3289 Other specified disorders of bladder: Secondary | ICD-10-CM | POA: Diagnosis not present

## 2015-03-02 DIAGNOSIS — N3289 Other specified disorders of bladder: Secondary | ICD-10-CM | POA: Diagnosis not present

## 2015-03-02 DIAGNOSIS — N319 Neuromuscular dysfunction of bladder, unspecified: Secondary | ICD-10-CM | POA: Diagnosis not present

## 2015-03-03 DIAGNOSIS — Z87891 Personal history of nicotine dependence: Secondary | ICD-10-CM | POA: Diagnosis not present

## 2015-03-03 DIAGNOSIS — R339 Retention of urine, unspecified: Secondary | ICD-10-CM | POA: Diagnosis not present

## 2015-03-03 DIAGNOSIS — R39198 Other difficulties with micturition: Secondary | ICD-10-CM | POA: Diagnosis not present

## 2015-03-03 DIAGNOSIS — N319 Neuromuscular dysfunction of bladder, unspecified: Secondary | ICD-10-CM | POA: Diagnosis not present

## 2015-03-03 DIAGNOSIS — R3989 Other symptoms and signs involving the genitourinary system: Secondary | ICD-10-CM | POA: Diagnosis not present

## 2015-03-03 DIAGNOSIS — Z87440 Personal history of urinary (tract) infections: Secondary | ICD-10-CM | POA: Diagnosis not present

## 2015-03-03 DIAGNOSIS — N390 Urinary tract infection, site not specified: Secondary | ICD-10-CM | POA: Diagnosis not present

## 2015-03-03 DIAGNOSIS — Z79899 Other long term (current) drug therapy: Secondary | ICD-10-CM | POA: Diagnosis not present

## 2015-03-03 DIAGNOSIS — N3644 Muscular disorders of urethra: Secondary | ICD-10-CM | POA: Diagnosis not present

## 2015-03-08 ENCOUNTER — Encounter (HOSPITAL_COMMUNITY): Payer: Self-pay | Admitting: Emergency Medicine

## 2015-03-08 ENCOUNTER — Emergency Department (HOSPITAL_COMMUNITY): Payer: Medicare Other

## 2015-03-08 ENCOUNTER — Emergency Department (HOSPITAL_COMMUNITY)
Admission: EM | Admit: 2015-03-08 | Discharge: 2015-03-08 | Disposition: A | Discharge status: Home / Self Care | Payer: Medicare Other | Attending: Emergency Medicine | Admitting: Emergency Medicine

## 2015-03-08 DIAGNOSIS — N3289 Other specified disorders of bladder: Secondary | ICD-10-CM | POA: Insufficient documentation

## 2015-03-08 DIAGNOSIS — Z87440 Personal history of urinary (tract) infections: Secondary | ICD-10-CM | POA: Insufficient documentation

## 2015-03-08 DIAGNOSIS — Z8669 Personal history of other diseases of the nervous system and sense organs: Secondary | ICD-10-CM | POA: Diagnosis not present

## 2015-03-08 DIAGNOSIS — Z8619 Personal history of other infectious and parasitic diseases: Secondary | ICD-10-CM | POA: Diagnosis not present

## 2015-03-08 DIAGNOSIS — Z8719 Personal history of other diseases of the digestive system: Secondary | ICD-10-CM | POA: Insufficient documentation

## 2015-03-08 DIAGNOSIS — E039 Hypothyroidism, unspecified: Secondary | ICD-10-CM | POA: Insufficient documentation

## 2015-03-08 DIAGNOSIS — R109 Unspecified abdominal pain: Secondary | ICD-10-CM

## 2015-03-08 DIAGNOSIS — E669 Obesity, unspecified: Secondary | ICD-10-CM | POA: Insufficient documentation

## 2015-03-08 DIAGNOSIS — Z87891 Personal history of nicotine dependence: Secondary | ICD-10-CM | POA: Diagnosis not present

## 2015-03-08 DIAGNOSIS — F329 Major depressive disorder, single episode, unspecified: Secondary | ICD-10-CM | POA: Diagnosis not present

## 2015-03-08 DIAGNOSIS — R197 Diarrhea, unspecified: Secondary | ICD-10-CM | POA: Insufficient documentation

## 2015-03-08 DIAGNOSIS — Z8614 Personal history of Methicillin resistant Staphylococcus aureus infection: Secondary | ICD-10-CM | POA: Insufficient documentation

## 2015-03-08 DIAGNOSIS — N400 Enlarged prostate without lower urinary tract symptoms: Secondary | ICD-10-CM | POA: Insufficient documentation

## 2015-03-08 DIAGNOSIS — R1030 Lower abdominal pain, unspecified: Secondary | ICD-10-CM | POA: Insufficient documentation

## 2015-03-08 DIAGNOSIS — I10 Essential (primary) hypertension: Secondary | ICD-10-CM | POA: Insufficient documentation

## 2015-03-08 DIAGNOSIS — Z79899 Other long term (current) drug therapy: Secondary | ICD-10-CM | POA: Diagnosis not present

## 2015-03-08 HISTORY — DX: Neuromuscular dysfunction of bladder, unspecified: N31.9

## 2015-03-08 HISTORY — DX: Other specified disorders of bladder: N32.89

## 2015-03-08 LAB — CBC WITH DIFFERENTIAL/PLATELET
Basophils Absolute: 0.1 10*3/uL (ref 0.0–0.1)
Basophils Relative: 1 %
Eosinophils Absolute: 0.1 10*3/uL (ref 0.0–0.7)
Eosinophils Relative: 1 %
HEMATOCRIT: 33.6 % — AB (ref 39.0–52.0)
Hemoglobin: 11.6 g/dL — ABNORMAL LOW (ref 13.0–17.0)
LYMPHS PCT: 19 %
Lymphs Abs: 1.1 10*3/uL (ref 0.7–4.0)
MCH: 28.4 pg (ref 26.0–34.0)
MCHC: 34.5 g/dL (ref 30.0–36.0)
MCV: 82.2 fL (ref 78.0–100.0)
MONO ABS: 0.3 10*3/uL (ref 0.1–1.0)
MONOS PCT: 6 %
NEUTROS ABS: 4.1 10*3/uL (ref 1.7–7.7)
Neutrophils Relative %: 73 %
Platelets: 215 10*3/uL (ref 150–400)
RBC: 4.09 MIL/uL — ABNORMAL LOW (ref 4.22–5.81)
RDW: 12.8 % (ref 11.5–15.5)
WBC: 5.7 10*3/uL (ref 4.0–10.5)

## 2015-03-08 LAB — URINALYSIS, ROUTINE W REFLEX MICROSCOPIC
Bilirubin Urine: NEGATIVE
GLUCOSE, UA: NEGATIVE mg/dL
Ketones, ur: NEGATIVE mg/dL
Leukocytes, UA: NEGATIVE
Nitrite: NEGATIVE
PH: 6.5 (ref 5.0–8.0)
Protein, ur: NEGATIVE mg/dL
Specific Gravity, Urine: 1.015 (ref 1.005–1.030)

## 2015-03-08 LAB — URINE MICROSCOPIC-ADD ON
Bacteria, UA: NONE SEEN
Squamous Epithelial / LPF: NONE SEEN

## 2015-03-08 LAB — COMPREHENSIVE METABOLIC PANEL
ALT: 11 U/L — ABNORMAL LOW (ref 17–63)
ANION GAP: 5 (ref 5–15)
AST: 13 U/L — AB (ref 15–41)
Albumin: 3.9 g/dL (ref 3.5–5.0)
Alkaline Phosphatase: 75 U/L (ref 38–126)
BILIRUBIN TOTAL: 0.5 mg/dL (ref 0.3–1.2)
BUN: 20 mg/dL (ref 6–20)
CALCIUM: 9.9 mg/dL (ref 8.9–10.3)
CO2: 29 mmol/L (ref 22–32)
Chloride: 106 mmol/L (ref 101–111)
Creatinine, Ser: 1.19 mg/dL (ref 0.61–1.24)
GFR calc Af Amer: >60 mL/min (ref >60)
Glucose, Bld: 101 mg/dL — ABNORMAL HIGH (ref 65–99)
POTASSIUM: 4.4 mmol/L (ref 3.5–5.1)
Sodium: 140 mmol/L (ref 135–145)
TOTAL PROTEIN: 7.1 g/dL (ref 6.5–8.1)

## 2015-03-08 LAB — I-STAT CG4 LACTIC ACID, ED: LACTIC ACID, VENOUS: 0.98 mmol/L (ref 0.5–2.0)

## 2015-03-08 MED ORDER — OXYBUTYNIN CHLORIDE 5 MG PO TABS: 5.0000 mg | ORAL_TABLET | Freq: Three times a day (TID) | ORAL | Status: DC

## 2015-03-08 MED ORDER — PROMETHAZINE HCL 25 MG RE SUPP: 25.0000 mg | Freq: Four times a day (QID) | RECTAL | Status: DC | PRN

## 2015-03-08 MED ORDER — IOHEXOL 300 MG/ML  SOLN
100.0000 mL | Freq: Once | INTRAMUSCULAR | Status: AC | PRN
  Administered 2015-03-08: 100 mL via INTRAVENOUS

## 2015-03-08 MED ORDER — SODIUM CHLORIDE 0.9 % IV SOLN
INTRAVENOUS | Status: DC
  Administered 2015-03-08: 16:00:00 via INTRAVENOUS

## 2015-03-08 MED ORDER — FENTANYL CITRATE (PF) 100 MCG/2ML IJ SOLN
25.0000 ug | INTRAMUSCULAR | Status: DC | PRN
  Administered 2015-03-08: 25 ug via INTRAVENOUS
  Filled 2015-03-08: qty 2

## 2015-03-08 MED ORDER — DICYCLOMINE HCL 10 MG/ML IM SOLN
20.0000 mg | Freq: Once | INTRAMUSCULAR | Status: AC
  Administered 2015-03-08: 20 mg via INTRAMUSCULAR
  Filled 2015-03-08: qty 2

## 2015-03-08 MED ORDER — OXYBUTYNIN CHLORIDE 5 MG PO TABS: ORAL_TABLET | ORAL | Status: DC

## 2015-03-08 NOTE — ED Notes (Signed)
Patient c/o lower abd pain with nausea and diarrhea. Denies any fevers or vomiting. Patient reports bladder problems in which he goes to [facility]. Patient had suprapubic cath removed Tuesday and was called by [facility] told he had UTI and given antibiotics. Patient reports that he is having to self cath now. Per patient pain was original alleviated with self-cathing but is no longer helping with pain.

## 2015-03-08 NOTE — ED Notes (Signed)
Patient requesting pain medication, Thurnell Garbe made aware

## 2015-03-08 NOTE — ED Provider Notes (Signed)
CSN: [id number]     Arrival date & time 03/08/15  1212 History   First MD Initiated Contact with Patient 03/08/15 1507     Chief Complaint  Patient presents with  . Abdominal Pain      HPI  Pt was seen at 1520.  Per pt, c/o gradual onset and persistence of constant suprapubic abd "pain" for the past 2 days.  Has been associated with multiple intermittent episodes of diarrhea.  Describes the abd pain as "cramping." States he is being tx for UTI by his Uro MD at [facility] after being evaluated in their office on Tuesday; also had his suprapubic catheter removed due to frequent UTI's. Pt states he was started on an antibiotic on Thursday (3 days ago). Pt has hx of bladder spasms, but he does not take his ditropan regularly. States he "took a dose" over the past few days "and it didn't work." Denies N/V, no fevers, no back pain, no rash, no CP/SOB, no black or blood in stools, no testicular pain/swelling.     Uro: Dr. Juanda Crumble Scales ([facility]) Past Medical History  Diagnosis Date  . Hyperlipidemia   . MRSA (methicillin resistant staph aureus) culture positive   . Hypertension   . Hypothyroidism   . Depression   . Liver disease   . Chronic hepatitis C ([place])   . Sleep apnea   . Obesity   . GERD (gastroesophageal reflux disease)   . BPH (benign prostatic hypertrophy)   . C. difficile diarrhea   . Arthritis   . Blood transfusion without reported diagnosis   . Substance abuse     pain medication  . Hepatitis C   . Neurogenic bladder   . Bladder spasms    Past Surgical History  Procedure Laterality Date  . Spine surgery      due to mrsa  . Medial partial knee replacement Right   . Lumbar epidural injection    . Lumbar disc surgery    . Suprapubic catheter insertion     Family History  Problem Relation Age of Onset  . Ovarian cancer Mother   . Colon cancer Mother     dx age 53  . AAA (abdominal aortic aneurysm) Father   . Esophageal cancer Neg Hx   . Kidney disease Neg Hx   . Liver  disease Neg Hx    Social History  Substance Use Topics  . Smoking status: Former Smoker    Types: Cigarettes    Quit date: 03/21/1994  . Smokeless tobacco: Never Used  . Alcohol Use: No    Review of Systems ROS: Statement: All systems negative except as marked or noted in the HPI; Constitutional: Negative for fever and chills. ; ; Eyes: Negative for eye pain, redness and discharge. ; ; ENMT: Negative for ear pain, hoarseness, nasal congestion, sinus pressure and sore throat. ; ; Cardiovascular: Negative for chest pain, palpitations, diaphoresis, dyspnea and peripheral edema. ; ; Respiratory: Negative for cough, wheezing and stridor. ; ; Gastrointestinal: +N/V/D, abd pain. Negative for blood in stool, hematemesis, jaundice and rectal bleeding. . ; ; Genitourinary: Negative for dysuria, flank pain and hematuria. ; ; Genital:  No penile drainage or rash, no testicular pain or swelling, no scrotal rash or swelling. ;; Musculoskeletal: Negative for back pain and neck pain. Negative for swelling and trauma.; ; Skin: Negative for pruritus, rash, abrasions, blisters, bruising and skin lesion.; ; Neuro: Negative for headache, lightheadedness and neck stiffness. Negative for weakness, altered level of consciousness ,  altered mental status, extremity weakness, paresthesias, involuntary movement, seizure and syncope.      Allergies  Hydrocodone  Home Medications   Prior to Admission medications   Medication Sig Start Date End Date Taking? Authorizing Provider  dicyclomine (BENTYL) 20 MG tablet Take 1 tablet (20 mg total) by mouth 2 (two) times daily as needed for spasms. 01/14/15   Davonna Belling, MD  DIGESTIVE ENZYMES PO Take 2 tablets by mouth.    Historical Provider, MD  HARVONI 90-400 MG TABS Take 1 tablet by mouth every evening. 01/06/15   Historical Provider, MD  levothyroxine (SYNTHROID, LEVOTHROID) 125 MCG tablet TAKE 1 TABLET BY MOUTH EVERY DAY NOTE DOSAGE CHANGE Patient taking differently:  TAKE 1 TABLET BY MOUTH EVERY DAY 07/22/14   Chipper Herb, MD  LORazepam (ATIVAN) 1 MG tablet Take 1 tablet (1 mg total) by mouth 2 (two) times daily as needed for anxiety (spasm). 01/19/15   Sharion Balloon, FNP  ondansetron (ZOFRAN) 4 MG tablet Take 1 tablet (4 mg total) by mouth every 8 (eight) hours as needed for nausea or vomiting. 01/19/15   Sharion Balloon, FNP  oxybutynin (DITROPAN) 5 MG tablet take 1 tablet by mouth three times a day 01/20/15   Sharion Balloon, FNP  SUBOXONE 12-3 MG FILM Take 1 Film by mouth daily.  04/24/14   Historical Provider, MD  tamsulosin (FLOMAX) 0.4 MG CAPS capsule Take 0.4 mg by mouth 2 (two) times daily.  01/31/14   Historical Provider, MD   BP 133/91 mmHg  Pulse 75  Temp(Src) 98.2 F (36.8 C) (Oral)  Resp 20  Ht 6' (1.829 m)  Wt 240 lb (108.863 kg)  BMI 32.54 kg/m2  SpO2 97% Physical Exam  1525: Physical examination:  Nursing notes reviewed; Vital signs and O2 SAT reviewed;  Constitutional: Well developed, Well nourished, Well hydrated, In no acute distress; Head:  Normocephalic, atraumatic; Eyes: EOMI, PERRL, No scleral icterus; ENMT: Mouth and pharynx normal, Mucous membranes moist; Neck: Supple, Full range of motion, No lymphadenopathy; Cardiovascular: Regular rate and rhythm, No gallop; Respiratory: Breath sounds clear & equal bilaterally, No wheezes.  Speaking full sentences with ease, Normal respiratory effort/excursion; Chest: Nontender, Movement normal; Abdomen: Soft, +mild suprapubic tenderness to palp. No rebound or guarding. Nondistended, Normal bowel sounds; Genitourinary: No CVA tenderness; Extremities: Pulses normal, No tenderness, No edema, No calf edema or asymmetry.; Neuro: AA&Ox3, Major CN grossly intact.  Speech clear. No gross focal motor or sensory deficits in extremities.; Skin: Color normal, Warm, Dry.   ED Course  Procedures (including critical care time) Labs Review   Imaging Review  I have personally reviewed and evaluated these  images and lab results as part of my medical decision-making.   EKG Interpretation None      MDM  MDM Reviewed: previous chart, nursing note and vitals Reviewed previous: labs Interpretation: labs and CT scan   Results for orders placed or performed during the hospital encounter of 03/08/15  Urinalysis, Routine w reflex microscopic (not at [facility])  Result Value Ref Range   Color, Urine YELLOW YELLOW   APPearance CLEAR CLEAR   Specific Gravity, Urine 1.015 1.005 - 1.030   pH 6.5 5.0 - 8.0   Glucose, UA NEGATIVE NEGATIVE mg/dL   Hgb urine dipstick SMALL (A) NEGATIVE   Bilirubin Urine NEGATIVE NEGATIVE   Ketones, ur NEGATIVE NEGATIVE mg/dL   Protein, ur NEGATIVE NEGATIVE mg/dL   Nitrite NEGATIVE NEGATIVE   Leukocytes, UA NEGATIVE NEGATIVE  CBC with Differential  Result Value Ref Range   WBC 5.7 4.0 - 10.5 K/uL   RBC 4.09 (L) 4.22 - 5.81 MIL/uL   Hemoglobin 11.6 (L) 13.0 - 17.0 g/dL   HCT 33.6 (L) 39.0 - 52.0 %   MCV 82.2 78.0 - 100.0 fL   MCH 28.4 26.0 - 34.0 pg   MCHC 34.5 30.0 - 36.0 g/dL   RDW 12.8 11.5 - 15.5 %   Platelets 215 150 - 400 K/uL   Neutrophils Relative % 73 %   Neutro Abs 4.1 1.7 - 7.7 K/uL   Lymphocytes Relative 19 %   Lymphs Abs 1.1 0.7 - 4.0 K/uL   Monocytes Relative 6 %   Monocytes Absolute 0.3 0.1 - 1.0 K/uL   Eosinophils Relative 1 %   Eosinophils Absolute 0.1 0.0 - 0.7 K/uL   Basophils Relative 1 %   Basophils Absolute 0.1 0.0 - 0.1 K/uL  Comprehensive metabolic panel  Result Value Ref Range   Sodium 140 135 - 145 mmol/L   Potassium 4.4 3.5 - 5.1 mmol/L   Chloride 106 101 - 111 mmol/L   CO2 29 22 - 32 mmol/L   Glucose, Bld 101 (H) 65 - 99 mg/dL   BUN 20 6 - 20 mg/dL   Creatinine, Ser 1.19 0.61 - 1.24 mg/dL   Calcium 9.9 8.9 - 10.3 mg/dL   Total Protein 7.1 6.5 - 8.1 g/dL   Albumin 3.9 3.5 - 5.0 g/dL   AST 13 (L) 15 - 41 U/L   ALT 11 (L) 17 - 63 U/L   Alkaline Phosphatase 75 38 - 126 U/L   Total Bilirubin 0.5 0.3 - 1.2 mg/dL   GFR calc  non Af Amer >60 >60 mL/min   GFR calc Af Amer >60 >60 mL/min   Anion gap 5 5 - 15  Urine microscopic-add on  Result Value Ref Range   Squamous Epithelial / LPF NONE SEEN NONE SEEN   WBC, UA 0-5 0 - 5 WBC/hpf   RBC / HPF 6-30 0 - 5 RBC/hpf   Bacteria, UA NONE SEEN NONE SEEN  I-Stat CG4 Lactic Acid, ED  Result Value Ref Range   Lactic Acid, Venous 0.98 0.5 - 2.0 mmol/L   Ct Abdomen Pelvis W Contrast 03/08/2015  CLINICAL DATA:  69 year old male with lower back pain x2 days, nausea, diarrhea. Recent history of UTI. EXAM: CT ABDOMEN AND PELVIS WITH CONTRAST TECHNIQUE: Multidetector CT imaging of the abdomen and pelvis was performed using the standard protocol following bolus administration of intravenous contrast. CONTRAST:  152mL OMNIPAQUE IOHEXOL 300 MG/ML  SOLN COMPARISON:  CT dated 01/02/2015 FINDINGS: Minimal bibasilar dependent atelectatic changes. The visualized lung bases are otherwise clear. No intra-abdominal free air or free fluid identified. Stop the liver, gallbladder, pancreas, spleen, and adrenal glands appear unremarkable. There is a grossly stable appearing right renal cyst measuring approximately 4.5 cm. Subcentimeter bilateral renal hypodense lesions are too small to characterize. There is no hydronephrosis on either side. The visualized ureters and urinary bladder appear unremarkable. The prostate and seminal vesicles are grossly unremarkable. There is moderate stool throughout the colon. No evidence of bowel obstruction or inflammation. Normal appendix. The abdominal aorta and IVC appear patent. No portal venous gas identified. There is no lymphadenopathy. There is multilevel degenerative changes of spine with multilevel thoracolumbar laminectomy. L2-S1 posterior fixation screws. No acute fracture. IMPRESSION: No acute intra-abdominal pelvic pathology. Extensive degenerative changes of the lumbar spine as well as stable appearing lumbar laminectomy and posterior fixation screws.  Electronically Signed   By: Anner Crete M.D.   On: 03/08/2015 18:39    1530:  Bladder scan with 1ml (post I&O cath).   1930:  Workup reassuring. Pt has tol PO well while in the ED without N/V. No stooling while in the ED. T/C to [facility]rology Dr. Para March, case discussed, including:  HPI, pertinent PM/SHx, VS/PE, dx testing, ED course and treatment:  Agrees with ED workup, states to have pt take ditropan regularly (not prn) every 8 hours but if he feels his symptoms recur sooner than 8 hours, pt make take it q6h; have pt f/u ofc this week. Dx and testing, as well as d/w Uro MD, d/w pt and family.  Questions answered.  Verb understanding, agreeable to d/c home with outpt f/u. Pt requesting refill of his rx phenergan PR.      Francine Graven, DO 03/11/15 1920

## 2015-03-08 NOTE — Discharge Instructions (Signed)
°Emergency Department Resource Guide °1) Find a Doctor and Pay Out of Pocket °Although you won't have to find out who is covered by your insurance plan, it is a good idea to ask around and get recommendations. You will then need to call the office and see if the doctor you have chosen will accept you as a new patient and what types of options they offer for patients who are self-pay. Some doctors offer discounts or will set up payment plans for their patients who do not have insurance, but you will need to ask so you aren't surprised when you get to your appointment. ° °2) Contact Your Local Health Department °Not all health departments have doctors that can see patients for sick visits, but many do, so it is worth a call to see if yours does. If you don't know where your local health department is, you can check in your phone book. The CDC also has a tool to help you locate your state's health department, and many state websites also have listings of all of their local health departments. ° °3) Find a Walk-in Clinic °If your illness is not likely to be very severe or complicated, you may want to try a walk in clinic. These are popping up all over the country in pharmacies, drugstores, and shopping centers. They're usually staffed by nurse practitioners or physician assistants that have been trained to treat common illnesses and complaints. They're usually fairly quick and inexpensive. However, if you have serious medical issues or chronic medical problems, these are probably not your best option. ° °No Primary Care Doctor: °- Call [facility] at  832-8000 - they can help you locate a primary care doctor that  accepts your insurance, provides certain services, etc. °- Physician Referral Service- 1-800-533-3463 ° °Chronic Pain Problems: °Organization         Address  Phone   Notes  °[facility]  ([phone]) [phone] Patients need to be referred by their primary care doctor.  ° °Medication  Assistance: °Organization         Address  Phone   Notes  °[place] [facility] [address] °[place], [place] [address] (336) 641-8030 --Must be a resident of [place] °-- Must have NO insurance coverage whatsoever (no Medicaid/ Medicare, etc.) °-- The pt. MUST have a primary care doctor that directs their care regularly and follows them in the community °  °[facility]  (866) 331-1348   °[employer]  (888) 892-1162   ° °Agencies that provide inexpensive medical care: °Organization         Address  Phone   Notes  °[facility]  (336) 832-8035   °[facility]    (336) 832-7272   °[facility] [address] °[place], [place] [address] (336) 832-4777   °[facility] [address], °[place] (336) 271-4999   °[facility]    (336) 373-0678   °[facility]    (336) 272-1050   °[facility] ° [address], [place] Phone:  (336) 832-4444, Fax:  (336) 832-4440 Hours of Operation:  9 am - 6 pm, M-F.  Also accepts Medicaid/Medicare and self-pay.  °[facility] ° [address], [place] Phone: (336) 832-3150, Fax: (336) [phone]. Hours of Operation:  8:30 am - 5:30 pm, M-F.  Also accepts Medicaid and self-pay.  °[facility] [place] [address], [place] Phone: (336) 878-6027   °[facility] [address], [place], [place] (336)723-1848, Ext. 123 Mondays & Thursdays: 7-9 AM.  First 15 patients are seen on a first come, first serve basis. °  ° °Medicaid-accepting [place] Providers: ° °Organization         Address  Phone   Notes  °[facility] [address], [place] (336) 641-2100 Also accepts self-pay patients.  °[facility] [address], [place] ° (336) 856-9996   °[facility] [address], [place]  (336) 288-8857   °[facility] [address] [place] [address], [place] (336) 299-7000   °Veita Bland [address], [place]  ° (336) 373-1557 Only accepts [place] Access Medicaid patients after they have their name applied to their card.  ° °Self-Pay (no insurance) in [place]: ° °Organization         Address  Phone   Notes  °Sickle Cell Patients, [facility] [address], [place] (336) 832-1970   °[facility] [address], [place] (336) 832-4400   °[facility] ° [address], [place] (336) 992-4800   °[facility]/Dr. Osei-Bonsu ° [address] [place] [address], [place] or [address], [place] (336) [phone] Phone number for both [place] and [place] locations is the same.  °[facility] [address], [place] (336) 299-0000   °[facility] [address] [place] [address], [place] or [address] (336) 852-7530 °(336) 878-2260   °[facility] [address], [place] (336) 350-1642, phone; (336) 294-5005, fax Sees patients 1st and 3rd Saturday of every month.  Must not qualify for public or private insurance (i.e. Medicaid, Medicare, [place] Health Choice, Veterans' Benefits) • Household income should be no more than 200% of the poverty level •The clinic cannot treat you if you are pregnant or think you are pregnant • Sexually transmitted diseases are not treated at the clinic.  ° ° °Dental Care: °Organization         Address  Phone  Notes  °[place] [facility] [address], [place] (336) 641-6152 Accepts children up to age 21 who are enrolled in Medicaid or [place] Health Choice; pregnant women with a Medicaid card; and children who have applied for Medicaid or [place] Health Choice, but were declined, whose parents can pay a reduced fee at time of service.  °[place]  [facility] [place]  [address], [place] (336) 641-7733 Accepts children up to age 21 who are enrolled in Medicaid or [place] Health Choice; pregnant women with a Medicaid card; and children who have applied for Medicaid or  Health Choice, but were declined, whose parents can pay a reduced fee at time of service.  °[facility] ° [address], [place] (336) 641-4533 Patients are seen by appointment only. Walk-ins are not accepted. [facility] will see patients 18 years of age and older. °Monday - Tuesday (8am-5pm) °Most Wednesdays (8:30-5pm) °$30 per visit, cash only  °[facility] ° [address], [place] (336) 641-4533 Patients are seen by appointment only. Walk-ins are not accepted. [facility] will see patients 18 years of age and older. °One   Wednesday Evening (Monthly: Volunteer Based).  $30 per visit, cash only  °[facility]  (919) 537-3737 for adults; Children under age 4, call Graduate Pediatric Dentistry at ([phone]) [phone]. Children aged 4-14, please call ([phone]) [phone] to request a pediatric application. ° Dental services are provided in all areas of dental care including fillings, crowns and bridges, complete and partial dentures, implants, gum treatment, root canals, and extractions. Preventive care is also provided. Treatment is provided to both adults and children. °Patients are selected via a lottery and there is often a waiting list. °  °[facility] [address], °[place] ° (336) 763-8833 [email or web address].[email or web address] °  °[facility] [address], [place], [place] (336)723-1848, Ext. 123 Second and Fourth Thursday of each month, opens at 6:30 AM; Clinic ends at 9 AM.  Patients are seen on a first-come first-served basis, and a limited number are seen during each clinic.  ° °[facility] ° [address], [place], [place] (336) 723-7904    Eligibility Requirements °You must have lived in [place], [place], or [place] counties for at least the last three months. °  You cannot be eligible for state or federal sponsored healthcare insurance, including Veterans Administration, Medicaid, or Medicare. °  You generally cannot be eligible for healthcare insurance through your employer.  °  How to apply: °Eligibility screenings are held every Tuesday and Wednesday afternoon from 1:00 pm until 4:00 pm. You do not need an appointment for the interview!  °[facility] [address], [place], [place] 336-631-2330   °[facility]  336-342-8273   °[facility]  336-703-3100   °[facility]  336-570-6415   ° °Behavioral Health Resources in the Community: °Intensive Outpatient Programs °Organization         Address  Phone  Notes  °[facility] [address], [place], [place] 336-878-6098   °[facility] [address], [place], [place] 336-832-9800   °[facility] [address], [place], [place] ° 336-882-2125   °[facility] [address],  °[place], [place] 1-800-853-5163 or 336-641-4981   °Substance Abuse Resources °Organization         Address  Phone  Notes  °[facility]  336-882-2125   °[facility]  336-784-9470   °[facility]  336-285-9073   °[facility]  336-845-3988   °Residential & Outpatient Substance Abuse Program  1-800-659-3381   °Psychological Services °Organization         Address  Phone  Notes  °[facility]  336- 832-9600   °[facility]  336- 378-7881   °[facility] [address], [place] 1-800-853-5163 or 336-641-4981   ° °Mobile Crisis Teams °Organization         Address  Phone  Notes  °[facility], Mobile Crisis Care Unit  1-877-626-1772   °Assertive °[facility] ° [address]  [place], [place] 336-834-9664   °Sharon DeEsch [address] °[place]  336-554-5454   ° °Self-Help/Support Groups °Organization         Address  Phone             Notes  °[facility] [place] - variety of support groups  336- 373-1402 Call for more information  °Narcotics Anonymous (NA), [facility] [address], °[place]   2 meetings at this location  ° °  Residential Treatment Programs Organization         Address  Phone  Notes  [facility] [address],    [place]  1-934-482-1892   [facility]  [address] [place] [id number], [place], [place]   [place], [place] [phone] Admissions: 8am-3pm M-F  [facility] [address],    [place], [place] [id number]   [facility] [address] [place], [place], [place]   [facility] [address],  [place], [place]   [facility] Intensive Outpatient [address], [place], [place]   [facility] ([place]  [place], [place] 1-(681)509-0601 or 956-509-0303   [facility] [address], [place], [place] Accepts Medicaid  [facility] [address]  [place] 1-310-245-7448 Substance Abuse/Addiction Treatment   [facility] Organization         Address  Phone  Notes  [facility]  [phone]   Domenic Schwab, PhD [address] [place], [place]   985-545-4937 or 2063499242   [place], [place] [phone]   [facility] [address] [place], [place] [phone] Insurance/Medicaid/sponsorship through [facility] [address]                                    [place], [place] (934)[phone] [place] [address], [place] [phone]    Dr. Adele Schilder  [phone]   [facility] Dept. 1) [address] [place] 2) [place] 3)  [place] [address], [place] [phone]  [phone]   [place] [phone] or [phone] (After Hours)      Take the prescription as directed. Take your oxybutynin 5mg  tablets: one tablet by mouth every 8 hours. If you feel your symptoms are not controlled/or return sooner than 8 hours, can take one tablet every 6 hours. Take this medication regularly until you are seen in follow up by your Urologist. Call your regular medical doctor and your Urologist tomorrow to schedule a follow up appointment within the next 3 days.  Return to the Emergency Department immediately sooner if worsening.

## 2015-03-09 LAB — URINE CULTURE: CULTURE: NO GROWTH

## 2015-03-12 DIAGNOSIS — R109 Unspecified abdominal pain: Secondary | ICD-10-CM | POA: Diagnosis not present

## 2015-03-13 DIAGNOSIS — Z885 Allergy status to narcotic agent status: Secondary | ICD-10-CM | POA: Diagnosis not present

## 2015-03-13 DIAGNOSIS — R109 Unspecified abdominal pain: Secondary | ICD-10-CM | POA: Diagnosis not present

## 2015-03-13 DIAGNOSIS — R1030 Lower abdominal pain, unspecified: Secondary | ICD-10-CM | POA: Diagnosis not present

## 2015-03-13 DIAGNOSIS — K5900 Constipation, unspecified: Secondary | ICD-10-CM | POA: Diagnosis not present

## 2015-03-13 DIAGNOSIS — Z79899 Other long term (current) drug therapy: Secondary | ICD-10-CM | POA: Diagnosis not present

## 2015-03-13 DIAGNOSIS — I159 Secondary hypertension, unspecified: Secondary | ICD-10-CM | POA: Diagnosis not present

## 2015-03-18 ENCOUNTER — Encounter: Payer: Self-pay | Admitting: Family Medicine

## 2015-03-18 ENCOUNTER — Ambulatory Visit (INDEPENDENT_AMBULATORY_CARE_PROVIDER_SITE_OTHER): Payer: Medicare Other

## 2015-03-18 ENCOUNTER — Ambulatory Visit (INDEPENDENT_AMBULATORY_CARE_PROVIDER_SITE_OTHER): Payer: Medicare Other | Admitting: Family Medicine

## 2015-03-18 VITALS — BP 150/92 | HR 97 | Temp 97.7°F | Ht 74.0 in | Wt 254.0 lb

## 2015-03-18 DIAGNOSIS — R112 Nausea with vomiting, unspecified: Secondary | ICD-10-CM | POA: Diagnosis not present

## 2015-03-18 LAB — CBC WITH DIFFERENTIAL/PLATELET
BASOS: 1 %
Basophils Absolute: 0 10*3/uL (ref 0.0–0.2)
EOS (ABSOLUTE): 0.1 10*3/uL (ref 0.0–0.4)
EOS: 3 %
HEMATOCRIT: 34.6 % — AB (ref 37.5–51.0)
HEMOGLOBIN: 12.3 g/dL — AB (ref 12.6–17.7)
LYMPHS: 23 %
Lymphocytes Absolute: 1.1 10*3/uL (ref 0.7–3.1)
MCH: 28.8 pg (ref 26.6–33.0)
MCHC: 35.5 g/dL (ref 31.5–35.7)
MCV: 81 fL (ref 79–97)
Monocytes Absolute: 0.4 10*3/uL (ref 0.1–0.9)
Monocytes: 9 %
NEUTROS PCT: 64 %
Neutrophils Absolute: 2.9 10*3/uL (ref 1.4–7.0)
Platelets: 204 10*3/uL (ref 150–379)
RBC: 4.27 x10E6/uL (ref 4.14–5.80)
RDW: 14 % (ref 12.3–15.4)
WBC: 4.5 10*3/uL (ref 3.4–10.8)

## 2015-03-18 LAB — CMP14+EGFR
ALBUMIN: 4.4 g/dL (ref 3.6–4.8)
ALK PHOS: 86 IU/L (ref 39–117)
ALT: 14 IU/L (ref 0–44)
AST: 15 IU/L (ref 0–40)
Albumin/Globulin Ratio: 1.8 (ref 1.1–2.5)
BUN / CREAT RATIO: 15 (ref 10–22)
BUN: 15 mg/dL (ref 8–27)
Bilirubin Total: 0.3 mg/dL (ref 0.0–1.2)
CO2: 24 mmol/L (ref 18–29)
CREATININE: 1.02 mg/dL (ref 0.76–1.27)
Calcium: 9.2 mg/dL (ref 8.6–10.2)
Chloride: 105 mmol/L (ref 96–106)
GFR, EST AFRICAN AMERICAN: 88 mL/min/{1.73_m2} (ref >59)
GFR, EST NON AFRICAN AMERICAN: 76 mL/min/{1.73_m2} (ref >59)
GLOBULIN, TOTAL: 2.5 g/dL (ref 1.5–4.5)
Glucose: 98 mg/dL (ref 65–99)
Potassium: 4.4 mmol/L (ref 3.5–5.2)
SODIUM: 140 mmol/L (ref 134–144)
TOTAL PROTEIN: 6.9 g/dL (ref 6.0–8.5)

## 2015-03-18 LAB — POCT URINALYSIS DIPSTICK
BILIRUBIN UA: NEGATIVE
Blood, UA: NEGATIVE
GLUCOSE UA: NEGATIVE
KETONES UA: NEGATIVE
LEUKOCYTES UA: NEGATIVE
NITRITE UA: NEGATIVE
Protein, UA: NEGATIVE
Spec Grav, UA: 1.01
Urobilinogen, UA: NEGATIVE
pH, UA: 7.5

## 2015-03-18 LAB — LIPASE: LIPASE: 15 U/L (ref 0–59)

## 2015-03-18 LAB — AMYLASE: Amylase: 44 U/L (ref 31–124)

## 2015-03-18 MED ORDER — ONDANSETRON HCL 8 MG PO TABS: 8.0000 mg | ORAL_TABLET | Freq: Three times a day (TID) | ORAL | Status: DC | PRN

## 2015-03-18 MED ORDER — LINACLOTIDE 290 MCG PO CAPS: 290.0000 ug | ORAL_CAPSULE | Freq: Every day | ORAL | Status: DC

## 2015-03-18 NOTE — Progress Notes (Addendum)
Subjective:  Patient ID: Kenneth Rhodes, male    DOB: 12-19-1945  Age: 69 y.o. MRN: [id number]  CC: Abdominal Pain   HPI Pryor Guettler Wieting presents for .nausea, lower abd pain. Bilateral flank region. Onset 9 days ago. Described as a cramping sensation that is fairly constant waxing and waning though. 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 Since that time he has had poor control of his bladder and has had a suprapubic catheter until 2 weeks ago. He is learning to self catheterize due to loss of ability to urinate freely. He is using tamsulosin and oxybutynin for that. He is self catheterizing with a clean catheter each time using proper technique. He was taken off of doxycycline 2 weeks ago due to his urinary specimen and symptoms being clear.  Pt. Feels the problem is stool related as he has been having to use miralax (OTC) daily and only getting a small amount of liquidy stool. He feels bloated.This has been increasing for two weeks, but he has had opiate -related constipation in the past as well.  History.  Curren has a past medical history of Hyperlipidemia; MRSA (methicillin resistant staph aureus) culture positive; Hypertension; Hypothyroidism; Depression; Liver disease; Chronic hepatitis C ([place]); Sleep apnea; Obesity; GERD (gastroesophageal reflux disease); BPH (benign prostatic hypertrophy); C. difficile diarrhea; Arthritis; Blood transfusion without reported diagnosis; Substance abuse; Hepatitis C; Neurogenic bladder; and Bladder spasms.   He has past surgical history that includes Spine surgery; Medial partial knee replacement (Right); Lumbar epidural injection; Lumbar disc surgery; and Suprapubic catheter insertion.   His family history includes AAA (abdominal aortic aneurysm) in his father; Colon cancer in his mother; Ovarian cancer in his mother. There is no history of  Esophageal cancer, Kidney disease, or Liver disease.He reports that he quit smoking about 21 years ago. His smoking use included Cigarettes. He has never used smokeless tobacco. He reports that he does not drink alcohol or use illicit drugs.  Outpatient Prescriptions Prior to Visit  Medication Sig Dispense Refill  . DIGESTIVE ENZYMES PO Take 2 tablets by mouth.    Marland Kitchen HARVONI 90-400 MG TABS Take 1 tablet by mouth every evening.  2  . levothyroxine (SYNTHROID, LEVOTHROID) 125 MCG tablet TAKE 1 TABLET BY MOUTH EVERY DAY NOTE DOSAGE CHANGE (Patient taking differently: TAKE 1 TABLET BY MOUTH EVERY DAY AT BEDTIME) 90 tablet 2  . oxybutynin (DITROPAN) 5 MG tablet 1 tablet by mouth every 6 to 8 hours 15 tablet 0  . SUBOXONE 12-3 MG FILM Take 1 Film by mouth daily.   0  . tamsulosin (FLOMAX) 0.4 MG CAPS capsule Take 0.4 mg by mouth 2 (two) times daily.   0  . dicyclomine (BENTYL) 20 MG tablet Take 1 tablet (20 mg total) by mouth 2 (two) times daily as needed for spasms. 10 tablet 0  . LORazepam (ATIVAN) 0.5 MG tablet Take 0.5 mg by mouth 2 (two) times daily as needed for anxiety.    Marland Kitchen LORazepam (ATIVAN) 1 MG tablet Take 1 tablet (1 mg total) by mouth 2 (two) times daily as needed for anxiety (spasm). 60 tablet 2  . naproxen sodium (ALEVE) 220 MG tablet Take 440 mg by mouth 2 (two) times daily as needed (Pain).    Marland Kitchen doxycycline (VIBRA-TABS) 100 MG tablet Take 100 mg by mouth 2 (two) times daily.    . ondansetron (ZOFRAN) 4 MG tablet Take 1  tablet (4 mg total) by mouth every 8 (eight) hours as needed for nausea or vomiting. (Patient not taking: Reported on 03/18/2015) 60 tablet 3  . promethazine (PHENERGAN) 25 MG suppository Place 1 suppository (25 mg total) rectally every 6 (six) hours as needed for nausea or vomiting. (Patient not taking: Reported on 03/18/2015) 6 each 0  . promethazine (PHENERGAN) 25 MG tablet Take 25 mg by mouth every 6 (six) hours as needed for nausea or vomiting. Reported on 03/18/2015       Facility-Administered Medications Prior to Visit  Medication Dose Route Frequency Provider Last Rate Last Dose  . methylPREDNISolone acetate (DEPO-MEDROL) injection 80 mg  80 mg Intramuscular Once Lysbeth Penner, FNP        ROS Review of Systems  Constitutional: Negative for fever, chills, diaphoresis and unexpected weight change.  HENT: Negative for rhinorrhea and trouble swallowing.   Respiratory: Negative for cough, chest tightness and shortness of breath.   Cardiovascular: Negative for chest pain.  Gastrointestinal: Positive for nausea, abdominal pain, constipation (Pt. states not completely stopped up but stools are small and loose.) and abdominal distention. Negative for vomiting and blood in stool.  Genitourinary: Positive for flank pain. Negative for hematuria.       See HPI   Musculoskeletal: Negative for joint swelling and arthralgias.  Skin: Negative for rash.  Neurological: Negative for syncope and headaches.    Objective:  BP 150/92 mmHg  Pulse 97  Temp(Src) 97.7 F (36.5 C) (Oral)  Ht _0  (1.88 m)  Wt 254 lb (115.214 kg)  BMI 32.60 kg/m2  SpO2 97%  BP Readings from Last 3 Encounters:  03/18/15 150/92  03/08/15 124/87  01/19/15 139/87    Wt Readings from Last 3 Encounters:  03/18/15 254 lb (115.214 kg)  03/08/15 240 lb (108.863 kg)  01/19/15 259 lb 6.4 oz (117.663 kg)     Physical Exam  Constitutional: He is oriented to person, place, and time. He appears well-developed and well-nourished. No distress.  HENT:  Head: Normocephalic and atraumatic.  Right Ear: External ear normal.  Left Ear: External ear normal.  Nose: Nose normal.  Mouth/Throat: Oropharynx is clear and moist.  Eyes: Conjunctivae and EOM are normal. Pupils are equal, round, and reactive to light.  Neck: Normal range of motion. Neck supple. No thyromegaly present.  Cardiovascular: Normal rate, regular rhythm and normal heart sounds.   No murmur heard. Pulmonary/Chest: Effort  normal and breath sounds normal. No respiratory distress. He has no wheezes. He has no rales.  Abdominal: Soft. Bowel sounds are normal. He exhibits distension. He exhibits no mass. There is tenderness (difusse, mild). There is no rebound and no guarding.  Musculoskeletal: He exhibits no edema or tenderness.  Gait is slow, due to previous spinal illness  Lymphadenopathy:    He has no cervical adenopathy.  Neurological: He is alert and oriented to person, place, and time. He has normal reflexes.  Skin: Skin is warm and dry. No rash noted. No pallor.  Psychiatric: He has a normal mood and affect. His behavior is normal. Judgment and thought content normal.     Lab Results  Component Value Date   WBC 4.5 03/18/2015   HGB 11.6* 03/08/2015   HCT 34.6* 03/18/2015   PLT 215 03/08/2015   GLUCOSE 98 03/18/2015   CHOL 107 04/02/2014   TRIG 92 04/02/2014   HDL 47 04/02/2014   LDLCALC 42 04/02/2014   ALT 14 03/18/2015   AST 15 03/18/2015   NA  140 03/18/2015   K 4.4 03/18/2015   CL 105 03/18/2015   CREATININE 1.02 03/18/2015   BUN 15 03/18/2015   CO2 24 03/18/2015   TSH 2.690 10/17/2014   INR 1.73* 03/29/2014    Ct Abdomen Pelvis W Contrast  03/08/2015  CLINICAL DATA:  69 year old male with lower back pain x2 days, nausea, diarrhea. Recent history of UTI. EXAM: CT ABDOMEN AND PELVIS WITH CONTRAST TECHNIQUE: Multidetector CT imaging of the abdomen and pelvis was performed using the standard protocol following bolus administration of intravenous contrast. CONTRAST:  114m OMNIPAQUE IOHEXOL 300 MG/ML  SOLN COMPARISON:  CT dated 01/02/2015 FINDINGS: Minimal bibasilar dependent atelectatic changes. The visualized lung bases are otherwise clear. No intra-abdominal free air or free fluid identified. Stop the liver, gallbladder, pancreas, spleen, and adrenal glands appear unremarkable. There is a grossly stable appearing right renal cyst measuring approximately 4.5 cm. Subcentimeter bilateral renal  hypodense lesions are too small to characterize. There is no hydronephrosis on either side. The visualized ureters and urinary bladder appear unremarkable. The prostate and seminal vesicles are grossly unremarkable. There is moderate stool throughout the colon. No evidence of bowel obstruction or inflammation. Normal appendix. The abdominal aorta and IVC appear patent. No portal venous gas identified. There is no lymphadenopathy. There is multilevel degenerative changes of spine with multilevel thoracolumbar laminectomy. L2-S1 posterior fixation screws. No acute fracture. IMPRESSION: No acute intra-abdominal pelvic pathology. Extensive degenerative changes of the lumbar spine as well as stable appearing lumbar laminectomy and posterior fixation screws. Electronically Signed   By: AAnner CreteM.D.   On: 03/08/2015 18:39   ABD XR - increased stool and gas. No sign of obstruction.  Assessment & Plan:   WNaodwas seen today for abdominal pain.  Diagnoses and all orders for this visit:  Non-intractable vomiting with nausea, vomiting of unspecified type -     ondansetron (ZOFRAN) 8 MG tablet; Take 1 tablet (8 mg total) by mouth every 8 (eight) hours as needed for nausea or vomiting. -     DG Abd 2 Views; Future -     Amylase -     CBC with Differential/Platelet -     CMP14+EGFR -     Lipase -     POCT urinalysis dipstick -     GI Profile, Stool, PCR -     Clostridium difficile EIA  Other orders -     Linaclotide (LINZESS) 290 MCG CAPS capsule; Take 1 capsule (290 mcg total) by mouth daily.   I have discontinued Mr. Bocchino's dicyclomine, LORazepam, doxycycline, promethazine, LORazepam, naproxen sodium, and promethazine. I have also changed his ondansetron. Additionally, I am having him start on Linaclotide. Lastly, I am having him maintain his tamsulosin, SUBOXONE, levothyroxine, HARVONI, DIGESTIVE ENZYMES PO, and oxybutynin. We will continue to administer methylPREDNISolone  acetate.  Meds ordered this encounter  Medications  . ondansetron (ZOFRAN) 8 MG tablet    Sig: Take 1 tablet (8 mg total) by mouth every 8 (eight) hours as needed for nausea or vomiting.    Dispense:  50 tablet    Refill:  5  . Linaclotide (LINZESS) 290 MCG CAPS capsule    Sig: Take 1 capsule (290 mcg total) by mouth daily.    Dispense:  30 capsule    Refill:  2    Follow-up: Return in about 6 months (around 09/16/2015), or if symptoms worsen or fail to improve, for Pain in abdomen.  WClaretta Fraise M.D.

## 2015-03-18 NOTE — Addendum Note (Signed)
Addended by: Claretta Fraise on: 03/18/2015 12:28 PM   Modules accepted: Orders

## 2015-03-19 ENCOUNTER — Telehealth: Payer: Self-pay

## 2015-03-19 ENCOUNTER — Other Ambulatory Visit: Payer: Medicare Other

## 2015-03-19 DIAGNOSIS — R112 Nausea with vomiting, unspecified: Secondary | ICD-10-CM | POA: Diagnosis not present

## 2015-03-19 NOTE — Telephone Encounter (Signed)
Please let them know that he has already tried and failed polyethylene glcol (miralax.)

## 2015-03-19 NOTE — Telephone Encounter (Signed)
x

## 2015-03-19 NOTE — Telephone Encounter (Signed)
Insurance denied [place]  Patient needs to try and fail Lactulose or polyethylene glycol

## 2015-03-19 NOTE — Progress Notes (Signed)
Lab only from yesterdays visit Gi panel

## 2015-03-19 NOTE — Telephone Encounter (Signed)
Please review

## 2015-03-20 ENCOUNTER — Telehealth: Payer: Self-pay | Admitting: Family

## 2015-03-20 NOTE — Telephone Encounter (Signed)
Spoke with patient who states that he is still having abdominal pain and this morning has diarrhea. Pt denies fever. Per note patient was constipated at last office visit and a new med was started. Advised patient that diarrhea could be from constipation as meds are beginning to work. Advised patient if abdominal pain worsens or he develops a fever that he should go to nearest ER. Patient verbalized understanding. Also advised patient that stool results were not back yet

## 2015-03-25 ENCOUNTER — Telehealth: Payer: Self-pay

## 2015-03-25 ENCOUNTER — Ambulatory Visit (INDEPENDENT_AMBULATORY_CARE_PROVIDER_SITE_OTHER): Payer: Medicare Other | Admitting: Family Medicine

## 2015-03-25 ENCOUNTER — Encounter: Payer: Self-pay | Admitting: Family Medicine

## 2015-03-25 VITALS — BP 132/79 | HR 60 | Temp 98.1°F | Ht 74.0 in | Wt 261.6 lb

## 2015-03-25 DIAGNOSIS — M25562 Pain in left knee: Secondary | ICD-10-CM | POA: Diagnosis not present

## 2015-03-25 DIAGNOSIS — M25561 Pain in right knee: Secondary | ICD-10-CM

## 2015-03-25 DIAGNOSIS — M129 Arthropathy, unspecified: Secondary | ICD-10-CM

## 2015-03-25 DIAGNOSIS — R1030 Lower abdominal pain, unspecified: Secondary | ICD-10-CM | POA: Diagnosis not present

## 2015-03-25 DIAGNOSIS — M1710 Unilateral primary osteoarthritis, unspecified knee: Secondary | ICD-10-CM

## 2015-03-25 LAB — GI PROFILE, STOOL, PCR
ASTROVIRUS: NOT DETECTED
Adenovirus F 40/41: NOT DETECTED
C DIFFICILE TOXIN A/B: NOT DETECTED
CAMPYLOBACTER: NOT DETECTED
Cryptosporidium: NOT DETECTED
Cyclospora cayetanensis: NOT DETECTED
ENTAMOEBA HISTOLYTICA: NOT DETECTED
ENTEROAGGREGATIVE E COLI: NOT DETECTED
ENTEROTOXIGENIC E COLI: NOT DETECTED
Enteropathogenic E coli: NOT DETECTED
Giardia lamblia: NOT DETECTED
NOROVIRUS GI/GII: NOT DETECTED
Plesiomonas shigelloides: NOT DETECTED
Rotavirus A: NOT DETECTED
SALMONELLA: NOT DETECTED
SHIGELLA/ENTEROINVASIVE E COLI: NOT DETECTED
Sapovirus: NOT DETECTED
Shiga-toxin-producing E coli: NOT DETECTED
VIBRIO CHOLERAE: NOT DETECTED
Vibrio: NOT DETECTED
YERSINIA ENTEROCOLITICA: NOT DETECTED

## 2015-03-25 LAB — OVA AND PARASITE EXAMINATION

## 2015-03-25 LAB — TEST CODE CHANGE

## 2015-03-25 MED ORDER — DULOXETINE HCL 30 MG PO CPEP: 30.0000 mg | ORAL_CAPSULE | Freq: Every day | ORAL | Status: DC

## 2015-03-25 NOTE — Patient Instructions (Signed)
Your can supplement the arthritis medication with glucosamine & chondroitin

## 2015-03-25 NOTE — Progress Notes (Signed)
Subjective:  Patient ID: LITTLE WINTON, male    DOB: Oct 17, 1945  Age: 70 y.o. MRN: [id number]  CC: Abdominal Pain   HPI Tyrus Wilms Sturgeon presents for Cramping cleared after 2 days. Constipation followed. Now back to normal. Pain essentially resolved & asymptomatic with exception of decreaseing nausea.  History Uriyah has a past medical history of Hyperlipidemia; MRSA (methicillin resistant staph aureus) culture positive; Hypertension; Hypothyroidism; Depression; Liver disease; Chronic hepatitis C ([place]); Sleep apnea; Obesity; GERD (gastroesophageal reflux disease); BPH (benign prostatic hypertrophy); C. difficile diarrhea; Arthritis; Blood transfusion without reported diagnosis; Substance abuse; Hepatitis C; Neurogenic bladder; and Bladder spasms.   He has past surgical history that includes Spine surgery; Medial partial knee replacement (Right); Lumbar epidural injection; Lumbar disc surgery; and Suprapubic catheter insertion.   His family history includes AAA (abdominal aortic aneurysm) in his father; Colon cancer in his mother; Ovarian cancer in his mother. There is no history of Esophageal cancer, Kidney disease, or Liver disease.He reports that he quit smoking about 21 years ago. His smoking use included Cigarettes. He has never used smokeless tobacco. He reports that he does not drink alcohol or use illicit drugs.  Outpatient Prescriptions Prior to Visit  Medication Sig Dispense Refill  . DIGESTIVE ENZYMES PO Take 2 tablets by mouth.    Marland Kitchen HARVONI 90-400 MG TABS Take 1 tablet by mouth every evening.  2  . levothyroxine (SYNTHROID, LEVOTHROID) 125 MCG tablet TAKE 1 TABLET BY MOUTH EVERY DAY NOTE DOSAGE CHANGE (Patient taking differently: TAKE 1 TABLET BY MOUTH EVERY DAY AT BEDTIME) 90 tablet 2  . Linaclotide (LINZESS) 290 MCG CAPS capsule Take 1 capsule (290 mcg total) by mouth daily. 30 capsule 2  . ondansetron (ZOFRAN) 8 MG tablet Take 1 tablet (8 mg total) by mouth every 8  (eight) hours as needed for nausea or vomiting. 50 tablet 5  . oxybutynin (DITROPAN) 5 MG tablet 1 tablet by mouth every 6 to 8 hours 15 tablet 0  . SUBOXONE 12-3 MG FILM Take 1 Film by mouth daily.   0  . tamsulosin (FLOMAX) 0.4 MG CAPS capsule Take 0.4 mg by mouth 2 (two) times daily.   0   Facility-Administered Medications Prior to Visit  Medication Dose Route Frequency Provider Last Rate Last Dose  . methylPREDNISolone acetate (DEPO-MEDROL) injection 80 mg  80 mg Intramuscular Once Lysbeth Penner, FNP        ROS Review of Systems  Constitutional: Negative for fever, chills, diaphoresis and unexpected weight change.  HENT: Negative for congestion, hearing loss, rhinorrhea and sore throat.   Eyes: Negative for visual disturbance.  Respiratory: Negative for cough and shortness of breath.   Cardiovascular: Negative for chest pain.  Gastrointestinal: Negative for abdominal pain, diarrhea and constipation.  Genitourinary: Negative for dysuria and flank pain.  Musculoskeletal: Positive for myalgias, joint swelling and arthralgias.  Skin: Negative for rash.  Neurological: Negative for dizziness and headaches.  Psychiatric/Behavioral: Negative for sleep disturbance and dysphoric mood.    Objective:  BP 132/79 mmHg  Pulse 60  Temp(Src) 98.1 F (36.7 C) (Oral)  Ht '6\' 2"'$  (1.88 m)  Wt 261 lb 9.6 oz (118.661 kg)  BMI 33.57 kg/m2  SpO2 98%  BP Readings from Last 3 Encounters:  03/25/15 132/79  03/18/15 150/92  03/08/15 124/87    Wt Readings from Last 3 Encounters:  03/25/15 261 lb 9.6 oz (118.661 kg)  03/18/15 254 lb (115.214 kg)  03/08/15 240 lb (108.863 kg)  Physical Exam  Constitutional: He appears well-developed and well-nourished.  HENT:  Head: Normocephalic and atraumatic.  Right Ear: Tympanic membrane and external ear normal. No decreased hearing is noted.  Left Ear: Tympanic membrane and external ear normal. No decreased hearing is noted.  Mouth/Throat: No  oropharyngeal exudate or posterior oropharyngeal erythema.  Eyes: Pupils are equal, round, and reactive to light.  Neck: Normal range of motion. Neck supple.  Cardiovascular: Normal rate and regular rhythm.   No murmur heard. Pulmonary/Chest: Breath sounds normal. No respiratory distress.  Abdominal: Soft. Bowel sounds are normal. He exhibits no mass. There is no tenderness.  Vitals reviewed.    Lab Results  Component Value Date   WBC 5.0 03/25/2015   HGB 11.6* 03/08/2015   HCT 33.6* 03/25/2015   PLT 191 03/25/2015   GLUCOSE 91 03/25/2015   CHOL 107 04/02/2014   TRIG 92 04/02/2014   HDL 47 04/02/2014   LDLCALC 42 04/02/2014   ALT 20 03/25/2015   AST 16 03/25/2015   NA 142 03/25/2015   K 4.4 03/25/2015   CL 104 03/25/2015   CREATININE 1.12 03/25/2015   BUN 12 03/25/2015   CO2 23 03/25/2015   TSH 2.690 10/17/2014   INR 1.73* 03/29/2014    Ct Abdomen Pelvis W Contrast  03/08/2015  CLINICAL DATA:  70 year old male with lower back pain x2 days, nausea, diarrhea. Recent history of UTI. EXAM: CT ABDOMEN AND PELVIS WITH CONTRAST TECHNIQUE: Multidetector CT imaging of the abdomen and pelvis was performed using the standard protocol following bolus administration of intravenous contrast. CONTRAST:  149m OMNIPAQUE IOHEXOL 300 MG/ML  SOLN COMPARISON:  CT dated 01/02/2015 FINDINGS: Minimal bibasilar dependent atelectatic changes. The visualized lung bases are otherwise clear. No intra-abdominal free air or free fluid identified. Stop the liver, gallbladder, pancreas, spleen, and adrenal glands appear unremarkable. There is a grossly stable appearing right renal cyst measuring approximately 4.5 cm. Subcentimeter bilateral renal hypodense lesions are too small to characterize. There is no hydronephrosis on either side. The visualized ureters and urinary bladder appear unremarkable. The prostate and seminal vesicles are grossly unremarkable. There is moderate stool throughout the colon. No  evidence of bowel obstruction or inflammation. Normal appendix. The abdominal aorta and IVC appear patent. No portal venous gas identified. There is no lymphadenopathy. There is multilevel degenerative changes of spine with multilevel thoracolumbar laminectomy. L2-S1 posterior fixation screws. No acute fracture. IMPRESSION: No acute intra-abdominal pelvic pathology. Extensive degenerative changes of the lumbar spine as well as stable appearing lumbar laminectomy and posterior fixation screws. Electronically Signed   By: AAnner CreteM.D.   On: 03/08/2015 18:39    Assessment & Plan:   WCrixuswas seen today for abdominal pain.  Diagnoses and all orders for this visit:  Lower abdominal pain -     Amylase -     Lipase -     CBC with Differential/Platelet -     CMP14+EGFR  Other orders -     DULoxetine (CYMBALTA) 30 MG capsule; Take 1 capsule (30 mg total) by mouth daily. For one week then two daily. Take with a full stomach at suppertime  I am having Mr. Widjaja start on DULoxetine. I am also having him maintain his tamsulosin, SUBOXONE, levothyroxine, HARVONI, DIGESTIVE ENZYMES PO, oxybutynin, ondansetron, Linaclotide, dicyclomine, and LORazepam. We will continue to administer methylPREDNISolone acetate.  Meds ordered this encounter  Medications  . dicyclomine (BENTYL) 20 MG tablet    Sig: take 1 tablet by mouth twice a day  if needed for SPASMS    Refill:  0  . LORazepam (ATIVAN) 1 MG tablet    Sig: Reported on 03/25/2015    Refill:  0  . DULoxetine (CYMBALTA) 30 MG capsule    Sig: Take 1 capsule (30 mg total) by mouth daily. For one week then two daily. Take with a full stomach at suppertime    Dispense:  60 capsule    Refill:  0     Follow-up: No Follow-up on file.  Claretta Fraise, M.D.

## 2015-03-25 NOTE — Telephone Encounter (Signed)
I appealed the decision to deny Linzess and it was overturned until 03/21/15 at which term patients insurance of [employer] termed out so he now must have another insurance

## 2015-03-25 NOTE — Telephone Encounter (Signed)
He was seen today and registered a new insurance. Can you check with that insurance please. Thanks, WS.

## 2015-03-26 LAB — CBC WITH DIFFERENTIAL/PLATELET
BASOS: 1 %
Basophils Absolute: 0 10*3/uL (ref 0.0–0.2)
EOS (ABSOLUTE): 0.2 10*3/uL (ref 0.0–0.4)
EOS: 5 %
HEMOGLOBIN: 11.6 g/dL — AB (ref 12.6–17.7)
Hematocrit: 33.6 % — ABNORMAL LOW (ref 37.5–51.0)
IMMATURE GRANS (ABS): 0 10*3/uL (ref 0.0–0.1)
Immature Granulocytes: 0 %
LYMPHS: 28 %
Lymphocytes Absolute: 1.4 10*3/uL (ref 0.7–3.1)
MCH: 27.9 pg (ref 26.6–33.0)
MCHC: 34.5 g/dL (ref 31.5–35.7)
MCV: 81 fL (ref 79–97)
MONOCYTES: 9 %
Monocytes Absolute: 0.5 10*3/uL (ref 0.1–0.9)
NEUTROS ABS: 2.9 10*3/uL (ref 1.4–7.0)
Neutrophils: 57 %
Platelets: 191 10*3/uL (ref 150–379)
RBC: 4.16 x10E6/uL (ref 4.14–5.80)
RDW: 13.5 % (ref 12.3–15.4)
WBC: 5 10*3/uL (ref 3.4–10.8)

## 2015-03-26 LAB — CMP14+EGFR
A/G RATIO: 1.8 (ref 1.1–2.5)
ALT: 20 IU/L (ref 0–44)
AST: 16 IU/L (ref 0–40)
Albumin: 4.2 g/dL (ref 3.6–4.8)
Alkaline Phosphatase: 85 IU/L (ref 39–117)
BILIRUBIN TOTAL: 0.3 mg/dL (ref 0.0–1.2)
BUN/Creatinine Ratio: 11 (ref 10–22)
BUN: 12 mg/dL (ref 8–27)
CALCIUM: 9.3 mg/dL (ref 8.6–10.2)
CHLORIDE: 104 mmol/L (ref 96–106)
CO2: 23 mmol/L (ref 18–29)
Creatinine, Ser: 1.12 mg/dL (ref 0.76–1.27)
GFR, EST AFRICAN AMERICAN: 78 mL/min/{1.73_m2} (ref >59)
GFR, EST NON AFRICAN AMERICAN: 68 mL/min/{1.73_m2} (ref >59)
GLOBULIN, TOTAL: 2.3 g/dL (ref 1.5–4.5)
Glucose: 91 mg/dL (ref 65–99)
POTASSIUM: 4.4 mmol/L (ref 3.5–5.2)
SODIUM: 142 mmol/L (ref 134–144)
TOTAL PROTEIN: 6.5 g/dL (ref 6.0–8.5)

## 2015-03-26 LAB — LIPASE: Lipase: 17 U/L (ref 0–59)

## 2015-03-26 LAB — AMYLASE: Amylase: 63 U/L (ref 31–124)

## 2015-03-30 ENCOUNTER — Telehealth: Payer: Self-pay

## 2015-03-30 MED ORDER — DIPHENOXYLATE-ATROPINE 2.5-0.025 MG PO TABS: 2.0000 | ORAL_TABLET | Freq: Four times a day (QID) | ORAL | Status: DC | PRN

## 2015-03-30 NOTE — Telephone Encounter (Signed)
Patient states that today he has already had diarrhea 5 times.

## 2015-03-30 NOTE — Telephone Encounter (Signed)
How many BMs a day?  Please call in lomotil 2 po QID prn diarrhea #40

## 2015-03-30 NOTE — Addendum Note (Signed)
Addended by: Thana Ates on: 03/30/2015 02:51 PM   Modules accepted: Orders

## 2015-03-30 NOTE — Telephone Encounter (Signed)
Patient was informed of labwork and states that he has had severe diarrhea and abdominal cramping for the last 3 days, has not been able to eat anything.  He has used the Immodium but has not seen any improvement.  He would like to know what you recommend.

## 2015-03-31 DIAGNOSIS — E039 Hypothyroidism, unspecified: Secondary | ICD-10-CM | POA: Diagnosis not present

## 2015-03-31 DIAGNOSIS — K2970 Gastritis, unspecified, without bleeding: Secondary | ICD-10-CM | POA: Diagnosis not present

## 2015-03-31 DIAGNOSIS — Z79899 Other long term (current) drug therapy: Secondary | ICD-10-CM | POA: Diagnosis not present

## 2015-03-31 DIAGNOSIS — R197 Diarrhea, unspecified: Secondary | ICD-10-CM | POA: Diagnosis not present

## 2015-03-31 DIAGNOSIS — R109 Unspecified abdominal pain: Secondary | ICD-10-CM | POA: Diagnosis not present

## 2015-03-31 DIAGNOSIS — R112 Nausea with vomiting, unspecified: Secondary | ICD-10-CM | POA: Diagnosis not present

## 2015-03-31 DIAGNOSIS — K529 Noninfective gastroenteritis and colitis, unspecified: Secondary | ICD-10-CM | POA: Diagnosis not present

## 2015-03-31 DIAGNOSIS — I10 Essential (primary) hypertension: Secondary | ICD-10-CM | POA: Diagnosis not present

## 2015-03-31 DIAGNOSIS — R1030 Lower abdominal pain, unspecified: Secondary | ICD-10-CM | POA: Diagnosis not present

## 2015-04-06 DIAGNOSIS — B182 Chronic viral hepatitis C: Secondary | ICD-10-CM | POA: Diagnosis not present

## 2015-04-07 ENCOUNTER — Telehealth (HOSPITAL_COMMUNITY): Payer: Self-pay | Admitting: Physical Therapy

## 2015-04-07 NOTE — Telephone Encounter (Signed)
He has the stomach flu and will call us back to reschedule. NF 04/07/15

## 2015-04-08 ENCOUNTER — Ambulatory Visit (HOSPITAL_COMMUNITY): Payer: Medicare Other | Admitting: Physical Therapy

## 2015-04-13 DIAGNOSIS — K7469 Other cirrhosis of liver: Secondary | ICD-10-CM | POA: Diagnosis not present

## 2015-04-13 DIAGNOSIS — B182 Chronic viral hepatitis C: Secondary | ICD-10-CM | POA: Diagnosis not present

## 2015-04-14 ENCOUNTER — Other Ambulatory Visit: Payer: Self-pay | Admitting: Nurse Practitioner

## 2015-04-14 DIAGNOSIS — K7460 Unspecified cirrhosis of liver: Secondary | ICD-10-CM

## 2015-04-15 DIAGNOSIS — Z8614 Personal history of Methicillin resistant Staphylococcus aureus infection: Secondary | ICD-10-CM | POA: Diagnosis not present

## 2015-04-15 DIAGNOSIS — M5135 Other intervertebral disc degeneration, thoracolumbar region: Secondary | ICD-10-CM | POA: Diagnosis not present

## 2015-04-15 DIAGNOSIS — M5136 Other intervertebral disc degeneration, lumbar region: Secondary | ICD-10-CM | POA: Diagnosis not present

## 2015-04-15 DIAGNOSIS — Z8619 Personal history of other infectious and parasitic diseases: Secondary | ICD-10-CM | POA: Diagnosis not present

## 2015-04-15 DIAGNOSIS — R109 Unspecified abdominal pain: Secondary | ICD-10-CM | POA: Diagnosis not present

## 2015-04-15 DIAGNOSIS — A049 Bacterial intestinal infection, unspecified: Secondary | ICD-10-CM | POA: Diagnosis not present

## 2015-04-15 DIAGNOSIS — G8929 Other chronic pain: Secondary | ICD-10-CM | POA: Diagnosis not present

## 2015-04-15 DIAGNOSIS — Z87891 Personal history of nicotine dependence: Secondary | ICD-10-CM | POA: Diagnosis not present

## 2015-04-15 DIAGNOSIS — Z79899 Other long term (current) drug therapy: Secondary | ICD-10-CM | POA: Diagnosis not present

## 2015-04-15 DIAGNOSIS — Z981 Arthrodesis status: Secondary | ICD-10-CM | POA: Diagnosis not present

## 2015-04-15 DIAGNOSIS — E039 Hypothyroidism, unspecified: Secondary | ICD-10-CM | POA: Diagnosis not present

## 2015-04-15 DIAGNOSIS — I159 Secondary hypertension, unspecified: Secondary | ICD-10-CM | POA: Diagnosis not present

## 2015-04-15 DIAGNOSIS — I10 Essential (primary) hypertension: Secondary | ICD-10-CM | POA: Diagnosis not present

## 2015-04-15 DIAGNOSIS — R1084 Generalized abdominal pain: Secondary | ICD-10-CM | POA: Diagnosis not present

## 2015-04-15 DIAGNOSIS — A084 Viral intestinal infection, unspecified: Secondary | ICD-10-CM | POA: Diagnosis not present

## 2015-04-20 ENCOUNTER — Ambulatory Visit: Payer: Medicare Other | Admitting: Family

## 2015-04-21 ENCOUNTER — Telehealth: Payer: Self-pay | Admitting: Internal Medicine

## 2015-04-21 ENCOUNTER — Telehealth: Payer: Self-pay | Admitting: Family Medicine

## 2015-04-21 ENCOUNTER — Telehealth: Payer: Self-pay

## 2015-04-21 MED ORDER — DIPHENOXYLATE-ATROPINE 2.5-0.025 MG PO TABS: 2.0000 | ORAL_TABLET | Freq: Four times a day (QID) | ORAL | Status: DC | PRN

## 2015-04-21 MED ORDER — DICYCLOMINE HCL 20 MG PO TABS: ORAL_TABLET | ORAL | Status: DC

## 2015-04-21 NOTE — Telephone Encounter (Signed)
1 refill of Bentyl okay

## 2015-04-21 NOTE — Telephone Encounter (Signed)
Pt states he is having diarrhea again and abdominal cramping. States he saw his PCP and went to the ER, was tested for Cdiff and that was negative. States he was told he has a virus. Pt scheduled to see Amy Esterwood PA 04/27/15@2pm . Pt is requesting refill on Bentyl until OV. Please advise regarding bentyl refill.

## 2015-04-21 NOTE — Telephone Encounter (Signed)
Refill sent to pharmacy.   

## 2015-04-21 NOTE — Telephone Encounter (Signed)
Insurance approved prior authorization for Ondansetron

## 2015-04-21 NOTE — Telephone Encounter (Signed)
Refill on lomotil

## 2015-04-21 NOTE — Telephone Encounter (Signed)
Pt aware of rx sent to the pharmacy.

## 2015-04-23 DIAGNOSIS — R1084 Generalized abdominal pain: Secondary | ICD-10-CM | POA: Diagnosis not present

## 2015-04-23 DIAGNOSIS — I10 Essential (primary) hypertension: Secondary | ICD-10-CM | POA: Diagnosis not present

## 2015-04-23 DIAGNOSIS — R197 Diarrhea, unspecified: Secondary | ICD-10-CM | POA: Diagnosis not present

## 2015-04-23 DIAGNOSIS — Z79899 Other long term (current) drug therapy: Secondary | ICD-10-CM | POA: Diagnosis not present

## 2015-04-23 DIAGNOSIS — Z8614 Personal history of Methicillin resistant Staphylococcus aureus infection: Secondary | ICD-10-CM | POA: Diagnosis not present

## 2015-04-23 DIAGNOSIS — Z8619 Personal history of other infectious and parasitic diseases: Secondary | ICD-10-CM | POA: Diagnosis not present

## 2015-04-23 DIAGNOSIS — E039 Hypothyroidism, unspecified: Secondary | ICD-10-CM | POA: Diagnosis not present

## 2015-04-24 ENCOUNTER — Other Ambulatory Visit: Payer: Self-pay

## 2015-04-24 ENCOUNTER — Ambulatory Visit
Admission: RE | Admit: 2015-04-24 | Discharge: 2015-04-24 | Disposition: A | Discharge status: Home / Self Care | Payer: Medicare Other | Source: Ambulatory Visit | Attending: Nurse Practitioner | Admitting: Nurse Practitioner

## 2015-04-24 DIAGNOSIS — K7460 Unspecified cirrhosis of liver: Secondary | ICD-10-CM | POA: Diagnosis not present

## 2015-04-24 DIAGNOSIS — M25562 Pain in left knee: Secondary | ICD-10-CM | POA: Diagnosis not present

## 2015-04-24 DIAGNOSIS — M25561 Pain in right knee: Secondary | ICD-10-CM | POA: Diagnosis not present

## 2015-04-27 ENCOUNTER — Ambulatory Visit: Payer: Self-pay | Admitting: Physician Assistant

## 2015-04-28 DIAGNOSIS — Z79899 Other long term (current) drug therapy: Secondary | ICD-10-CM | POA: Diagnosis not present

## 2015-04-28 DIAGNOSIS — E039 Hypothyroidism, unspecified: Secondary | ICD-10-CM | POA: Diagnosis not present

## 2015-04-28 DIAGNOSIS — R197 Diarrhea, unspecified: Secondary | ICD-10-CM | POA: Diagnosis not present

## 2015-04-28 DIAGNOSIS — Z8614 Personal history of Methicillin resistant Staphylococcus aureus infection: Secondary | ICD-10-CM | POA: Diagnosis not present

## 2015-04-28 DIAGNOSIS — R1084 Generalized abdominal pain: Secondary | ICD-10-CM | POA: Diagnosis not present

## 2015-04-28 DIAGNOSIS — G8929 Other chronic pain: Secondary | ICD-10-CM | POA: Diagnosis not present

## 2015-04-28 DIAGNOSIS — R112 Nausea with vomiting, unspecified: Secondary | ICD-10-CM | POA: Diagnosis not present

## 2015-04-29 ENCOUNTER — Ambulatory Visit (INDEPENDENT_AMBULATORY_CARE_PROVIDER_SITE_OTHER): Payer: Medicare Other | Admitting: Family Medicine

## 2015-04-29 ENCOUNTER — Encounter: Payer: Self-pay | Admitting: Family Medicine

## 2015-04-29 VITALS — BP 126/76 | HR 76 | Temp 98.0°F | Ht 74.0 in | Wt 257.0 lb

## 2015-04-29 DIAGNOSIS — R1030 Lower abdominal pain, unspecified: Secondary | ICD-10-CM

## 2015-04-29 MED ORDER — LINACLOTIDE 290 MCG PO CAPS: 290.0000 ug | ORAL_CAPSULE | Freq: Every day | ORAL | Status: DC

## 2015-04-29 MED ORDER — DICYCLOMINE HCL 20 MG PO TABS: ORAL_TABLET | ORAL | Status: DC

## 2015-04-29 NOTE — Progress Notes (Signed)
Subjective:    Patient ID: Kenneth Rhodes, male    DOB: 07/11/1945, 70 y.o.   MRN: [id number]  HPI 70 year old gentleman with a long history of GI problems. Initially had MRSA infection after a epidural injection. Subsequent to treating MRSA he come tract and C. difficile which has been a recurring problem but he also had some paralysis of muscles in the perineum that affected his bladder and bowel control. He has had multiple trips to emergency rooms and doctor's offices with these symptoms. He has an appointment in about 2 weeks with another GI doctor in [place]. He gives himself an enema to cause bowel movements but also has some diarrhea. He takes medicine for nausea and vomiting both Phenergan and Zofran and takes ranitidine for acid reflux. He was seen at [facility] emergency room last night. Today his chief complaint is stomach cramps.  Patient Active Problem List   Diagnosis Date Noted  . GAD (generalized anxiety disorder) 01/19/2015  . Lower abdominal pain 10/22/2014  . Diarrhea 10/22/2014  . Hepatitis C 10/17/2014  . Testosterone deficiency 06/09/2014  . Vitamin D deficiency 06/09/2014  . Opiate addiction ([place]) 03/26/2014  . Opiate withdrawal ([place]) 03/26/2014  . Depression, major, recurrent ([place]) 03/26/2014  . Suicidal ideation 03/26/2014  . Chronic pain 03/26/2014  . Lumbar radiculopathy, chronic 03/26/2014  . Hypothyroidism 03/26/2014  . BPH (benign prostatic hyperplasia) 03/26/2014  . Bladder spasm 03/26/2014  . Opioid dependence with opioid-induced mood disorder [facility])    Outpatient Encounter Prescriptions as of 04/29/2015  Medication Sig  . dicyclomine (BENTYL) 20 MG tablet take 1 tablet by mouth twice a day if needed for SPASMS  . DIGESTIVE ENZYMES PO Take 2 tablets by mouth.  . levothyroxine (SYNTHROID, LEVOTHROID) 125 MCG tablet TAKE 1 TABLET BY MOUTH EVERY DAY NOTE DOSAGE CHANGE (Patient taking differently: TAKE 1 TABLET BY MOUTH EVERY DAY AT BEDTIME)  .  ondansetron (ZOFRAN) 8 MG tablet Take 1 tablet (8 mg total) by mouth every 8 (eight) hours as needed for nausea or vomiting.  Marland Kitchen PROMETHEGAN 50 MG suppository Place 1 suppository rectally as needed.  . SUBOXONE 12-3 MG FILM Take 1 Film by mouth daily.   . tamsulosin (FLOMAX) 0.4 MG CAPS capsule Take 0.4 mg by mouth 2 (two) times daily.   . [DISCONTINUED] DULoxetine (CYMBALTA) 30 MG capsule Take 1 capsule (30 mg total) by mouth daily. For one week then two daily. Take with a full stomach at suppertime  . diphenoxylate-atropine (LOMOTIL) 2.5-0.025 MG tablet Take 2 tablets by mouth 4 (four) times daily as needed for diarrhea or loose stools. (Patient not taking: Reported on 04/29/2015)  . LORazepam (ATIVAN) 1 MG tablet Reported on 04/29/2015  . oxybutynin (DITROPAN) 5 MG tablet 1 tablet by mouth every 6 to 8 hours (Patient not taking: Reported on 04/29/2015)  . [DISCONTINUED] HARVONI 90-400 MG TABS Take 1 tablet by mouth every evening.  . [DISCONTINUED] Linaclotide (LINZESS) 290 MCG CAPS capsule Take 1 capsule (290 mcg total) by mouth daily.   Facility-Administered Encounter Medications as of 04/29/2015  Medication  . methylPREDNISolone acetate (DEPO-MEDROL) injection 80 mg      Review of Systems  Constitutional: Negative.   Gastrointestinal: Positive for nausea, abdominal pain, diarrhea and constipation.       Objective:   Physical Exam  Constitutional: He appears well-developed and well-nourished.  Abdominal: Soft. He exhibits no mass. There is tenderness. There is no rebound and no guarding.  Assessment & Plan:  1. Lower abdominal pain As Outlined in history of present illness history is complicated. Based on the history he did get some relief from whence S but his insurance did not cover it. I gave him some samples today, refill Bentyl. Encouraged him to keep appointment with gastroenterologist in [facility] [place]

## 2015-04-30 ENCOUNTER — Other Ambulatory Visit: Payer: Medicare Other

## 2015-04-30 DIAGNOSIS — R197 Diarrhea, unspecified: Secondary | ICD-10-CM | POA: Diagnosis not present

## 2015-05-01 ENCOUNTER — Other Ambulatory Visit: Payer: Self-pay

## 2015-05-01 LAB — CLOSTRIDIUM DIFFICILE BY PCR: CDIFFPCR: DETECTED — AB

## 2015-05-01 MED ORDER — VANCOMYCIN 50 MG/ML ORAL SOLUTION: 125.0000 mg | Freq: Four times a day (QID) | ORAL | Status: DC

## 2015-05-04 ENCOUNTER — Telehealth: Payer: Self-pay | Admitting: Internal Medicine

## 2015-05-04 DIAGNOSIS — R109 Unspecified abdominal pain: Secondary | ICD-10-CM | POA: Diagnosis not present

## 2015-05-04 DIAGNOSIS — A047 Enterocolitis due to Clostridium difficile: Secondary | ICD-10-CM | POA: Diagnosis not present

## 2015-05-04 DIAGNOSIS — G894 Chronic pain syndrome: Secondary | ICD-10-CM | POA: Diagnosis not present

## 2015-05-04 DIAGNOSIS — I10 Essential (primary) hypertension: Secondary | ICD-10-CM | POA: Diagnosis not present

## 2015-05-04 DIAGNOSIS — E039 Hypothyroidism, unspecified: Secondary | ICD-10-CM | POA: Diagnosis not present

## 2015-05-04 DIAGNOSIS — K560 Paralytic ileus: Secondary | ICD-10-CM | POA: Diagnosis not present

## 2015-05-04 DIAGNOSIS — R1031 Right lower quadrant pain: Secondary | ICD-10-CM | POA: Diagnosis not present

## 2015-05-04 DIAGNOSIS — Z8614 Personal history of Methicillin resistant Staphylococcus aureus infection: Secondary | ICD-10-CM | POA: Diagnosis not present

## 2015-05-04 MED ORDER — HYOSCYAMINE SULFATE 0.125 MG SL SUBL: 0.1250 mg | SUBLINGUAL_TABLET | SUBLINGUAL | Status: DC | PRN

## 2015-05-04 MED ORDER — VANCOMYCIN 50 MG/ML ORAL SOLUTION: 250.0000 mg | Freq: Four times a day (QID) | ORAL | Status: DC

## 2015-05-04 NOTE — Telephone Encounter (Signed)
Vaughan Basta, please evaluate this compliant and get back to me

## 2015-05-04 NOTE — Telephone Encounter (Signed)
Pt states he started the vancomycin 125mg  every 6 hours on Friday. Pt states he took it all weekend but he is still having bad abdominal cramping and had 4 diarrhea stools this morning. Asked pt is he has taken his bentyl and he states he has but it didn't help. Pt wants to know if he can take something to help slow the diarrhea down and something for the cramping/pain. Please advise.

## 2015-05-04 NOTE — Telephone Encounter (Signed)
It may be too early to expect a response. However, increases vancomycin to 250 mg 4 times a day. For cramping he can try sublingual Levsin. Please prescribe

## 2015-05-04 NOTE — Telephone Encounter (Signed)
Pt aware of increase in vancomycin, new script sent to pharmacy and levsin script sent to pharmacy.

## 2015-05-05 DIAGNOSIS — Z79899 Other long term (current) drug therapy: Secondary | ICD-10-CM | POA: Diagnosis not present

## 2015-05-05 DIAGNOSIS — B1920 Unspecified viral hepatitis C without hepatic coma: Secondary | ICD-10-CM | POA: Diagnosis present

## 2015-05-05 DIAGNOSIS — Z885 Allergy status to narcotic agent status: Secondary | ICD-10-CM | POA: Diagnosis not present

## 2015-05-05 DIAGNOSIS — I10 Essential (primary) hypertension: Secondary | ICD-10-CM | POA: Diagnosis present

## 2015-05-05 DIAGNOSIS — A047 Enterocolitis due to Clostridium difficile: Secondary | ICD-10-CM | POA: Diagnosis not present

## 2015-05-05 DIAGNOSIS — Z79891 Long term (current) use of opiate analgesic: Secondary | ICD-10-CM | POA: Diagnosis not present

## 2015-05-05 DIAGNOSIS — E039 Hypothyroidism, unspecified: Secondary | ICD-10-CM | POA: Diagnosis present

## 2015-05-05 DIAGNOSIS — Z96651 Presence of right artificial knee joint: Secondary | ICD-10-CM | POA: Diagnosis present

## 2015-05-05 DIAGNOSIS — N400 Enlarged prostate without lower urinary tract symptoms: Secondary | ICD-10-CM | POA: Diagnosis present

## 2015-05-05 DIAGNOSIS — G8389 Other specified paralytic syndromes: Secondary | ICD-10-CM | POA: Diagnosis present

## 2015-05-05 DIAGNOSIS — G4733 Obstructive sleep apnea (adult) (pediatric): Secondary | ICD-10-CM | POA: Diagnosis present

## 2015-05-05 DIAGNOSIS — Z9889 Other specified postprocedural states: Secondary | ICD-10-CM | POA: Diagnosis not present

## 2015-05-05 DIAGNOSIS — M810 Age-related osteoporosis without current pathological fracture: Secondary | ICD-10-CM | POA: Diagnosis present

## 2015-05-05 DIAGNOSIS — G894 Chronic pain syndrome: Secondary | ICD-10-CM | POA: Diagnosis not present

## 2015-05-05 DIAGNOSIS — Z8614 Personal history of Methicillin resistant Staphylococcus aureus infection: Secondary | ICD-10-CM | POA: Diagnosis not present

## 2015-05-05 DIAGNOSIS — K560 Paralytic ileus: Secondary | ICD-10-CM | POA: Diagnosis not present

## 2015-05-14 ENCOUNTER — Ambulatory Visit (INDEPENDENT_AMBULATORY_CARE_PROVIDER_SITE_OTHER): Payer: Medicare Other | Admitting: Internal Medicine

## 2015-05-14 ENCOUNTER — Encounter: Payer: Self-pay | Admitting: Internal Medicine

## 2015-05-14 VITALS — BP 158/100 | HR 98 | Ht 74.0 in | Wt 253.4 lb

## 2015-05-14 DIAGNOSIS — A047 Enterocolitis due to Clostridium difficile: Secondary | ICD-10-CM

## 2015-05-14 DIAGNOSIS — B182 Chronic viral hepatitis C: Secondary | ICD-10-CM | POA: Diagnosis not present

## 2015-05-14 DIAGNOSIS — Z800 Family history of malignant neoplasm of digestive organs: Secondary | ICD-10-CM

## 2015-05-14 DIAGNOSIS — R110 Nausea: Secondary | ICD-10-CM

## 2015-05-14 DIAGNOSIS — R1030 Lower abdominal pain, unspecified: Secondary | ICD-10-CM

## 2015-05-14 DIAGNOSIS — A0472 Enterocolitis due to Clostridium difficile, not specified as recurrent: Secondary | ICD-10-CM

## 2015-05-14 MED ORDER — PROMETHAZINE HCL 25 MG PO TABS: 25.0000 mg | ORAL_TABLET | Freq: Four times a day (QID) | ORAL | Status: DC | PRN

## 2015-05-14 MED ORDER — VANCOMYCIN 50 MG/ML ORAL SOLUTION
250.0000 mg | Freq: Four times a day (QID) | ORAL | Status: DC
Start: 2015-05-14 — End: 2015-06-05

## 2015-05-14 NOTE — Progress Notes (Signed)
HISTORY OF PRESENT ILLNESS:  Kenneth Rhodes is a 70 y.o. male with multiple medical problems including chronic back pain with prior back surgery and a history of MRSA spinal infection, chronic pain syndrome with opioid addiction, neurogenic bladder requiring suprapubic catheter placement until recently, ongoing urologic issues with self-catheterization and intermittent UTIs requiring antibiotics, hepatitis C for which he was referred to [facility] outpatient hepatology clinic was subsequent successful treatment with Harvoni, erosive esophagitis on EGD elsewhere, various difficulties with bowel habits including constipation and diarrhea, and recurrent Clostridium difficile infection. Patient has contacted this office on multiple occasions with complaints of abdominal pain without found basis (at least GI) requesting pain medication, varying degrees of abnormal bowel habits, and nausea. Found by his PCP earlier this month to be Clostridium difficile positive. That laboratory was forwarded to this office without notification. Fortunately, we responded and place the patient on vancomycin 125 mg 4 times a day. Several days later he contacted the office stating that he was having abdominal pain and ongoing diarrhea. Vancomycin was increased to 250 mg 4 times a day. The patient was seen at one point by GI office elsewhere. Had an appointment with further GI office elsewhere but canceled that. He presented himself to [facility] 05/05/2015 with chief complaint of abdominal pain and diarrhea. Workup was negative including normal white blood cell count. X-rays revealed mild small bowel dilation with air-fluid levels. He was treated with metronidazole (was told they did not have vancomycin). Not sure if he received pain medication but was discharged home on the 16th. I have reviewed the discharge summary. The patient resumed vancomycin after discharge. He is a very confusing historian  historically and again today. He continues to complain of vague abdominal pain. He reports irregular bowel habits. States he has had 2 formed bowel movements in the past few weeks as well as no bowel movement on one day. Some diarrhea. Clearly less diarrhea since initiating vancomycin. The patient is concerned about colon cancer. States that his mother had colon cancer. Tells me he has not had a colonoscopy in 10 years. We did check his previous GI office which reported the EGD but no colonoscopy. He has not been having bleeding. He has had multiple CT scans of the abdomen and pelvis with contrast as recently as 03/08/2015. No intra-abdominal pathology identified. Moderate stool throughout the colon. He had been prescribed linzess elsewhere for constipation. Not taking currently. He states antispasmodics do not help his pain. He complains of ongoing nausea for which Zofran does not work. He states that he is out of Phenergan suppositories but does report oral Phenergan is helpful. He reports his nausea significant, but no vomiting.  REVIEW OF SYSTEMS:  All non-GI ROS negative except for anxiety, arthritis, back pain, depression, fatigue, muscle cramps, muscle pain, headaches, sleeping problems, ankle edema, urinary frequency, painful urination, shortness of breath  Past Medical History  Diagnosis Date  . Hyperlipidemia   . MRSA (methicillin resistant staph aureus) culture positive   . Hypertension   . Hypothyroidism   . Depression   . Liver disease   . Chronic hepatitis C ([place])   . Sleep apnea   . Obesity   . GERD (gastroesophageal reflux disease)   . BPH (benign prostatic hypertrophy)   . C. difficile diarrhea   . Arthritis   . Blood transfusion without reported diagnosis   . Substance abuse     pain medication  . Hepatitis C   . Neurogenic bladder   .  Bladder spasms     Past Surgical History  Procedure Laterality Date  . Spine surgery      due to mrsa  . Medial partial knee  replacement Right   . Lumbar epidural injection    . Lumbar disc surgery    . Suprapubic catheter insertion      Social History Leigh Kaeding Wolke  reports that he quit smoking about 21 years ago. His smoking use included Cigarettes. He has never used smokeless tobacco. He reports that he does not drink alcohol or use illicit drugs.  family history includes AAA (abdominal aortic aneurysm) in his father; Colon cancer in his mother; Ovarian cancer in his mother. There is no history of Esophageal cancer, Kidney disease, or Liver disease.  Allergies  Allergen Reactions  . Hydrocodone Nausea Only       PHYSICAL EXAMINATION: Vital signs: BP 158/100 mmHg  Pulse 98  Ht 6' 2" (1.88 m)  Wt 253 lb 6.4 oz (114.941 kg)  BMI 32.52 kg/m2  Constitutional: generally well-appearing, no acute distress Psychiatric: alert and oriented x3, cooperative Eyes: extraocular movements intact, anicteric, conjunctiva pink Mouth: oral pharynx moist, no lesions Neck: supple no lymphadenopathy Cardiovascular: heart regular rate and rhythm, no murmur Lungs: clear to auscultation bilaterally Abdomen: soft, nontender, nondistended, no obvious ascites, no peritoneal signs, normal bowel sounds, no organomegaly Rectal: Deferred Extremities: no clubbing cyanosis or lower extremity edema bilaterally Skin: no lesions on visible extremities Neuro: No focal deficits. Cranial nerves intact  ASSESSMENT:  #1. Clostridium difficile related diarrhea secondary to recurrent antibiotic exposures for urologic infections. Overall I think he is responding to vancomycin. Difficult to tell has healed as has abdominal and bowel complaints with or without Clostridium difficile as has been documented #2. Nausea. Ongoing #3. Chronic abdominal pain. Functional or urologic #4. History of opioid addiction #5. Hepatitis C. Status post treatment with Harvvoni #6. History of GERD with erosive esophagitis #7. Chronic alternating bowel  habits #8. Family history of colon cancer. Apparently at last colonoscopy 10+ years ago   PLAN:  #1. Continue vancomycin for another 3-1/2 weeks #2. Recheck Clostridium difficile stool PCR at that time #3. Schedule screening colonoscopy if Clostridium difficile negative.The nature of the procedure, as well as the risks, benefits, and alternatives were carefully and thoroughly reviewed with the patient. Ample time for discussion and questions allowed. The patient understood, was satisfied, and agreed to proceed. #4. Prescribe Phenergan 25 mg by mouth every 6 hours when necessary. #60. No refills #5. Denied pain medication #6. Ongoing general medical care with PCP  45 minutes spent face-to-face with the patient. Greater than 50% of the time use for counseling regarding his myriad of GI issues and compliance as well as the rationale behind the treatment plan recommendations

## 2015-05-14 NOTE — Patient Instructions (Addendum)
We have sent the following medications to your pharmacy for you to pick up at your convenience:  Vancomycin, Phenergan  Please take the Vancomycin for another month and then come to the lab to have another C diff test.

## 2015-05-18 ENCOUNTER — Telehealth: Payer: Self-pay | Admitting: Family Medicine

## 2015-05-18 MED ORDER — LEVOTHYROXINE SODIUM 125 MCG PO TABS: 125.0000 ug | ORAL_TABLET | Freq: Every day | ORAL | Status: DC

## 2015-05-18 NOTE — Telephone Encounter (Signed)
done

## 2015-05-25 ENCOUNTER — Telehealth: Payer: Self-pay | Admitting: Internal Medicine

## 2015-05-25 MED ORDER — DICYCLOMINE HCL 20 MG PO TABS: ORAL_TABLET | ORAL | Status: DC

## 2015-05-25 NOTE — Telephone Encounter (Signed)
Pt states he doesn't think the vancomycin is working. States he is having 5-6 stools/day but his main concern is the abdominal pain. States he is in pain everyday and states it is "excrutiating." Pt states that bentyl and levsin are not helping at all. Please advise.

## 2015-05-25 NOTE — Telephone Encounter (Signed)
Pt aware and states he has kit already for the lab. Pt states he hurts so bad he thinks he should be in the hospital. Let pt know he can always go to the ER if the pain is that severe. Pt asked for refill on bentyl. Refill sent to pharmacy.

## 2015-05-25 NOTE — Telephone Encounter (Signed)
Nothing new to offer for his chronic abdominal pain. Recheck stool for Clostridium difficile by PCR

## 2015-05-29 ENCOUNTER — Other Ambulatory Visit: Payer: Medicare Other

## 2015-05-29 DIAGNOSIS — R110 Nausea: Secondary | ICD-10-CM

## 2015-05-29 DIAGNOSIS — A047 Enterocolitis due to Clostridium difficile: Secondary | ICD-10-CM | POA: Diagnosis not present

## 2015-05-29 DIAGNOSIS — A0472 Enterocolitis due to Clostridium difficile, not specified as recurrent: Secondary | ICD-10-CM

## 2015-05-30 LAB — CLOSTRIDIUM DIFFICILE BY PCR: Toxigenic C. Difficile by PCR: NOT DETECTED

## 2015-06-05 ENCOUNTER — Ambulatory Visit (AMBULATORY_SURGERY_CENTER): Payer: Self-pay

## 2015-06-05 VITALS — Ht 73.0 in | Wt 262.0 lb

## 2015-06-05 DIAGNOSIS — Z1211 Encounter for screening for malignant neoplasm of colon: Secondary | ICD-10-CM

## 2015-06-05 MED ORDER — NA SULFATE-K SULFATE-MG SULF 17.5-3.13-1.6 GM/177ML PO SOLN: 1.0000 | Freq: Once | ORAL | Status: DC

## 2015-06-05 NOTE — Progress Notes (Signed)
No egg or soy allergies Not on home 02 No previous anesthesia complications No diet or weight loss meds 

## 2015-06-08 ENCOUNTER — Telehealth: Payer: Self-pay | Admitting: Family Medicine

## 2015-06-08 NOTE — Telephone Encounter (Signed)
Advised we don't have admitting privileges at any hospital so the best thing to do is to go to the ED. Pt voiced understanding.

## 2015-06-09 ENCOUNTER — Other Ambulatory Visit: Payer: Medicare Other

## 2015-06-09 ENCOUNTER — Other Ambulatory Visit: Payer: Self-pay

## 2015-06-09 ENCOUNTER — Emergency Department (HOSPITAL_COMMUNITY)
Admission: EM | Admit: 2015-06-09 | Discharge: 2015-06-09 | Disposition: A | Discharge status: Home / Self Care | Payer: Medicare Other

## 2015-06-09 ENCOUNTER — Telehealth: Payer: Self-pay | Admitting: Internal Medicine

## 2015-06-09 DIAGNOSIS — R197 Diarrhea, unspecified: Secondary | ICD-10-CM

## 2015-06-09 NOTE — ED Notes (Addendum)
Writer called for VS for triage, no response.

## 2015-06-09 NOTE — ED Notes (Signed)
Pt called for triaging, with no response.

## 2015-06-09 NOTE — ED Notes (Signed)
Pt called for triage and VS with no response.  RN notified

## 2015-06-09 NOTE — ED Notes (Signed)
Called x 1 w/o response

## 2015-06-09 NOTE — Telephone Encounter (Signed)
Pt states he has diarrhea again and abdominal cramping. Thinks may have cdiff again. Pt to come and give a stool specimen.

## 2015-06-10 DIAGNOSIS — G8929 Other chronic pain: Secondary | ICD-10-CM | POA: Diagnosis not present

## 2015-06-10 DIAGNOSIS — M549 Dorsalgia, unspecified: Secondary | ICD-10-CM | POA: Diagnosis not present

## 2015-06-10 DIAGNOSIS — R109 Unspecified abdominal pain: Secondary | ICD-10-CM | POA: Diagnosis not present

## 2015-06-10 DIAGNOSIS — R110 Nausea: Secondary | ICD-10-CM | POA: Diagnosis not present

## 2015-06-10 LAB — CLOSTRIDIUM DIFFICILE BY PCR: Toxigenic C. Difficile by PCR: NOT DETECTED

## 2015-06-10 NOTE — Telephone Encounter (Signed)
Pt went to the ER last night.

## 2015-06-18 ENCOUNTER — Encounter: Payer: Self-pay | Admitting: Internal Medicine

## 2015-06-18 ENCOUNTER — Ambulatory Visit (AMBULATORY_SURGERY_CENTER): Payer: Medicare Other | Admitting: Internal Medicine

## 2015-06-18 VITALS — BP 122/71 | HR 58 | Temp 97.1°F | Resp 16 | Ht 73.0 in | Wt 262.0 lb

## 2015-06-18 DIAGNOSIS — K529 Noninfective gastroenteritis and colitis, unspecified: Secondary | ICD-10-CM | POA: Diagnosis not present

## 2015-06-18 DIAGNOSIS — Z1211 Encounter for screening for malignant neoplasm of colon: Secondary | ICD-10-CM | POA: Diagnosis not present

## 2015-06-18 DIAGNOSIS — R110 Nausea: Secondary | ICD-10-CM

## 2015-06-18 DIAGNOSIS — I10 Essential (primary) hypertension: Secondary | ICD-10-CM | POA: Diagnosis not present

## 2015-06-18 DIAGNOSIS — G4733 Obstructive sleep apnea (adult) (pediatric): Secondary | ICD-10-CM | POA: Diagnosis not present

## 2015-06-18 DIAGNOSIS — K635 Polyp of colon: Secondary | ICD-10-CM | POA: Diagnosis not present

## 2015-06-18 DIAGNOSIS — B1920 Unspecified viral hepatitis C without hepatic coma: Secondary | ICD-10-CM | POA: Diagnosis not present

## 2015-06-18 MED ORDER — SODIUM CHLORIDE 0.9 % IV SOLN: 500.0000 mL | INTRAVENOUS | Status: DC

## 2015-06-18 MED ORDER — ONDANSETRON HCL 4 MG PO TABS
4.0000 mg | ORAL_TABLET | Freq: Once | ORAL | Status: AC
  Administered 2015-06-18: 4 mg via ORAL

## 2015-06-18 NOTE — Progress Notes (Signed)
Report given to RN,  vss

## 2015-06-18 NOTE — Patient Instructions (Signed)
YOU HAD AN ENDOSCOPIC PROCEDURE TODAY AT [facility]:   Refer to the procedure report that was given to you for any specific questions about what was found during the examination.  If the procedure report does not answer your questions, please call your gastroenterologist to clarify.  If you requested that your care partner not be given the details of your procedure findings, then the procedure report has been included in a sealed envelope for you to review at your convenience later.  YOU SHOULD EXPECT: Some feelings of bloating in the abdomen. Passage of more gas than usual.  Walking can help get rid of the air that was put into your GI tract during the procedure and reduce the bloating. If you had a lower endoscopy (such as a colonoscopy or flexible sigmoidoscopy) you may notice spotting of blood in your stool or on the toilet paper. If you underwent a bowel prep for your procedure, you may not have a normal bowel movement for a few days.  Please Note:  You might notice some irritation and congestion in your nose or some drainage.  This is from the oxygen used during your procedure.  There is no need for concern and it should clear up in a day or so.  SYMPTOMS TO REPORT IMMEDIATELY:   Following lower endoscopy (colonoscopy or flexible sigmoidoscopy):  Excessive amounts of blood in the stool  Significant tenderness or worsening of abdominal pains  Swelling of the abdomen that is new, acute  Fever of 100F or higher   For urgent or emergent issues, a gastroenterologist can be reached at any hour by calling [phone].   DIET: Your first meal following the procedure should be a small meal and then it is ok to progress to your normal diet. Heavy or fried foods are harder to digest and may make you feel nauseous or bloated.  Likewise, meals heavy in dairy and vegetables can increase bloating.  Drink plenty of fluids but you should avoid alcoholic beverages for 24  hours.  ACTIVITY:  You should plan to take it easy for the rest of today and you should NOT DRIVE or use heavy machinery until tomorrow (because of the sedation medicines used during the test).    FOLLOW UP: Our staff will call the number listed on your records the next business day following your procedure to check on you and address any questions or concerns that you may have regarding the information given to you following your procedure. If we do not reach you, we will leave a message.  However, if you are feeling well and you are not experiencing any problems, there is no need to return our call.  We will assume that you have returned to your regular daily activities without incident.  If any biopsies were taken you will be contacted by phone or by letter within the next 1-3 weeks.  Please call us at [phone] if you have not heard about the biopsies in 3 weeks.    SIGNATURES/CONFIDENTIALITY: You and/or your care partner have signed paperwork which will be entered into your electronic medical record.  These signatures attest to the fact that that the information above on your After Visit Summary has been reviewed and is understood.  Full responsibility of the confidentiality of this discharge information lies with you and/or your care-partner.  Diverticulosis handout given Await pathology results Resume diet and medications

## 2015-06-18 NOTE — Progress Notes (Signed)
Called to room to assist during endoscopic procedure.  Patient ID and intended procedure confirmed with present staff. Received instructions for my participation in the procedure from the performing physician.  

## 2015-06-18 NOTE — Op Note (Addendum)
Kenneth Rhodes Patient Name: Kenneth Rhodes Procedure Date: 06/18/2015 9:08 AM MRN: [id number] Endoscopist: Docia Chuck. Henrene Pastor , MD Age: 70 Referring MD:  Date of Birth: 11-22-1945 Gender: Male Procedure:                Colonoscopy with biopsies Indications:              Screening for colorectal malignant neoplasm Medicines:                Monitored Anesthesia Care Procedure:                Pre-Anesthesia Assessment:                           - Prior to the procedure, a History and Physical                            was performed, and patient medications and                            allergies were reviewed. The patient's tolerance of                            previous anesthesia was also reviewed. The risks                            and benefits of the procedure and the sedation                            options and risks were discussed with the patient.                            All questions were answered, and informed consent                            was obtained. Prior Anticoagulants: The patient has                            taken no previous anticoagulant or antiplatelet                            agents. ASA Grade Assessment: II - A patient with                            mild systemic disease. After reviewing the risks                            and benefits, the patient was deemed in                            satisfactory condition to undergo the procedure.                           After obtaining informed consent, the colonoscope  was passed under direct vision. Throughout the                            procedure, the patient's blood pressure, pulse, and                            oxygen saturations were monitored continuously. The                            Model CF-HQ190L [id number]) scope was introduced                            through the anus and advanced to the the cecum,                            identified by appendiceal  orifice and ileocecal                            valve. The colonoscopy was performed without                            difficulty. The patient tolerated the procedure                            well. The quality of the bowel preparation was                            excellent. The bowel preparation used was SUPREP.                            The ileocecal valve, appendiceal orifice, and                            rectum were photographed. Scope In: 9:20:57 AM Scope Out: 9:31:43 AM Scope Withdrawal Time: 0 hours 9 minutes 38 seconds  Total Procedure Duration: 0 hours 10 minutes 46 seconds  Findings:      The terminal ileum appeared normal.      Multiple diverticula were found in the sigmoid colon.      The exam was otherwise without abnormality on direct and retroflexion       views. Random colon bx taken. Complications:            No immediate complications. Estimated Blood Loss:     Estimated blood loss: none. Impression:               - The examined portion of the ileum was normal.                           - Diverticulosis in the sigmoid colon.                           - The examination was otherwise normal on direct                            and retroflexion views.                           -  No specimens collected. Recommendation:           -Follow up biopsies.                           - Repeat colonoscopy in 10 years for screening                            purposes. Procedure Code(s):        --- Professional ---                           [phone], Colonoscopy, flexible; diagnostic, including                            collection of specimen(s) by brushing or washing,                            when performed (separate procedure) CPT copyright 2016 American Medical Association. All rights reserved. Docia Chuck. Henrene Pastor, MD 06/18/2015 9:36:03 AM This report has been signed electronically. Number of Addenda: 1 CC Letter to:             [name] M.D. Referring MD:               Addendum Number: 1   Addendum Date: 06/18/2015 9:40:51 AM                            Docia Chuck. Henrene Pastor, MD 06/18/2015 9:41:00 AM This report has been signed electronically.

## 2015-06-19 ENCOUNTER — Telehealth: Payer: Self-pay | Admitting: *Deleted

## 2015-06-19 NOTE — Telephone Encounter (Signed)
  Follow up Call-  Call back number 06/18/2015 09/30/2014  Post procedure Call Back phone  # [phone]  Permission to leave phone message Yes Yes     Patient questions:  Left message to call us if necessary.

## 2015-06-22 ENCOUNTER — Encounter (HOSPITAL_COMMUNITY): Payer: Self-pay | Admitting: Emergency Medicine

## 2015-06-22 ENCOUNTER — Emergency Department (HOSPITAL_COMMUNITY)
Admission: EM | Admit: 2015-06-22 | Discharge: 2015-06-22 | Disposition: A | Discharge status: Home / Self Care | Payer: Medicare Other | Attending: Emergency Medicine | Admitting: Emergency Medicine

## 2015-06-22 DIAGNOSIS — F329 Major depressive disorder, single episode, unspecified: Secondary | ICD-10-CM | POA: Diagnosis not present

## 2015-06-22 DIAGNOSIS — R109 Unspecified abdominal pain: Secondary | ICD-10-CM | POA: Diagnosis not present

## 2015-06-22 DIAGNOSIS — E785 Hyperlipidemia, unspecified: Secondary | ICD-10-CM | POA: Diagnosis not present

## 2015-06-22 DIAGNOSIS — E039 Hypothyroidism, unspecified: Secondary | ICD-10-CM | POA: Diagnosis not present

## 2015-06-22 DIAGNOSIS — Z79899 Other long term (current) drug therapy: Secondary | ICD-10-CM | POA: Insufficient documentation

## 2015-06-22 DIAGNOSIS — I10 Essential (primary) hypertension: Secondary | ICD-10-CM | POA: Diagnosis not present

## 2015-06-22 DIAGNOSIS — G8929 Other chronic pain: Secondary | ICD-10-CM | POA: Diagnosis not present

## 2015-06-22 DIAGNOSIS — Z87891 Personal history of nicotine dependence: Secondary | ICD-10-CM | POA: Insufficient documentation

## 2015-06-22 DIAGNOSIS — R1030 Lower abdominal pain, unspecified: Secondary | ICD-10-CM | POA: Diagnosis not present

## 2015-06-22 DIAGNOSIS — R45851 Suicidal ideations: Secondary | ICD-10-CM | POA: Diagnosis not present

## 2015-06-22 DIAGNOSIS — E669 Obesity, unspecified: Secondary | ICD-10-CM | POA: Insufficient documentation

## 2015-06-22 LAB — RAPID URINE DRUG SCREEN, HOSP PERFORMED
Amphetamines: NOT DETECTED
Barbiturates: NOT DETECTED
Benzodiazepines: NOT DETECTED
COCAINE: NOT DETECTED
OPIATES: NOT DETECTED
TETRAHYDROCANNABINOL: NOT DETECTED

## 2015-06-22 LAB — COMPREHENSIVE METABOLIC PANEL
ALK PHOS: 66 U/L (ref 38–126)
ALT: 31 U/L (ref 17–63)
AST: 22 U/L (ref 15–41)
Albumin: 4.4 g/dL (ref 3.5–5.0)
Anion gap: 8 (ref 5–15)
BILIRUBIN TOTAL: 0.4 mg/dL (ref 0.3–1.2)
BUN: 15 mg/dL (ref 6–20)
CALCIUM: 8.9 mg/dL (ref 8.9–10.3)
CO2: 24 mmol/L (ref 22–32)
Chloride: 105 mmol/L (ref 101–111)
Creatinine, Ser: 1.11 mg/dL (ref 0.61–1.24)
GFR calc Af Amer: >60 mL/min (ref >60)
GFR calc non Af Amer: >60 mL/min (ref >60)
Glucose, Bld: 97 mg/dL (ref 65–99)
POTASSIUM: 3.9 mmol/L (ref 3.5–5.1)
Sodium: 137 mmol/L (ref 135–145)
TOTAL PROTEIN: 7.5 g/dL (ref 6.5–8.1)

## 2015-06-22 LAB — URINALYSIS, ROUTINE W REFLEX MICROSCOPIC
BILIRUBIN URINE: NEGATIVE
Glucose, UA: NEGATIVE mg/dL
Hgb urine dipstick: NEGATIVE
KETONES UR: NEGATIVE mg/dL
NITRITE: NEGATIVE
PH: 8 (ref 5.0–8.0)
PROTEIN: NEGATIVE mg/dL
Specific Gravity, Urine: 1.01 (ref 1.005–1.030)

## 2015-06-22 LAB — CBC
HEMATOCRIT: 35.8 % — AB (ref 39.0–52.0)
Hemoglobin: 12.3 g/dL — ABNORMAL LOW (ref 13.0–17.0)
MCH: 28.6 pg (ref 26.0–34.0)
MCHC: 34.4 g/dL (ref 30.0–36.0)
MCV: 83.3 fL (ref 78.0–100.0)
PLATELETS: 179 10*3/uL (ref 150–400)
RBC: 4.3 MIL/uL (ref 4.22–5.81)
RDW: 13.1 % (ref 11.5–15.5)
WBC: 6.5 10*3/uL (ref 4.0–10.5)

## 2015-06-22 LAB — LIPASE, BLOOD: Lipase: 19 U/L (ref 11–51)

## 2015-06-22 LAB — URINE MICROSCOPIC-ADD ON

## 2015-06-22 LAB — ETHANOL

## 2015-06-22 MED ORDER — SODIUM CHLORIDE 0.9 % IV BOLUS (SEPSIS)
1000.0000 mL | Freq: Once | INTRAVENOUS | Status: AC
  Administered 2015-06-22: 1000 mL via INTRAVENOUS

## 2015-06-22 MED ORDER — DIGESTIVE ENZYMES PO TABS: 2.0000 | ORAL_TABLET | Freq: Every day | ORAL | Status: DC

## 2015-06-22 MED ORDER — LEVOTHYROXINE SODIUM 50 MCG PO TABS: 125.0000 ug | ORAL_TABLET | Freq: Every day | ORAL | Status: DC

## 2015-06-22 MED ORDER — SODIUM CHLORIDE 0.9 % IV SOLN
INTRAVENOUS | Status: DC
  Administered 2015-06-22: 15:00:00 via INTRAVENOUS

## 2015-06-22 MED ORDER — BUPRENORPHINE HCL 2 MG SL SUBL
12.0000 mg | SUBLINGUAL_TABLET | Freq: Every day | SUBLINGUAL | Status: DC
  Administered 2015-06-22: 12 mg via SUBLINGUAL
  Filled 2015-06-22: qty 6

## 2015-06-22 MED ORDER — TAMSULOSIN HCL 0.4 MG PO CAPS: 0.4000 mg | ORAL_CAPSULE | Freq: Two times a day (BID) | ORAL | Status: DC

## 2015-06-22 NOTE — BH Assessment (Addendum)
Tele Assessment Note   Kenneth Rhodes is a 70 y.o. male. Pt presents voluntarily to [facility] with depression and SI d/t chronic abdominal pain. Pt is cooperative and oriented x 4 during teleassessment. He denies suicidal plan or intentional. Pt says he would never kill himself because "I have three beautiful granddaughters." He says he lives with his sister and his sister's boyfriend has a gun safe in the sister's house. Pt reports he doesn't have access to the gun safe. He denies hx of suicide attempts or hx of self harm. Pt sts he saw a psychiatrist a few years ago for "depression." Pt reports his mood is "a little bit depressed." He says he has had severe abdominal pain since June 2016. He reports he has been to other hospitals in order to find cause of abdominal pain [facility]). Pt reports next step is to get appt with [facility] MD. Pt denies hx of abuse. He denies substance use. Pt does report "I had problems with pain pills". He says for the past two years he has been using suboxone. Pt says he no longer abuses pain pills since on suboxone. He reports insomina d/t abdominal pain. 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 He says he occasionally has to catheterize himself but for the most part he doesn't need the catheter.    Writer called and left voicemail for pt's sister Kenneth Rhodes [phone] to find out more info re: gun safe and pt's access to it.  Writer also asked pt's RN Kenneth Rhodes to notify Kenneth Rhodes to Microbiologist if sister comes back to ED. TC from sister Kenneth Rhodes. She reports guns are in a gun safe that has a key pad. She reports pt doesn't know the key pad code. Sister says pt said this morning that if he can't get a gun, he will kill himself another way. Sister reports concern pt will kill himself.  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 Hyperlipidemia   . MRSA (methicillin resistant staph aureus) culture positive   . Hypertension   . Hypothyroidism   . Depression   . Liver disease   . Chronic hepatitis C ([place])   . Sleep apnea   . Obesity   . GERD (gastroesophageal reflux disease)   . BPH (benign prostatic hypertrophy)   . C. difficile diarrhea   . Arthritis   . Blood transfusion without reported diagnosis   . Substance abuse     pain medication  . Hepatitis C   . Neurogenic bladder   . Bladder spasms     Past Surgical History  Procedure Laterality Date  . Spine surgery      due to mrsa  . Medial partial knee replacement Right   . Lumbar epidural injection    . Lumbar disc surgery    . Suprapubic catheter insertion    . Colonoscopy      ovr 10 yrs ago in [place]. pt said normal exam    Family History:  Family History  Problem Relation Age of Onset  . Ovarian cancer Mother   . Colon cancer Mother     dx age 22  . AAA (abdominal aortic aneurysm) Father   . Esophageal cancer Neg Hx   . Kidney disease Neg Hx   . Liver disease Neg Hx  Social History:  reports that he quit smoking about 21 years ago. His smoking use included Cigarettes. He has never used smokeless tobacco. He reports that he does not drink alcohol or use illicit drugs.  Additional Social History:  Alcohol / Drug Use Pain Medications: pt denies abuse - see pta meds list Prescriptions: pt denies abuse - see pta meds list Over the Counter: pt deneis abuse - see pta meds list History of alcohol / drug use?: No history of alcohol / drug abuse Longest period of sobriety (when/how long): n/a  CIWA: CIWA-Ar BP: 105/61 mmHg Pulse Rate: 76 COWS:    PATIENT STRENGTHS: (choose at least two) Average or above average intelligence Communication skills General fund of knowledge Supportive family/friends  Allergies:  Allergies  Allergen Reactions  . Hydrocodone Nausea Only    Home Medications:   (Not in a hospital admission)  OB/GYN Status:  No LMP for male patient.  General Assessment Data Location of Assessment: [facility] TTS Assessment: In system Is this a Tele or Face-to-Face Assessment?: Tele Assessment Is this an Initial Assessment or a Re-assessment for this encounter?: Initial Assessment Marital status: Single Is patient pregnant?: No Pregnancy Status: No Living Arrangements: Other relatives (sister) Can pt return to current living arrangement?: Yes Admission Status: Voluntary Is patient capable of signing voluntary admission?: Yes Referral Source: Self/Family/Friend Insurance type: medicare     Crisis Care Plan Living Arrangements: Other relatives (sister) Name of Psychiatrist: none Name of Therapist: none  Education Status Is patient currently in school?: No Highest grade of school patient has completed: 7 Name of school: [employer]  Risk to self with the past 6 months Suicidal Ideation: No (pt now denies) Has patient been a risk to self within the past 6 months prior to admission? : No Suicidal Intent: No (he denies intent) Has patient had any suicidal intent within the past 6 months prior to admission? : No Is patient at risk for suicide?: No Suicidal Plan?: No Has patient had any suicidal plan within the past 6 months prior to admission? : No What has been your use of drugs/alcohol within the last 12 months?: none Previous Attempts/Gestures: No How many times?: 0 Other Self Harm Risks: none Triggers for Past Attempts:  (na) Intentional Self Injurious Behavior: None Family Suicide History: No Recent stressful life event(s): Recent negative physical changes (chronic abdominal pain) Persecutory voices/beliefs?: No Depression: No Depression Symptoms: Insomnia Substance abuse history and/or treatment for substance abuse?: Yes Suicide prevention information given to non-admitted patients: Not applicable  Risk to Others within the past 6  months Homicidal Ideation: No Does patient have any lifetime risk of violence toward others beyond the six months prior to admission? : No Thoughts of Harm to Others: No Current Homicidal Intent: No Current Homicidal Plan: No Access to Homicidal Means: No Identified Victim: none History of harm to others?: No Assessment of Violence: None Noted Violent Behavior Description: pt denies hx violence Does patient have access to weapons?:  (pt denies access to sister's bf's gun safe) Criminal Charges Pending?: No Does patient have a court date: No Is patient on probation?: No  Psychosis Hallucinations: None noted Delusions: None noted  Mental Status Report Appearance/Hygiene: Unremarkable, In scrubs Eye Contact: Good Motor Activity: Freedom of movement Speech: Logical/coherent Level of Consciousness: Alert Mood: Depressed ("a little bit depressed") Affect: Appropriate to circumstance Anxiety Level: None Thought Processes: Coherent, Relevant Judgement: Unimpaired Orientation: Person, Time, Situation, Place Obsessive Compulsive Thoughts/Behaviors: None  Cognitive Functioning Concentration: Normal Memory:  Recent Intact, Remote Intact IQ: Average Insight: Fair Impulse Control: Good Appetite: Fair Sleep: Decreased Total Hours of Sleep: 5 (due to abdominal pain) Vegetative Symptoms: Staying in bed (d/t abdominal pain)  ADLScreening [facility] Assessment Services) Patient's cognitive ability adequate to safely complete daily activities?: Yes Patient able to express need for assistance with ADLs?: Yes Independently performs ADLs?: Yes (appropriate for developmental age)  Prior Inpatient Therapy Prior Inpatient Therapy: No Prior Therapy Dates: na Prior Therapy Facilty/Provider(s): na Reason for Treatment: na  Prior Outpatient Therapy Prior Outpatient Therapy: Yes Prior Therapy Dates: a few years ago Prior Therapy Facilty/Provider(s): unknown Reason for Treatment: "depression"   Does patient have an ACCT team?: No Does patient have Intensive In-House Services?  : No Does patient have [facility] services? : No Does patient have P4CC services?: No  ADL Screening (condition at time of admission) Patient's cognitive ability adequate to safely complete daily activities?: Yes Is the patient deaf or have difficulty hearing?: No Does the patient have difficulty seeing, even when wearing glasses/contacts?: No Does the patient have difficulty concentrating, remembering, or making decisions?: No Patient able to express need for assistance with ADLs?: Yes Does the patient have difficulty dressing or bathing?: No Independently performs ADLs?: Yes (appropriate for developmental age) Does the patient have difficulty walking or climbing stairs?: No Weakness of Legs: None Weakness of Arms/Hands: None  Home Assistive Devices/Equipment Home Assistive Devices/Equipment:  (catheters)    Abuse/Neglect Assessment (Assessment to be complete while patient is alone) Physical Abuse: Denies Verbal Abuse: Denies Sexual Abuse: Denies Exploitation of patient/patient's resources: Denies Self-Neglect: Denies     Regulatory affairs officer (For Healthcare) Does patient have an advance directive?: No Would patient like information on creating an advanced directive?: No - patient declined information    Additional Information 1:1 In Past 12 Months?: No CIRT Risk: No Elopement Risk: No Does patient have medical clearance?: Yes     Disposition:  Disposition Initial Assessment Completed for this Encounter: Yes Disposition of Patient: Inpatient treatment program Type of inpatient treatment program: Adult (laura davis np recommends inpatient treatment)  Courvoisier Hamblen P 06/22/2015 5:16 PM

## 2015-06-22 NOTE — ED Notes (Addendum)
Pt c/o severe stomach pain "for months". States it has gotten worse since Friday. Reports nausea and diarrhea. nad noted. Pt states he is suicidal. Pt states he plans to get a gun to shoot himself. Pt tearful, states "I am at my wits end and I have not gotten any help". Pt advised he will be placed under psychiatric holding protocols and states he understands and feels that he needs it.

## 2015-06-22 NOTE — ED Notes (Signed)
Spoke with Arby Barrette with [facility] and recommended that pt have inpatient treatment.  Waiting for placement

## 2015-06-22 NOTE — ED Notes (Signed)
Pt has decided to leave, security called to get pt's belongings that have been locked up

## 2015-06-22 NOTE — ED Notes (Signed)
Security here to lock up pt's wallet and cell phone.  Pt stated that "I think it's bullshit" but pt did allow for it, security also wanded pt and pt has changed into paper scrubs

## 2015-06-22 NOTE — ED Notes (Signed)
Pt does self cath's. Sister has gone home to get supplies for pt

## 2015-06-22 NOTE — Progress Notes (Signed)
Patient has been referred to: [facility] - accepting referrals for the waitlist. [facility], fax it for the waitlist, 1 adult male left. [facility] - per Karna Christmas, send referral. [facility] - per Ria Comment, accepting referrals.  [facility] - Medical laboratory scientific officer inquiring of bed status and requesting call back if beds are available.  At capacity: [facility]  [facility] will continue to seek placement.  Verlon Setting, [place] Disposition staff 06/22/2015 6:03 PM

## 2015-06-22 NOTE — ED Provider Notes (Addendum)
CSN: [id number]     Arrival date & time 06/22/15  1243 History   First MD Initiated Contact with Patient 06/22/15 1339     Chief Complaint  Patient presents with  . Abdominal Pain  . Suicidal    HPI The patient presents to the emergency room for depression and suicidal ideation attributed to chronic abdominal pain that at times is severe and is located primarily in the lower abdomen on the right side at this point. Patient states he's had severe abdominal pain ongoing for months. No one has been able to explain to him the cause. Patient has been treated for C. difficile with oral medications but according to his gastroenterologist the last time he was seen his C. difficile test was negative. The patient had abdominal pelvic CT scan in December that did not show any acute abnormalities. He also had an ultrasound of his right upper quadrant last month that was unremarkable. A shunt states he has seen a gastroenterologist and has had a colonoscopy. He was told they have not been able to terminate exact cause.  The patient has had 9 visits to this emergency room since July of last year and admits to going to other emergency rooms as well for this same issue. He is very frustrated.  Patient feels like he is at his wits end and is thinking about shooting himself. Past Medical History  Diagnosis Date  . Hyperlipidemia   . MRSA (methicillin resistant staph aureus) culture positive   . Hypertension   . Hypothyroidism   . Depression   . Liver disease   . Chronic hepatitis C ([place])   . Sleep apnea   . Obesity   . GERD (gastroesophageal reflux disease)   . BPH (benign prostatic hypertrophy)   . C. difficile diarrhea   . Arthritis   . Blood transfusion without reported diagnosis   . Substance abuse     pain medication  . Hepatitis C   . Neurogenic bladder   . Bladder spasms    Past Surgical History  Procedure Laterality Date  . Spine surgery      due to mrsa  . Medial partial knee replacement  Right   . Lumbar epidural injection    . Lumbar disc surgery    . Suprapubic catheter insertion    . Colonoscopy      ovr 10 yrs ago in [place]. pt said normal exam   Family History  Problem Relation Age of Onset  . Ovarian cancer Mother   . Colon cancer Mother     dx age 80  . AAA (abdominal aortic aneurysm) Father   . Esophageal cancer Neg Hx   . Kidney disease Neg Hx   . Liver disease Neg Hx    Social History  Substance Use Topics  . Smoking status: Former Smoker    Types: Cigarettes    Quit date: 03/21/1994  . Smokeless tobacco: Never Used  . Alcohol Use: No    Review of Systems  All other systems reviewed and are negative.     Allergies  Hydrocodone  Home Medications   Prior to Admission medications   Medication Sig Start Date End Date Taking? Authorizing Provider  DIGESTIVE ENZYMES PO Take 2 tablets by mouth.    Historical Provider, MD  levothyroxine (SYNTHROID, LEVOTHROID) 125 MCG tablet Take 1 tablet (125 mcg total) by mouth daily before breakfast. 05/18/15   Sharion Balloon, FNP  SUBOXONE 12-3 MG FILM Take 1 Film by mouth daily.  04/24/14   Historical Provider, MD  tamsulosin (FLOMAX) 0.4 MG CAPS capsule Take 0.4 mg by mouth 2 (two) times daily.  01/31/14   Historical Provider, MD   BP 105/61 mmHg  Pulse 76  Temp(Src) 98.3 F (36.8 C) (Oral)  Resp 18  Ht 6\' 1"  (1.854 m)  Wt 113.399 kg  BMI 32.99 kg/m2  SpO2 99% Physical Exam  Constitutional: He appears well-developed and well-nourished. No distress.  HENT:  Head: Normocephalic and atraumatic.  Right Ear: External ear normal.  Left Ear: External ear normal.  Eyes: Conjunctivae are normal. Right eye exhibits no discharge. Left eye exhibits no discharge. No scleral icterus.  Neck: Neck supple. No tracheal deviation present.  Cardiovascular: Normal rate, regular rhythm and intact distal pulses.   Pulmonary/Chest: Effort normal and breath sounds normal. No stridor. No respiratory distress. He has no  wheezes. He has no rales.  Abdominal: Soft. Bowel sounds are normal. He exhibits no distension. There is no tenderness. There is no rebound and no guarding.  Musculoskeletal: He exhibits no edema or tenderness.  Neurological: He is alert. He has normal strength. No cranial nerve deficit (no facial droop, extraocular movements intact, no slurred speech) or sensory deficit. He exhibits normal muscle tone. He displays no seizure activity. Coordination normal.  Skin: Skin is warm and dry. No rash noted.  Psychiatric: He has a normal mood and affect.  Nursing note and vitals reviewed.   ED Course  Procedures (including critical care time) Labs Review Labs Reviewed  CBC - Abnormal; Notable for the following:    Hemoglobin 12.3 (*)    HCT 35.8 (*)    All other components within normal limits  URINALYSIS, ROUTINE W REFLEX MICROSCOPIC (NOT AT [facility]) - Abnormal; Notable for the following:    Leukocytes, UA TRACE (*)    All other components within normal limits  URINE MICROSCOPIC-ADD ON - Abnormal; Notable for the following:    Squamous Epithelial / LPF 0-5 (*)    Bacteria, UA RARE (*)    All other components within normal limits  LIPASE, BLOOD  COMPREHENSIVE METABOLIC PANEL  ETHANOL  URINE RAPID DRUG SCREEN, HOSP PERFORMED      MDM   Final diagnoses:  Chronic abdominal pain  Suicidal ideation    Patient has been having trouble with abdominal pain for months. He has had evaluation in the past that included abdominal CT scans, ultrasounds, and GI follow-up with colonoscopy. Causes pain is unclear. Patient thinks it could be related to his chronic pain medications.  He does not have any concerning findings on physical exam. Laboratory tests are unremarkable. I do not feel that repeat CAT scan imaging is necessary at this time. I do not know the etiology of this abdominal pain for him but I think from a emergency standpoint it is safe for him to continue follow-up as an  outpatient.  Unfortunately this pain is causing  severe emotional distress for him. Patient is feeling suicidal. I will consult with the psychiatry service.  He is medically clear   Dorie Rank, MD 06/22/15 1552  Patient was assessed by psychiatry. They recommended inpatient treatment. The patient has been here for several hours now and states he is feeling better and does not want to be admitted to a psychiatric hospital. She states his main issue is the abdominal pain. He feels like he had a bad night last night because of the abdominal pain. Recently he had been told that his abdominal pain issues was from  his C. difficile and in the last 2 weeks he was told that that was no longer an active issue. Patient noticed that when he was given the Subutex here as opposed to the Suboxone that he usually takes he did not have the abdominal pain cramping. He would like to follow up with his pain doctor and see about altering his chronic pain medications.  Patient would like to follow-up with an outpatient psychiatrist.   He does feel that he may be having some issues with depression but is not suicidal. Patient will contract for safety and has no desire to harm himself  Dorie Rank, MD 06/22/15 2124

## 2015-06-22 NOTE — Discharge Instructions (Signed)
Major Depressive Disorder Major depressive disorder is a mental illness. It also may be called clinical depression or unipolar depression. Major depressive disorder usually causes feelings of sadness, hopelessness, or helplessness. Some people with this disorder do not feel particularly sad but lose interest in doing things they used to enjoy (anhedonia). Major depressive disorder also can cause physical symptoms. It can interfere with work, school, relationships, and other normal everyday activities. The disorder varies in severity but is longer lasting and more serious than the sadness we all feel from time to time in our lives. Major depressive disorder often is triggered by stressful life events or major life changes. Examples of these triggers include divorce, loss of your job or home, a move, and the death of a family member or close friend. Sometimes this disorder occurs for no obvious reason at all. People who have family members with major depressive disorder or bipolar disorder are at higher risk for developing this disorder, with or without life stressors. Major depressive disorder can occur at any age. It may occur just once in your life (single episode major depressive disorder). It may occur multiple times (recurrent major depressive disorder). SYMPTOMS People with major depressive disorder have either anhedonia or depressed mood on nearly a daily basis for at least 2 weeks or longer. Symptoms of depressed mood include:  Feelings of sadness (blue or down in the dumps) or emptiness.  Feelings of hopelessness or helplessness.  Tearfulness or episodes of crying (may be observed by others).  Irritability (children and adolescents). In addition to depressed mood or anhedonia or both, people with this disorder have at least four of the following symptoms:  Difficulty sleeping or sleeping too much.   Significant change (increase or decrease) in appetite or weight.   Lack of energy or  motivation.  Feelings of guilt and worthlessness.   Difficulty concentrating, remembering, or making decisions.  Unusually slow movement (psychomotor retardation) or restlessness (as observed by others).   Recurrent wishes for death, recurrent thoughts of self-harm (suicide), or a suicide attempt. People with major depressive disorder commonly have persistent negative thoughts about themselves, other people, and the world. People with severe major depressive disorder may experiencedistorted beliefs or perceptions about the world (psychotic delusions). They also may see or hear things that are not real (psychotic hallucinations). DIAGNOSIS Major depressive disorder is diagnosed through an assessment by your health care provider. Your health care provider will ask aboutaspects of your daily life, such as mood,sleep, and appetite, to see if you have the diagnostic symptoms of major depressive disorder. Your health care provider may ask about your medical history and use of alcohol or drugs, including prescription medicines. Your health care provider also may do a physical exam and blood work. This is because certain medical conditions and the use of certain substances can cause major depressive disorder-like symptoms (secondary depression). Your health care provider also may refer you to a mental health specialist for further evaluation and treatment. TREATMENT It is important to recognize the symptoms of major depressive disorder and seek treatment. The following treatments can be prescribed for this disorder:   Medicine. Antidepressant medicines usually are prescribed. Antidepressant medicines are thought to correct chemical imbalances in the brain that are commonly associated with major depressive disorder. Other types of medicine may be added if the symptoms do not respond to antidepressant medicines alone or if psychotic delusions or hallucinations occur.  Talk therapy. Talk therapy can be  helpful in treating major depressive disorder by providing   support, education, and guidance. Certain types of talk therapy also can help with negative thinking (cognitive behavioral therapy) and with relationship issues that trigger this disorder (interpersonal therapy). A mental health specialist can help determine which treatment is best for you. Most people with major depressive disorder do well with a combination of medicine and talk therapy. Treatments involving electrical stimulation of the brain can be used in situations with extremely severe symptoms or when medicine and talk therapy do not work over time. These treatments include electroconvulsive therapy, transcranial magnetic stimulation, and vagal nerve stimulation.   This information is not intended to replace advice given to you by your health care provider. Make sure you discuss any questions you have with your health care provider.   Document Released: 07/02/2012 Document Revised: 03/28/2014 Document Reviewed: 07/02/2012 Elsevier Interactive Patient Education 2016 Elsevier Inc.  

## 2015-06-22 NOTE — ED Notes (Addendum)
Pt wanting to go home. EDP made aware and spoke with pt.  EDP states he will not IVC pt but not d/c pt either since it has been recommended that pt have inpt treatment, pt is able to leave AMA. Pt stated that SI was due to the "pain talking" and denies SI at present.   Explained to pt this and that insurance will not cover if pt decides to leave ama.  PT wants to speak with sister before deciding.

## 2015-06-23 ENCOUNTER — Encounter: Payer: Self-pay | Admitting: Internal Medicine

## 2015-06-29 DIAGNOSIS — B182 Chronic viral hepatitis C: Secondary | ICD-10-CM | POA: Diagnosis not present

## 2015-07-02 ENCOUNTER — Ambulatory Visit (INDEPENDENT_AMBULATORY_CARE_PROVIDER_SITE_OTHER): Payer: Medicare Other | Admitting: Family Medicine

## 2015-07-02 ENCOUNTER — Encounter: Payer: Self-pay | Admitting: Family Medicine

## 2015-07-02 VITALS — BP 137/83 | HR 71 | Temp 97.2°F | Ht 73.0 in | Wt 259.4 lb

## 2015-07-02 DIAGNOSIS — R109 Unspecified abdominal pain: Secondary | ICD-10-CM | POA: Diagnosis not present

## 2015-07-02 DIAGNOSIS — R197 Diarrhea, unspecified: Secondary | ICD-10-CM | POA: Diagnosis not present

## 2015-07-02 MED ORDER — DICYCLOMINE HCL 10 MG PO CAPS: 10.0000 mg | ORAL_CAPSULE | Freq: Three times a day (TID) | ORAL | Status: DC

## 2015-07-02 NOTE — Patient Instructions (Signed)
Great to meet you!  We will call with your results within a week (considering they come back in a timely manner through the holiday weekend). If it is positive we will send antibiotics and notify Dr. Henrene Pastor.   Try bentyl in the meantime.

## 2015-07-02 NOTE — Progress Notes (Signed)
   HPI  Patient presents today here with abdominal cramps and diarrhea.  Patient explains that he has a recent history of recurrent C. difficile infections.  Patient explains that his last present in February he was treated with vancomycin orally. Over the last 2 weeks he describes worsening diarrhea, 5+ episodes a day, distinct foul odor to the stool, and worsening crampy abdominal pain. He denies any fevers, he is tolerating food and fluids normally, and he does not have any malaise.  He has had a recent colonoscopy by Dr. Henrene Pastor showing diverticulosis in the sigmoid colon  PMH: Smoking status noted ROS: Per HPI  Objective: BP 137/83 mmHg  Pulse 71  Temp(Src) 97.2 F (36.2 C) (Oral)  Ht 6\' 1"  (1.854 m)  Wt 259 lb 6.4 oz (117.663 kg)  BMI 34.23 kg/m2 Gen: NAD, alert, cooperative with exam HEENT: NCAT, moist mucous membranes CV: RRR, good S1/S2, no murmur Resp: CTABL, no wheezes, non-labored Abd: Soft, normal to hyperactive bowel sounds, mild tenderness to palpation of the left lower quadrant Ext: No edema, warm Neuro: Alert and oriented, No gross deficits  Assessment and plan:  # Diarrhea With his history it is concerning for C. difficile, C. difficile by PCR has been ordered. He brought in his stool sample today In the meantime I will try Bentyl for abdominal cramps He does have recently documented diverticulosis, however considering his overall clinical presentation and very mild abdominal pain I do not believe he has diverticulitis at this time. If results are positive will treat with vancomycin and notify his GI physician, Dr. Henrene Pastor.  Reasons to return and seek medical care have been reviewed     Orders Placed This Encounter  Procedures  . Clostridium Difficile by PCR    Order Specific Question:  Is your patient experiencing loose or watery stools (3 or more in 24 hours)?    Answer:  Yes    Order Specific Question:  Has the patient received laxatives in the last  24 hours?    Answer:  No    Order Specific Question:  Has a negative Cdiff test resulted in the last 7 days?    Answer:  No    Meds ordered this encounter  Medications  . dicyclomine (BENTYL) 10 MG capsule    Sig: Take 1 capsule (10 mg total) by mouth 4 (four) times daily -  before meals and at bedtime.    Dispense:  120 capsule    Refill:  [place], MD [facility] 07/02/2015, 12:34 PM

## 2015-07-03 LAB — CLOSTRIDIUM DIFFICILE BY PCR: CDIFFPCR: NEGATIVE

## 2015-07-31 DIAGNOSIS — R1084 Generalized abdominal pain: Secondary | ICD-10-CM | POA: Diagnosis not present

## 2015-07-31 DIAGNOSIS — M5416 Radiculopathy, lumbar region: Secondary | ICD-10-CM | POA: Diagnosis not present

## 2015-07-31 DIAGNOSIS — M545 Low back pain: Secondary | ICD-10-CM | POA: Diagnosis not present

## 2015-08-05 DIAGNOSIS — B1920 Unspecified viral hepatitis C without hepatic coma: Secondary | ICD-10-CM | POA: Diagnosis not present

## 2015-08-05 DIAGNOSIS — K5900 Constipation, unspecified: Secondary | ICD-10-CM | POA: Diagnosis not present

## 2015-08-05 DIAGNOSIS — R197 Diarrhea, unspecified: Secondary | ICD-10-CM | POA: Diagnosis not present

## 2015-08-21 DIAGNOSIS — K5900 Constipation, unspecified: Secondary | ICD-10-CM | POA: Diagnosis not present

## 2015-08-21 DIAGNOSIS — M545 Low back pain: Secondary | ICD-10-CM | POA: Diagnosis not present

## 2015-08-21 DIAGNOSIS — R270 Ataxia, unspecified: Secondary | ICD-10-CM | POA: Diagnosis not present

## 2015-08-21 DIAGNOSIS — R1084 Generalized abdominal pain: Secondary | ICD-10-CM | POA: Diagnosis not present

## 2015-08-21 DIAGNOSIS — G894 Chronic pain syndrome: Secondary | ICD-10-CM | POA: Diagnosis not present

## 2015-08-21 DIAGNOSIS — Z79899 Other long term (current) drug therapy: Secondary | ICD-10-CM | POA: Diagnosis not present

## 2015-08-31 ENCOUNTER — Other Ambulatory Visit: Payer: Self-pay | Admitting: Family Medicine

## 2015-09-01 DIAGNOSIS — I10 Essential (primary) hypertension: Secondary | ICD-10-CM | POA: Diagnosis not present

## 2015-09-01 DIAGNOSIS — Z79899 Other long term (current) drug therapy: Secondary | ICD-10-CM | POA: Diagnosis not present

## 2015-09-01 DIAGNOSIS — Z8619 Personal history of other infectious and parasitic diseases: Secondary | ICD-10-CM | POA: Diagnosis not present

## 2015-09-01 DIAGNOSIS — E039 Hypothyroidism, unspecified: Secondary | ICD-10-CM | POA: Diagnosis not present

## 2015-09-01 DIAGNOSIS — Z87891 Personal history of nicotine dependence: Secondary | ICD-10-CM | POA: Diagnosis not present

## 2015-09-01 DIAGNOSIS — G8929 Other chronic pain: Secondary | ICD-10-CM | POA: Diagnosis not present

## 2015-09-01 DIAGNOSIS — R1031 Right lower quadrant pain: Secondary | ICD-10-CM | POA: Diagnosis not present

## 2015-09-01 DIAGNOSIS — Z8614 Personal history of Methicillin resistant Staphylococcus aureus infection: Secondary | ICD-10-CM | POA: Diagnosis not present

## 2015-09-04 ENCOUNTER — Other Ambulatory Visit: Payer: Self-pay | Admitting: Gastroenterology

## 2015-09-04 DIAGNOSIS — R197 Diarrhea, unspecified: Secondary | ICD-10-CM | POA: Diagnosis not present

## 2015-09-04 DIAGNOSIS — R1031 Right lower quadrant pain: Secondary | ICD-10-CM

## 2015-09-07 DIAGNOSIS — R1031 Right lower quadrant pain: Secondary | ICD-10-CM | POA: Diagnosis not present

## 2015-09-14 ENCOUNTER — Ambulatory Visit
Admission: RE | Admit: 2015-09-14 | Discharge: 2015-09-14 | Disposition: A | Discharge status: Home / Self Care | Payer: Medicare Other | Source: Ambulatory Visit | Attending: Gastroenterology | Admitting: Gastroenterology

## 2015-09-14 DIAGNOSIS — N281 Cyst of kidney, acquired: Secondary | ICD-10-CM | POA: Diagnosis not present

## 2015-09-14 DIAGNOSIS — R1031 Right lower quadrant pain: Secondary | ICD-10-CM

## 2015-09-14 MED ORDER — IOPAMIDOL (ISOVUE-300) INJECTION 61%
100.0000 mL | Freq: Once | INTRAVENOUS | Status: AC | PRN
  Administered 2015-09-14: 100 mL via INTRAVENOUS

## 2015-09-15 DIAGNOSIS — B182 Chronic viral hepatitis C: Secondary | ICD-10-CM | POA: Diagnosis not present

## 2015-09-15 DIAGNOSIS — K5900 Constipation, unspecified: Secondary | ICD-10-CM | POA: Diagnosis not present

## 2015-09-28 DIAGNOSIS — Z8619 Personal history of other infectious and parasitic diseases: Secondary | ICD-10-CM | POA: Diagnosis not present

## 2015-09-28 DIAGNOSIS — K7469 Other cirrhosis of liver: Secondary | ICD-10-CM | POA: Diagnosis not present

## 2015-09-30 ENCOUNTER — Other Ambulatory Visit: Payer: Self-pay | Admitting: Nurse Practitioner

## 2015-09-30 DIAGNOSIS — K7469 Other cirrhosis of liver: Secondary | ICD-10-CM

## 2015-10-14 DIAGNOSIS — M5416 Radiculopathy, lumbar region: Secondary | ICD-10-CM | POA: Diagnosis not present

## 2015-10-14 DIAGNOSIS — F419 Anxiety disorder, unspecified: Secondary | ICD-10-CM | POA: Diagnosis not present

## 2015-10-14 DIAGNOSIS — Z79891 Long term (current) use of opiate analgesic: Secondary | ICD-10-CM | POA: Diagnosis not present

## 2015-10-14 DIAGNOSIS — M1990 Unspecified osteoarthritis, unspecified site: Secondary | ICD-10-CM | POA: Diagnosis not present

## 2015-10-15 DIAGNOSIS — B1920 Unspecified viral hepatitis C without hepatic coma: Secondary | ICD-10-CM | POA: Diagnosis not present

## 2015-10-15 DIAGNOSIS — K5900 Constipation, unspecified: Secondary | ICD-10-CM | POA: Diagnosis not present

## 2015-10-23 ENCOUNTER — Ambulatory Visit
Admission: RE | Admit: 2015-10-23 | Discharge: 2015-10-23 | Disposition: A | Discharge status: Home / Self Care | Payer: Medicare Other | Source: Ambulatory Visit | Attending: Nurse Practitioner | Admitting: Nurse Practitioner

## 2015-10-23 DIAGNOSIS — K7460 Unspecified cirrhosis of liver: Secondary | ICD-10-CM | POA: Diagnosis not present

## 2015-10-23 DIAGNOSIS — K7469 Other cirrhosis of liver: Secondary | ICD-10-CM

## 2015-10-30 ENCOUNTER — Other Ambulatory Visit: Payer: Self-pay | Admitting: Orthopedic Surgery

## 2015-10-30 ENCOUNTER — Ambulatory Visit
Admission: RE | Admit: 2015-10-30 | Discharge: 2015-10-30 | Disposition: A | Discharge status: Home / Self Care | Payer: Medicare Other | Source: Ambulatory Visit | Attending: Orthopedic Surgery | Admitting: Orthopedic Surgery

## 2015-10-30 DIAGNOSIS — M545 Low back pain: Secondary | ICD-10-CM

## 2015-10-30 DIAGNOSIS — M4806 Spinal stenosis, lumbar region: Secondary | ICD-10-CM | POA: Diagnosis not present

## 2015-11-03 ENCOUNTER — Telehealth: Payer: Self-pay | Admitting: Family Medicine

## 2015-11-03 NOTE — Telephone Encounter (Signed)
Will call back to schedule.

## 2015-11-05 DIAGNOSIS — M5125 Other intervertebral disc displacement, thoracolumbar region: Secondary | ICD-10-CM | POA: Diagnosis not present

## 2015-11-05 DIAGNOSIS — M4015 Other secondary kyphosis, thoracolumbar region: Secondary | ICD-10-CM | POA: Diagnosis not present

## 2015-11-05 DIAGNOSIS — M4714 Other spondylosis with myelopathy, thoracic region: Secondary | ICD-10-CM | POA: Diagnosis not present

## 2015-11-09 DIAGNOSIS — M545 Low back pain: Secondary | ICD-10-CM | POA: Diagnosis not present

## 2015-11-09 DIAGNOSIS — M9982 Other biomechanical lesions of thoracic region: Secondary | ICD-10-CM | POA: Diagnosis not present

## 2015-11-09 DIAGNOSIS — M5125 Other intervertebral disc displacement, thoracolumbar region: Secondary | ICD-10-CM | POA: Diagnosis not present

## 2015-11-09 DIAGNOSIS — M4805 Spinal stenosis, thoracolumbar region: Secondary | ICD-10-CM | POA: Diagnosis not present

## 2015-11-09 DIAGNOSIS — M4806 Spinal stenosis, lumbar region: Secondary | ICD-10-CM | POA: Diagnosis not present

## 2015-11-09 DIAGNOSIS — Z981 Arthrodesis status: Secondary | ICD-10-CM | POA: Diagnosis not present

## 2015-11-09 DIAGNOSIS — M4804 Spinal stenosis, thoracic region: Secondary | ICD-10-CM | POA: Diagnosis not present

## 2015-11-11 DIAGNOSIS — Z79891 Long term (current) use of opiate analgesic: Secondary | ICD-10-CM | POA: Diagnosis not present

## 2015-11-11 DIAGNOSIS — M1990 Unspecified osteoarthritis, unspecified site: Secondary | ICD-10-CM | POA: Diagnosis not present

## 2015-11-11 DIAGNOSIS — M5416 Radiculopathy, lumbar region: Secondary | ICD-10-CM | POA: Diagnosis not present

## 2015-11-11 DIAGNOSIS — G894 Chronic pain syndrome: Secondary | ICD-10-CM | POA: Diagnosis not present

## 2015-11-16 DIAGNOSIS — M4714 Other spondylosis with myelopathy, thoracic region: Secondary | ICD-10-CM | POA: Diagnosis not present

## 2015-11-16 DIAGNOSIS — M5125 Other intervertebral disc displacement, thoracolumbar region: Secondary | ICD-10-CM | POA: Diagnosis not present

## 2015-11-16 DIAGNOSIS — M4015 Other secondary kyphosis, thoracolumbar region: Secondary | ICD-10-CM | POA: Diagnosis not present

## 2015-11-21 ENCOUNTER — Other Ambulatory Visit: Payer: Self-pay | Admitting: Family

## 2015-11-24 NOTE — Telephone Encounter (Signed)
I do not see where he has had thyroid checked in the last year. We can refill 10 pills at the same dose with expectation that he will come during that time and have a TSH done

## 2015-11-24 NOTE — Telephone Encounter (Signed)
Defer to [place], MD [facility] 11/24/2015, 12:21 PM

## 2015-11-25 NOTE — Telephone Encounter (Signed)
lmovm NTBS

## 2015-12-03 ENCOUNTER — Telehealth: Payer: Self-pay | Admitting: Family Medicine

## 2015-12-03 ENCOUNTER — Other Ambulatory Visit: Payer: Self-pay

## 2015-12-03 MED ORDER — LEVOTHYROXINE SODIUM 125 MCG PO TABS: ORAL_TABLET | ORAL | 0 refills | Status: DC

## 2015-12-03 NOTE — Telephone Encounter (Signed)
lmovm that refill hs been sent to pharmacy

## 2015-12-03 NOTE — Telephone Encounter (Signed)
done

## 2015-12-03 NOTE — Telephone Encounter (Signed)
I still do not see where he has had thyroid checked within the last year but I think it would be better to refill medicine at same dose if we cannot get him in, given his circumstances as outlined in your note

## 2015-12-03 NOTE — Telephone Encounter (Signed)
Please address   Seen 07/02/15  Dr Wendi Snipes  Did not have thyroid labs at hospital that I see  Dr Sabra Heck PCP

## 2015-12-07 ENCOUNTER — Telehealth: Payer: Self-pay | Admitting: Family Medicine

## 2015-12-07 NOTE — Telephone Encounter (Signed)
Informed patient's daughter(Jill) that patient will need to be seen before we can make a referral to home health.

## 2015-12-09 DIAGNOSIS — G894 Chronic pain syndrome: Secondary | ICD-10-CM | POA: Diagnosis not present

## 2015-12-09 DIAGNOSIS — M5416 Radiculopathy, lumbar region: Secondary | ICD-10-CM | POA: Diagnosis not present

## 2015-12-09 DIAGNOSIS — M1990 Unspecified osteoarthritis, unspecified site: Secondary | ICD-10-CM | POA: Diagnosis not present

## 2015-12-09 DIAGNOSIS — Z79891 Long term (current) use of opiate analgesic: Secondary | ICD-10-CM | POA: Diagnosis not present

## 2015-12-17 ENCOUNTER — Ambulatory Visit: Payer: Medicare Other | Admitting: Family Medicine

## 2015-12-21 DIAGNOSIS — E039 Hypothyroidism, unspecified: Secondary | ICD-10-CM | POA: Diagnosis not present

## 2015-12-21 DIAGNOSIS — M5124 Other intervertebral disc displacement, thoracic region: Secondary | ICD-10-CM | POA: Diagnosis not present

## 2015-12-21 DIAGNOSIS — M963 Postlaminectomy kyphosis: Secondary | ICD-10-CM | POA: Diagnosis not present

## 2015-12-21 DIAGNOSIS — Q7649 Other congenital malformations of spine, not associated with scoliosis: Secondary | ICD-10-CM | POA: Diagnosis not present

## 2015-12-21 DIAGNOSIS — Z96652 Presence of left artificial knee joint: Secondary | ICD-10-CM | POA: Diagnosis present

## 2015-12-21 DIAGNOSIS — G9529 Other cord compression: Secondary | ICD-10-CM | POA: Diagnosis not present

## 2015-12-21 DIAGNOSIS — K219 Gastro-esophageal reflux disease without esophagitis: Secondary | ICD-10-CM | POA: Diagnosis present

## 2015-12-21 DIAGNOSIS — M6281 Muscle weakness (generalized): Secondary | ICD-10-CM | POA: Diagnosis not present

## 2015-12-21 DIAGNOSIS — Z886 Allergy status to analgesic agent status: Secondary | ICD-10-CM | POA: Diagnosis not present

## 2015-12-21 DIAGNOSIS — Z87891 Personal history of nicotine dependence: Secondary | ICD-10-CM | POA: Diagnosis not present

## 2015-12-21 DIAGNOSIS — Z809 Family history of malignant neoplasm, unspecified: Secondary | ICD-10-CM | POA: Diagnosis not present

## 2015-12-21 DIAGNOSIS — G4730 Sleep apnea, unspecified: Secondary | ICD-10-CM | POA: Diagnosis not present

## 2015-12-21 DIAGNOSIS — F418 Other specified anxiety disorders: Secondary | ICD-10-CM | POA: Diagnosis not present

## 2015-12-21 DIAGNOSIS — I10 Essential (primary) hypertension: Secondary | ICD-10-CM | POA: Diagnosis not present

## 2015-12-21 DIAGNOSIS — E038 Other specified hypothyroidism: Secondary | ICD-10-CM | POA: Diagnosis not present

## 2015-12-21 DIAGNOSIS — Z743 Need for continuous supervision: Secondary | ICD-10-CM | POA: Diagnosis not present

## 2015-12-21 DIAGNOSIS — M48061 Spinal stenosis, lumbar region without neurogenic claudication: Secondary | ICD-10-CM | POA: Diagnosis not present

## 2015-12-21 DIAGNOSIS — Z4789 Encounter for other orthopedic aftercare: Secondary | ICD-10-CM | POA: Diagnosis not present

## 2015-12-21 DIAGNOSIS — N400 Enlarged prostate without lower urinary tract symptoms: Secondary | ICD-10-CM | POA: Diagnosis not present

## 2015-12-21 DIAGNOSIS — M5125 Other intervertebral disc displacement, thoracolumbar region: Secondary | ICD-10-CM | POA: Diagnosis not present

## 2015-12-21 DIAGNOSIS — M4714 Other spondylosis with myelopathy, thoracic region: Secondary | ICD-10-CM | POA: Diagnosis not present

## 2015-12-21 DIAGNOSIS — Z8261 Family history of arthritis: Secondary | ICD-10-CM | POA: Diagnosis not present

## 2015-12-21 DIAGNOSIS — M4715 Other spondylosis with myelopathy, thoracolumbar region: Secondary | ICD-10-CM | POA: Diagnosis not present

## 2015-12-21 DIAGNOSIS — M47816 Spondylosis without myelopathy or radiculopathy, lumbar region: Secondary | ICD-10-CM | POA: Diagnosis not present

## 2015-12-21 DIAGNOSIS — R2689 Other abnormalities of gait and mobility: Secondary | ICD-10-CM | POA: Diagnosis not present

## 2015-12-21 DIAGNOSIS — M5126 Other intervertebral disc displacement, lumbar region: Secondary | ICD-10-CM | POA: Diagnosis not present

## 2015-12-21 DIAGNOSIS — M4015 Other secondary kyphosis, thoracolumbar region: Secondary | ICD-10-CM | POA: Diagnosis not present

## 2015-12-21 DIAGNOSIS — M545 Low back pain: Secondary | ICD-10-CM | POA: Diagnosis not present

## 2015-12-21 DIAGNOSIS — N390 Urinary tract infection, site not specified: Secondary | ICD-10-CM | POA: Diagnosis not present

## 2015-12-21 DIAGNOSIS — G9589 Other specified diseases of spinal cord: Secondary | ICD-10-CM | POA: Diagnosis not present

## 2015-12-21 DIAGNOSIS — M40204 Unspecified kyphosis, thoracic region: Secondary | ICD-10-CM | POA: Diagnosis not present

## 2015-12-21 DIAGNOSIS — R279 Unspecified lack of coordination: Secondary | ICD-10-CM | POA: Diagnosis not present

## 2015-12-21 DIAGNOSIS — Y838 Other surgical procedures as the cause of abnormal reaction of the patient, or of later complication, without mention of misadventure at the time of the procedure: Secondary | ICD-10-CM | POA: Diagnosis not present

## 2015-12-21 DIAGNOSIS — M069 Rheumatoid arthritis, unspecified: Secondary | ICD-10-CM | POA: Diagnosis not present

## 2015-12-21 DIAGNOSIS — Z981 Arthrodesis status: Secondary | ICD-10-CM | POA: Diagnosis not present

## 2015-12-21 DIAGNOSIS — M40295 Other kyphosis, thoracolumbar region: Secondary | ICD-10-CM | POA: Diagnosis not present

## 2015-12-21 DIAGNOSIS — Z8614 Personal history of Methicillin resistant Staphylococcus aureus infection: Secondary | ICD-10-CM | POA: Diagnosis not present

## 2015-12-21 DIAGNOSIS — K5900 Constipation, unspecified: Secondary | ICD-10-CM | POA: Diagnosis not present

## 2015-12-21 DIAGNOSIS — R278 Other lack of coordination: Secondary | ICD-10-CM | POA: Diagnosis not present

## 2015-12-21 DIAGNOSIS — Z79899 Other long term (current) drug therapy: Secondary | ICD-10-CM | POA: Diagnosis not present

## 2015-12-21 DIAGNOSIS — Z95810 Presence of automatic (implantable) cardiac defibrillator: Secondary | ICD-10-CM | POA: Diagnosis not present

## 2015-12-28 DIAGNOSIS — K219 Gastro-esophageal reflux disease without esophagitis: Secondary | ICD-10-CM | POA: Diagnosis not present

## 2015-12-28 DIAGNOSIS — Z23 Encounter for immunization: Secondary | ICD-10-CM | POA: Diagnosis not present

## 2015-12-28 DIAGNOSIS — Z4789 Encounter for other orthopedic aftercare: Secondary | ICD-10-CM | POA: Diagnosis not present

## 2015-12-28 DIAGNOSIS — N400 Enlarged prostate without lower urinary tract symptoms: Secondary | ICD-10-CM | POA: Diagnosis not present

## 2015-12-28 DIAGNOSIS — M6281 Muscle weakness (generalized): Secondary | ICD-10-CM | POA: Diagnosis not present

## 2015-12-28 DIAGNOSIS — M4324 Fusion of spine, thoracic region: Secondary | ICD-10-CM | POA: Diagnosis not present

## 2015-12-28 DIAGNOSIS — M40295 Other kyphosis, thoracolumbar region: Secondary | ICD-10-CM | POA: Diagnosis not present

## 2015-12-28 DIAGNOSIS — R279 Unspecified lack of coordination: Secondary | ICD-10-CM | POA: Diagnosis not present

## 2015-12-28 DIAGNOSIS — M4326 Fusion of spine, lumbar region: Secondary | ICD-10-CM | POA: Diagnosis not present

## 2015-12-28 DIAGNOSIS — G9589 Other specified diseases of spinal cord: Secondary | ICD-10-CM | POA: Diagnosis not present

## 2015-12-28 DIAGNOSIS — I10 Essential (primary) hypertension: Secondary | ICD-10-CM | POA: Diagnosis not present

## 2015-12-28 DIAGNOSIS — E038 Other specified hypothyroidism: Secondary | ICD-10-CM | POA: Diagnosis not present

## 2015-12-28 DIAGNOSIS — G9529 Other cord compression: Secondary | ICD-10-CM | POA: Diagnosis not present

## 2015-12-28 DIAGNOSIS — K5900 Constipation, unspecified: Secondary | ICD-10-CM | POA: Diagnosis not present

## 2015-12-28 DIAGNOSIS — R278 Other lack of coordination: Secondary | ICD-10-CM | POA: Diagnosis not present

## 2015-12-28 DIAGNOSIS — Z981 Arthrodesis status: Secondary | ICD-10-CM | POA: Diagnosis not present

## 2015-12-28 DIAGNOSIS — R2689 Other abnormalities of gait and mobility: Secondary | ICD-10-CM | POA: Diagnosis not present

## 2015-12-28 DIAGNOSIS — M545 Low back pain: Secondary | ICD-10-CM | POA: Diagnosis not present

## 2015-12-28 DIAGNOSIS — Z743 Need for continuous supervision: Secondary | ICD-10-CM | POA: Diagnosis not present

## 2015-12-28 DIAGNOSIS — N390 Urinary tract infection, site not specified: Secondary | ICD-10-CM | POA: Diagnosis not present

## 2015-12-28 DIAGNOSIS — M438X6 Other specified deforming dorsopathies, lumbar region: Secondary | ICD-10-CM | POA: Diagnosis not present

## 2015-12-30 DIAGNOSIS — Z23 Encounter for immunization: Secondary | ICD-10-CM | POA: Diagnosis not present

## 2016-02-21 DIAGNOSIS — K219 Gastro-esophageal reflux disease without esophagitis: Secondary | ICD-10-CM | POA: Diagnosis not present

## 2016-02-21 DIAGNOSIS — N400 Enlarged prostate without lower urinary tract symptoms: Secondary | ICD-10-CM | POA: Diagnosis not present

## 2016-02-21 DIAGNOSIS — G9589 Other specified diseases of spinal cord: Secondary | ICD-10-CM | POA: Diagnosis not present

## 2016-02-21 DIAGNOSIS — M545 Low back pain: Secondary | ICD-10-CM | POA: Diagnosis not present

## 2016-02-21 DIAGNOSIS — E038 Other specified hypothyroidism: Secondary | ICD-10-CM | POA: Diagnosis not present

## 2016-03-31 DIAGNOSIS — M4324 Fusion of spine, thoracic region: Secondary | ICD-10-CM | POA: Diagnosis not present

## 2016-03-31 DIAGNOSIS — M438X6 Other specified deforming dorsopathies, lumbar region: Secondary | ICD-10-CM | POA: Diagnosis not present

## 2016-03-31 DIAGNOSIS — M4326 Fusion of spine, lumbar region: Secondary | ICD-10-CM | POA: Diagnosis not present

## 2016-03-31 DIAGNOSIS — Z981 Arthrodesis status: Secondary | ICD-10-CM | POA: Diagnosis not present

## 2016-04-03 DIAGNOSIS — K219 Gastro-esophageal reflux disease without esophagitis: Secondary | ICD-10-CM | POA: Diagnosis not present

## 2016-04-03 DIAGNOSIS — M545 Low back pain: Secondary | ICD-10-CM | POA: Diagnosis not present

## 2016-04-03 DIAGNOSIS — G9589 Other specified diseases of spinal cord: Secondary | ICD-10-CM | POA: Diagnosis not present

## 2016-04-03 DIAGNOSIS — N400 Enlarged prostate without lower urinary tract symptoms: Secondary | ICD-10-CM | POA: Diagnosis not present

## 2016-04-03 DIAGNOSIS — E038 Other specified hypothyroidism: Secondary | ICD-10-CM | POA: Diagnosis not present

## 2016-04-11 DIAGNOSIS — Z981 Arthrodesis status: Secondary | ICD-10-CM | POA: Diagnosis not present

## 2016-04-11 DIAGNOSIS — M6281 Muscle weakness (generalized): Secondary | ICD-10-CM | POA: Diagnosis not present

## 2016-04-11 DIAGNOSIS — M4326 Fusion of spine, lumbar region: Secondary | ICD-10-CM | POA: Diagnosis not present

## 2016-04-11 DIAGNOSIS — R2689 Other abnormalities of gait and mobility: Secondary | ICD-10-CM | POA: Diagnosis not present

## 2016-04-11 DIAGNOSIS — G894 Chronic pain syndrome: Secondary | ICD-10-CM | POA: Diagnosis not present

## 2016-04-11 DIAGNOSIS — Z4789 Encounter for other orthopedic aftercare: Secondary | ICD-10-CM | POA: Diagnosis not present

## 2016-04-13 ENCOUNTER — Ambulatory Visit (INDEPENDENT_AMBULATORY_CARE_PROVIDER_SITE_OTHER): Payer: Medicare Other | Admitting: Family Medicine

## 2016-04-13 ENCOUNTER — Encounter: Payer: Self-pay | Admitting: Family Medicine

## 2016-04-13 VITALS — BP 139/88 | HR 78 | Temp 98.3°F | Ht 73.0 in | Wt 281.0 lb

## 2016-04-13 DIAGNOSIS — M4326 Fusion of spine, lumbar region: Secondary | ICD-10-CM | POA: Diagnosis not present

## 2016-04-13 DIAGNOSIS — G894 Chronic pain syndrome: Secondary | ICD-10-CM | POA: Diagnosis not present

## 2016-04-13 DIAGNOSIS — R600 Localized edema: Secondary | ICD-10-CM | POA: Diagnosis not present

## 2016-04-13 DIAGNOSIS — Z981 Arthrodesis status: Secondary | ICD-10-CM | POA: Diagnosis not present

## 2016-04-13 DIAGNOSIS — M6281 Muscle weakness (generalized): Secondary | ICD-10-CM | POA: Diagnosis not present

## 2016-04-13 DIAGNOSIS — R2689 Other abnormalities of gait and mobility: Secondary | ICD-10-CM | POA: Diagnosis not present

## 2016-04-13 DIAGNOSIS — Z4789 Encounter for other orthopedic aftercare: Secondary | ICD-10-CM | POA: Diagnosis not present

## 2016-04-13 MED ORDER — HYDROCHLOROTHIAZIDE 12.5 MG PO TABS: 12.5000 mg | ORAL_TABLET | Freq: Every day | ORAL | 0 refills | Status: DC

## 2016-04-13 NOTE — Progress Notes (Signed)
Subjective:    Patient ID: Kenneth Rhodes, male    DOB: 1945-07-25, 71 y.o.   MRN: [id number]  HPI 71 year old gentleman with swelling in lower extremities left worse than right. He had back surgery about 3 months ago. He spent 100 days and skilled nursing facility and is now in an assisted living. He is getting around with a walker and still has some therapy in the [place]. He denies any shortness of breath or any history of kidney problems.  Patient Active Problem List   Diagnosis Date Noted  . GAD (generalized anxiety disorder) 01/19/2015  . Lower abdominal pain 10/22/2014  . Diarrhea 10/22/2014  . Hepatitis C 10/17/2014  . Testosterone deficiency 06/09/2014  . Vitamin D deficiency 06/09/2014  . Opiate addiction ([place]) 03/26/2014  . Opiate withdrawal ([place]) 03/26/2014  . Depression, major, recurrent ([place]) 03/26/2014  . Suicidal ideation 03/26/2014  . Chronic pain 03/26/2014  . Lumbar radiculopathy, chronic 03/26/2014  . Hypothyroidism 03/26/2014  . BPH (benign prostatic hyperplasia) 03/26/2014  . Bladder spasm 03/26/2014  . Opioid dependence with opioid-induced mood disorder [facility])    Outpatient Encounter Prescriptions as of 04/13/2016  Medication Sig  . cloNIDine HCl (KAPVAY) 0.1 MG TB12 ER tablet Take 0.1 mg by mouth.  Marland Kitchen HYDROmorphone (DILAUDID) 2 MG tablet Take 1 tablet by mouth every 6 (six) hours as needed.  Marland Kitchen levothyroxine (SYNTHROID, LEVOTHROID) 125 MCG tablet take 1 tablet by mouth every morning BEFORE BREAKFAST  . linaclotide (LINZESS) 290 MCG CAPS capsule Take 290 mcg by mouth daily before breakfast.  . omeprazole (PRILOSEC) 40 MG capsule Take 40 mg by mouth 2 (two) times daily.  . Probiotic Product (PROBIOTIC FORMULA PO) Take 1 capsule by mouth daily.  . tamsulosin (FLOMAX) 0.4 MG CAPS capsule Take 0.4 mg by mouth every evening.   . [DISCONTINUED] dicyclomine (BENTYL) 10 MG capsule take 1 capsule by mouth four times a day before meals and at bedtime  . [DISCONTINUED]  ondansetron (ZOFRAN) 8 MG tablet Take 8 mg by mouth every 8 (eight) hours as needed for nausea or vomiting.   . [DISCONTINUED] SUBOXONE 12-3 MG FILM Take 1 Film by mouth daily.    Facility-Administered Encounter Medications as of 04/13/2016  Medication  . methylPREDNISolone acetate (DEPO-MEDROL) injection 80 mg      Review of Systems  Constitutional: Negative.   HENT: Negative.   Respiratory: Negative.   Cardiovascular: Negative.   Neurological: Negative.   Psychiatric/Behavioral: Negative.        Objective:   Physical Exam  Constitutional: He is oriented to person, place, and time. He appears well-developed and well-nourished.  Pulmonary/Chest: Effort normal and breath sounds normal.  Musculoskeletal: He exhibits edema (eft leg has 2-3+ edema right leg 1+. Bevelyn Buckles is negative. There is no increased temperature or erythema present.).  Neurological: He is alert and oriented to person, place, and time.   BP 139/88   Pulse 78   Temp 98.3 F (36.8 C) (Oral)   Ht '6\' 1"'$  (1.854 m)   Wt 281 lb (127.5 kg)   BMI 37.07 kg/m         Assessment & Plan:  1. Localized edema Unilateral edema raises suspicion of DVT. There are no external varicosities, however, this could be venous stasis disease. Will schedule Doppler to rule out DVT. In the meantime I have given him low dose hydrochlorothiazide ask him to keep his legs elevated when seated and use compression stockings. We will also check basic metabolic panel to look at  kidney function  Wardell Honour MD - BMP8+EGFR - US Venous Img Lower Bilateral; Future

## 2016-04-14 ENCOUNTER — Ambulatory Visit (HOSPITAL_COMMUNITY)
Admission: RE | Admit: 2016-04-14 | Discharge: 2016-04-14 | Disposition: A | Discharge status: Home / Self Care | Payer: Medicare Other | Source: Ambulatory Visit | Attending: Family Medicine | Admitting: Family Medicine

## 2016-04-14 ENCOUNTER — Telehealth: Payer: Self-pay | Admitting: Family Medicine

## 2016-04-14 DIAGNOSIS — R600 Localized edema: Secondary | ICD-10-CM | POA: Diagnosis not present

## 2016-04-14 LAB — BMP8+EGFR
BUN / CREAT RATIO: 10 (ref 10–24)
BUN: 11 mg/dL (ref 8–27)
CHLORIDE: 101 mmol/L (ref 96–106)
CO2: 23 mmol/L (ref 18–29)
Calcium: 9.4 mg/dL (ref 8.6–10.2)
Creatinine, Ser: 1.05 mg/dL (ref 0.76–1.27)
GFR calc non Af Amer: 73 mL/min/{1.73_m2} (ref >59)
GFR, EST AFRICAN AMERICAN: 84 mL/min/{1.73_m2} (ref >59)
GLUCOSE: 109 mg/dL — AB (ref 65–99)
POTASSIUM: 4.6 mmol/L (ref 3.5–5.2)
Sodium: 141 mmol/L (ref 134–144)

## 2016-04-14 NOTE — Progress Notes (Signed)
Patient aware.

## 2016-04-15 ENCOUNTER — Telehealth: Payer: Self-pay | Admitting: Family Medicine

## 2016-04-15 DIAGNOSIS — R2689 Other abnormalities of gait and mobility: Secondary | ICD-10-CM | POA: Diagnosis not present

## 2016-04-15 DIAGNOSIS — M6281 Muscle weakness (generalized): Secondary | ICD-10-CM | POA: Diagnosis not present

## 2016-04-15 DIAGNOSIS — Z4789 Encounter for other orthopedic aftercare: Secondary | ICD-10-CM | POA: Diagnosis not present

## 2016-04-15 DIAGNOSIS — G894 Chronic pain syndrome: Secondary | ICD-10-CM | POA: Diagnosis not present

## 2016-04-15 DIAGNOSIS — M4326 Fusion of spine, lumbar region: Secondary | ICD-10-CM | POA: Diagnosis not present

## 2016-04-15 DIAGNOSIS — Z981 Arthrodesis status: Secondary | ICD-10-CM | POA: Diagnosis not present

## 2016-04-15 NOTE — Telephone Encounter (Signed)
Faxed 02/23/17

## 2016-04-15 NOTE — Telephone Encounter (Signed)
Pt complaint of headache after taking HCTZ Also continued edema, no better Please advise

## 2016-04-17 DIAGNOSIS — G894 Chronic pain syndrome: Secondary | ICD-10-CM | POA: Diagnosis not present

## 2016-04-17 DIAGNOSIS — Z981 Arthrodesis status: Secondary | ICD-10-CM | POA: Diagnosis not present

## 2016-04-17 DIAGNOSIS — M4326 Fusion of spine, lumbar region: Secondary | ICD-10-CM | POA: Diagnosis not present

## 2016-04-17 DIAGNOSIS — R2689 Other abnormalities of gait and mobility: Secondary | ICD-10-CM | POA: Diagnosis not present

## 2016-04-17 DIAGNOSIS — M6281 Muscle weakness (generalized): Secondary | ICD-10-CM | POA: Diagnosis not present

## 2016-04-17 DIAGNOSIS — Z4789 Encounter for other orthopedic aftercare: Secondary | ICD-10-CM | POA: Diagnosis not present

## 2016-04-18 DIAGNOSIS — Z4789 Encounter for other orthopedic aftercare: Secondary | ICD-10-CM | POA: Diagnosis not present

## 2016-04-18 DIAGNOSIS — G894 Chronic pain syndrome: Secondary | ICD-10-CM | POA: Diagnosis not present

## 2016-04-18 DIAGNOSIS — Z981 Arthrodesis status: Secondary | ICD-10-CM | POA: Diagnosis not present

## 2016-04-18 DIAGNOSIS — M6281 Muscle weakness (generalized): Secondary | ICD-10-CM | POA: Diagnosis not present

## 2016-04-18 DIAGNOSIS — R2689 Other abnormalities of gait and mobility: Secondary | ICD-10-CM | POA: Diagnosis not present

## 2016-04-18 DIAGNOSIS — M4326 Fusion of spine, lumbar region: Secondary | ICD-10-CM | POA: Diagnosis not present

## 2016-04-19 DIAGNOSIS — Z4789 Encounter for other orthopedic aftercare: Secondary | ICD-10-CM | POA: Diagnosis not present

## 2016-04-19 DIAGNOSIS — M6281 Muscle weakness (generalized): Secondary | ICD-10-CM | POA: Diagnosis not present

## 2016-04-19 DIAGNOSIS — M4326 Fusion of spine, lumbar region: Secondary | ICD-10-CM | POA: Diagnosis not present

## 2016-04-19 DIAGNOSIS — Z981 Arthrodesis status: Secondary | ICD-10-CM | POA: Diagnosis not present

## 2016-04-19 DIAGNOSIS — R2689 Other abnormalities of gait and mobility: Secondary | ICD-10-CM | POA: Diagnosis not present

## 2016-04-19 DIAGNOSIS — G894 Chronic pain syndrome: Secondary | ICD-10-CM | POA: Diagnosis not present

## 2016-04-20 ENCOUNTER — Telehealth: Payer: Self-pay | Admitting: *Deleted

## 2016-04-20 NOTE — Telephone Encounter (Signed)
Kenneth Rhodes from [facility] called requesting an order to DC pt's Hctz Pt has not been taking due to medication causing headache Edema did improve however pt does not want to continue taking med Please advise

## 2016-04-21 MED ORDER — FUROSEMIDE 20 MG PO TABS: ORAL_TABLET | ORAL | 2 refills | Status: DC

## 2016-04-21 NOTE — Telephone Encounter (Signed)
Okay. Could try Lasix 20 mg qod

## 2016-04-21 NOTE — Telephone Encounter (Signed)
Patient aware and rx sent to pharmacy for furosemide and HCTZ was discontinued.

## 2016-04-21 NOTE — Telephone Encounter (Signed)
Okay to D/C HTCZw

## 2016-04-25 ENCOUNTER — Other Ambulatory Visit: Payer: Self-pay | Admitting: Family Medicine

## 2016-04-25 DIAGNOSIS — Z4789 Encounter for other orthopedic aftercare: Secondary | ICD-10-CM | POA: Diagnosis not present

## 2016-04-25 DIAGNOSIS — G894 Chronic pain syndrome: Secondary | ICD-10-CM | POA: Diagnosis not present

## 2016-04-25 DIAGNOSIS — M6281 Muscle weakness (generalized): Secondary | ICD-10-CM | POA: Diagnosis not present

## 2016-04-25 DIAGNOSIS — Z981 Arthrodesis status: Secondary | ICD-10-CM | POA: Diagnosis not present

## 2016-04-25 DIAGNOSIS — M4326 Fusion of spine, lumbar region: Secondary | ICD-10-CM | POA: Diagnosis not present

## 2016-04-25 DIAGNOSIS — R2689 Other abnormalities of gait and mobility: Secondary | ICD-10-CM | POA: Diagnosis not present

## 2016-04-27 DIAGNOSIS — R2689 Other abnormalities of gait and mobility: Secondary | ICD-10-CM | POA: Diagnosis not present

## 2016-04-27 DIAGNOSIS — Z981 Arthrodesis status: Secondary | ICD-10-CM | POA: Diagnosis not present

## 2016-04-27 DIAGNOSIS — E039 Hypothyroidism, unspecified: Secondary | ICD-10-CM | POA: Diagnosis not present

## 2016-04-27 DIAGNOSIS — K580 Irritable bowel syndrome with diarrhea: Secondary | ICD-10-CM | POA: Diagnosis not present

## 2016-04-27 DIAGNOSIS — R1084 Generalized abdominal pain: Secondary | ICD-10-CM | POA: Diagnosis not present

## 2016-04-27 DIAGNOSIS — G894 Chronic pain syndrome: Secondary | ICD-10-CM | POA: Diagnosis not present

## 2016-04-27 DIAGNOSIS — R600 Localized edema: Secondary | ICD-10-CM | POA: Diagnosis not present

## 2016-04-27 DIAGNOSIS — Z6838 Body mass index (BMI) 38.0-38.9, adult: Secondary | ICD-10-CM | POA: Diagnosis not present

## 2016-04-27 DIAGNOSIS — M6281 Muscle weakness (generalized): Secondary | ICD-10-CM | POA: Diagnosis not present

## 2016-04-27 DIAGNOSIS — M4326 Fusion of spine, lumbar region: Secondary | ICD-10-CM | POA: Diagnosis not present

## 2016-04-27 DIAGNOSIS — Z4789 Encounter for other orthopedic aftercare: Secondary | ICD-10-CM | POA: Diagnosis not present

## 2016-04-27 DIAGNOSIS — I10 Essential (primary) hypertension: Secondary | ICD-10-CM | POA: Diagnosis not present

## 2016-04-28 DIAGNOSIS — M4326 Fusion of spine, lumbar region: Secondary | ICD-10-CM | POA: Diagnosis not present

## 2016-04-28 DIAGNOSIS — G894 Chronic pain syndrome: Secondary | ICD-10-CM | POA: Diagnosis not present

## 2016-04-28 DIAGNOSIS — Z981 Arthrodesis status: Secondary | ICD-10-CM | POA: Diagnosis not present

## 2016-04-28 DIAGNOSIS — M6281 Muscle weakness (generalized): Secondary | ICD-10-CM | POA: Diagnosis not present

## 2016-04-28 DIAGNOSIS — R2689 Other abnormalities of gait and mobility: Secondary | ICD-10-CM | POA: Diagnosis not present

## 2016-04-28 DIAGNOSIS — Z4789 Encounter for other orthopedic aftercare: Secondary | ICD-10-CM | POA: Diagnosis not present

## 2016-05-02 DIAGNOSIS — G894 Chronic pain syndrome: Secondary | ICD-10-CM | POA: Diagnosis not present

## 2016-05-02 DIAGNOSIS — Z981 Arthrodesis status: Secondary | ICD-10-CM | POA: Diagnosis not present

## 2016-05-02 DIAGNOSIS — R2689 Other abnormalities of gait and mobility: Secondary | ICD-10-CM | POA: Diagnosis not present

## 2016-05-02 DIAGNOSIS — Z4789 Encounter for other orthopedic aftercare: Secondary | ICD-10-CM | POA: Diagnosis not present

## 2016-05-02 DIAGNOSIS — M4326 Fusion of spine, lumbar region: Secondary | ICD-10-CM | POA: Diagnosis not present

## 2016-05-02 DIAGNOSIS — M6281 Muscle weakness (generalized): Secondary | ICD-10-CM | POA: Diagnosis not present

## 2016-05-03 DIAGNOSIS — Z981 Arthrodesis status: Secondary | ICD-10-CM | POA: Diagnosis not present

## 2016-05-03 DIAGNOSIS — G894 Chronic pain syndrome: Secondary | ICD-10-CM | POA: Diagnosis not present

## 2016-05-03 DIAGNOSIS — R2689 Other abnormalities of gait and mobility: Secondary | ICD-10-CM | POA: Diagnosis not present

## 2016-05-03 DIAGNOSIS — Z4789 Encounter for other orthopedic aftercare: Secondary | ICD-10-CM | POA: Diagnosis not present

## 2016-05-03 DIAGNOSIS — M4326 Fusion of spine, lumbar region: Secondary | ICD-10-CM | POA: Diagnosis not present

## 2016-05-03 DIAGNOSIS — M6281 Muscle weakness (generalized): Secondary | ICD-10-CM | POA: Diagnosis not present

## 2016-05-04 DIAGNOSIS — R2689 Other abnormalities of gait and mobility: Secondary | ICD-10-CM | POA: Diagnosis not present

## 2016-05-04 DIAGNOSIS — M4326 Fusion of spine, lumbar region: Secondary | ICD-10-CM | POA: Diagnosis not present

## 2016-05-04 DIAGNOSIS — M6281 Muscle weakness (generalized): Secondary | ICD-10-CM | POA: Diagnosis not present

## 2016-05-04 DIAGNOSIS — Z4789 Encounter for other orthopedic aftercare: Secondary | ICD-10-CM | POA: Diagnosis not present

## 2016-05-04 DIAGNOSIS — G894 Chronic pain syndrome: Secondary | ICD-10-CM | POA: Diagnosis not present

## 2016-05-04 DIAGNOSIS — Z981 Arthrodesis status: Secondary | ICD-10-CM | POA: Diagnosis not present

## 2016-05-05 DIAGNOSIS — G4700 Insomnia, unspecified: Secondary | ICD-10-CM | POA: Diagnosis not present

## 2016-05-06 DIAGNOSIS — M5125 Other intervertebral disc displacement, thoracolumbar region: Secondary | ICD-10-CM | POA: Diagnosis not present

## 2016-05-09 DIAGNOSIS — G894 Chronic pain syndrome: Secondary | ICD-10-CM | POA: Diagnosis not present

## 2016-05-09 DIAGNOSIS — Z4789 Encounter for other orthopedic aftercare: Secondary | ICD-10-CM | POA: Diagnosis not present

## 2016-05-09 DIAGNOSIS — M6281 Muscle weakness (generalized): Secondary | ICD-10-CM | POA: Diagnosis not present

## 2016-05-09 DIAGNOSIS — M4326 Fusion of spine, lumbar region: Secondary | ICD-10-CM | POA: Diagnosis not present

## 2016-05-09 DIAGNOSIS — Z981 Arthrodesis status: Secondary | ICD-10-CM | POA: Diagnosis not present

## 2016-05-09 DIAGNOSIS — R2689 Other abnormalities of gait and mobility: Secondary | ICD-10-CM | POA: Diagnosis not present

## 2016-05-11 DIAGNOSIS — R600 Localized edema: Secondary | ICD-10-CM | POA: Diagnosis not present

## 2016-05-11 DIAGNOSIS — M6281 Muscle weakness (generalized): Secondary | ICD-10-CM | POA: Diagnosis not present

## 2016-05-11 DIAGNOSIS — R2689 Other abnormalities of gait and mobility: Secondary | ICD-10-CM | POA: Diagnosis not present

## 2016-05-11 DIAGNOSIS — E039 Hypothyroidism, unspecified: Secondary | ICD-10-CM | POA: Diagnosis not present

## 2016-05-11 DIAGNOSIS — L821 Other seborrheic keratosis: Secondary | ICD-10-CM | POA: Diagnosis not present

## 2016-05-11 DIAGNOSIS — L918 Other hypertrophic disorders of the skin: Secondary | ICD-10-CM | POA: Diagnosis not present

## 2016-05-11 DIAGNOSIS — Z981 Arthrodesis status: Secondary | ICD-10-CM | POA: Diagnosis not present

## 2016-05-11 DIAGNOSIS — I10 Essential (primary) hypertension: Secondary | ICD-10-CM | POA: Diagnosis not present

## 2016-05-11 DIAGNOSIS — Z6838 Body mass index (BMI) 38.0-38.9, adult: Secondary | ICD-10-CM | POA: Diagnosis not present

## 2016-05-11 DIAGNOSIS — M4326 Fusion of spine, lumbar region: Secondary | ICD-10-CM | POA: Diagnosis not present

## 2016-05-11 DIAGNOSIS — G894 Chronic pain syndrome: Secondary | ICD-10-CM | POA: Diagnosis not present

## 2016-05-11 DIAGNOSIS — Z4789 Encounter for other orthopedic aftercare: Secondary | ICD-10-CM | POA: Diagnosis not present

## 2016-05-13 DIAGNOSIS — Z981 Arthrodesis status: Secondary | ICD-10-CM | POA: Diagnosis not present

## 2016-05-13 DIAGNOSIS — R600 Localized edema: Secondary | ICD-10-CM | POA: Diagnosis not present

## 2016-05-13 DIAGNOSIS — R2689 Other abnormalities of gait and mobility: Secondary | ICD-10-CM | POA: Diagnosis not present

## 2016-05-13 DIAGNOSIS — M6281 Muscle weakness (generalized): Secondary | ICD-10-CM | POA: Diagnosis not present

## 2016-05-13 DIAGNOSIS — G894 Chronic pain syndrome: Secondary | ICD-10-CM | POA: Diagnosis not present

## 2016-05-13 DIAGNOSIS — Z4789 Encounter for other orthopedic aftercare: Secondary | ICD-10-CM | POA: Diagnosis not present

## 2016-05-13 DIAGNOSIS — M4326 Fusion of spine, lumbar region: Secondary | ICD-10-CM | POA: Diagnosis not present

## 2016-05-16 DIAGNOSIS — G894 Chronic pain syndrome: Secondary | ICD-10-CM | POA: Diagnosis not present

## 2016-05-16 DIAGNOSIS — M6281 Muscle weakness (generalized): Secondary | ICD-10-CM | POA: Diagnosis not present

## 2016-05-16 DIAGNOSIS — Z981 Arthrodesis status: Secondary | ICD-10-CM | POA: Diagnosis not present

## 2016-05-16 DIAGNOSIS — M4326 Fusion of spine, lumbar region: Secondary | ICD-10-CM | POA: Diagnosis not present

## 2016-05-16 DIAGNOSIS — R2689 Other abnormalities of gait and mobility: Secondary | ICD-10-CM | POA: Diagnosis not present

## 2016-05-16 DIAGNOSIS — Z4789 Encounter for other orthopedic aftercare: Secondary | ICD-10-CM | POA: Diagnosis not present

## 2016-05-18 DIAGNOSIS — Z981 Arthrodesis status: Secondary | ICD-10-CM | POA: Diagnosis not present

## 2016-05-18 DIAGNOSIS — G894 Chronic pain syndrome: Secondary | ICD-10-CM | POA: Diagnosis not present

## 2016-05-18 DIAGNOSIS — M4326 Fusion of spine, lumbar region: Secondary | ICD-10-CM | POA: Diagnosis not present

## 2016-05-18 DIAGNOSIS — Z4789 Encounter for other orthopedic aftercare: Secondary | ICD-10-CM | POA: Diagnosis not present

## 2016-05-18 DIAGNOSIS — M6281 Muscle weakness (generalized): Secondary | ICD-10-CM | POA: Diagnosis not present

## 2016-05-18 DIAGNOSIS — R2689 Other abnormalities of gait and mobility: Secondary | ICD-10-CM | POA: Diagnosis not present

## 2016-05-19 DIAGNOSIS — G4700 Insomnia, unspecified: Secondary | ICD-10-CM | POA: Diagnosis not present

## 2016-05-19 DIAGNOSIS — G894 Chronic pain syndrome: Secondary | ICD-10-CM | POA: Diagnosis not present

## 2016-05-19 DIAGNOSIS — Z981 Arthrodesis status: Secondary | ICD-10-CM | POA: Diagnosis not present

## 2016-05-19 DIAGNOSIS — M4326 Fusion of spine, lumbar region: Secondary | ICD-10-CM | POA: Diagnosis not present

## 2016-05-19 DIAGNOSIS — Z4789 Encounter for other orthopedic aftercare: Secondary | ICD-10-CM | POA: Diagnosis not present

## 2016-05-19 DIAGNOSIS — R2689 Other abnormalities of gait and mobility: Secondary | ICD-10-CM | POA: Diagnosis not present

## 2016-05-19 DIAGNOSIS — M6281 Muscle weakness (generalized): Secondary | ICD-10-CM | POA: Diagnosis not present

## 2016-05-24 DIAGNOSIS — M4326 Fusion of spine, lumbar region: Secondary | ICD-10-CM | POA: Diagnosis not present

## 2016-05-24 DIAGNOSIS — R2689 Other abnormalities of gait and mobility: Secondary | ICD-10-CM | POA: Diagnosis not present

## 2016-05-24 DIAGNOSIS — Z4789 Encounter for other orthopedic aftercare: Secondary | ICD-10-CM | POA: Diagnosis not present

## 2016-05-24 DIAGNOSIS — M6281 Muscle weakness (generalized): Secondary | ICD-10-CM | POA: Diagnosis not present

## 2016-05-24 DIAGNOSIS — G894 Chronic pain syndrome: Secondary | ICD-10-CM | POA: Diagnosis not present

## 2016-05-24 DIAGNOSIS — Z981 Arthrodesis status: Secondary | ICD-10-CM | POA: Diagnosis not present

## 2016-05-25 DIAGNOSIS — K219 Gastro-esophageal reflux disease without esophagitis: Secondary | ICD-10-CM | POA: Diagnosis not present

## 2016-05-25 DIAGNOSIS — R600 Localized edema: Secondary | ICD-10-CM | POA: Diagnosis not present

## 2016-05-25 DIAGNOSIS — L209 Atopic dermatitis, unspecified: Secondary | ICD-10-CM | POA: Diagnosis not present

## 2016-05-25 DIAGNOSIS — I10 Essential (primary) hypertension: Secondary | ICD-10-CM | POA: Diagnosis not present

## 2016-05-25 DIAGNOSIS — E039 Hypothyroidism, unspecified: Secondary | ICD-10-CM | POA: Diagnosis not present

## 2016-05-25 DIAGNOSIS — Z6838 Body mass index (BMI) 38.0-38.9, adult: Secondary | ICD-10-CM | POA: Diagnosis not present

## 2016-05-26 DIAGNOSIS — Z981 Arthrodesis status: Secondary | ICD-10-CM | POA: Diagnosis not present

## 2016-05-26 DIAGNOSIS — R2689 Other abnormalities of gait and mobility: Secondary | ICD-10-CM | POA: Diagnosis not present

## 2016-05-26 DIAGNOSIS — M6281 Muscle weakness (generalized): Secondary | ICD-10-CM | POA: Diagnosis not present

## 2016-05-26 DIAGNOSIS — G894 Chronic pain syndrome: Secondary | ICD-10-CM | POA: Diagnosis not present

## 2016-05-26 DIAGNOSIS — M4326 Fusion of spine, lumbar region: Secondary | ICD-10-CM | POA: Diagnosis not present

## 2016-05-26 DIAGNOSIS — Z4789 Encounter for other orthopedic aftercare: Secondary | ICD-10-CM | POA: Diagnosis not present

## 2016-05-29 DIAGNOSIS — M6281 Muscle weakness (generalized): Secondary | ICD-10-CM | POA: Diagnosis not present

## 2016-05-29 DIAGNOSIS — G894 Chronic pain syndrome: Secondary | ICD-10-CM | POA: Diagnosis not present

## 2016-05-29 DIAGNOSIS — Z4789 Encounter for other orthopedic aftercare: Secondary | ICD-10-CM | POA: Diagnosis not present

## 2016-05-29 DIAGNOSIS — M4326 Fusion of spine, lumbar region: Secondary | ICD-10-CM | POA: Diagnosis not present

## 2016-05-29 DIAGNOSIS — R2689 Other abnormalities of gait and mobility: Secondary | ICD-10-CM | POA: Diagnosis not present

## 2016-05-29 DIAGNOSIS — Z981 Arthrodesis status: Secondary | ICD-10-CM | POA: Diagnosis not present

## 2016-06-02 DIAGNOSIS — Z4789 Encounter for other orthopedic aftercare: Secondary | ICD-10-CM | POA: Diagnosis not present

## 2016-06-02 DIAGNOSIS — Z981 Arthrodesis status: Secondary | ICD-10-CM | POA: Diagnosis not present

## 2016-06-02 DIAGNOSIS — R2689 Other abnormalities of gait and mobility: Secondary | ICD-10-CM | POA: Diagnosis not present

## 2016-06-02 DIAGNOSIS — M4326 Fusion of spine, lumbar region: Secondary | ICD-10-CM | POA: Diagnosis not present

## 2016-06-02 DIAGNOSIS — G894 Chronic pain syndrome: Secondary | ICD-10-CM | POA: Diagnosis not present

## 2016-06-02 DIAGNOSIS — M6281 Muscle weakness (generalized): Secondary | ICD-10-CM | POA: Diagnosis not present

## 2016-06-09 DIAGNOSIS — M4326 Fusion of spine, lumbar region: Secondary | ICD-10-CM | POA: Diagnosis not present

## 2016-06-09 DIAGNOSIS — Z981 Arthrodesis status: Secondary | ICD-10-CM | POA: Diagnosis not present

## 2016-06-09 DIAGNOSIS — Z4789 Encounter for other orthopedic aftercare: Secondary | ICD-10-CM | POA: Diagnosis not present

## 2016-06-09 DIAGNOSIS — M6281 Muscle weakness (generalized): Secondary | ICD-10-CM | POA: Diagnosis not present

## 2016-06-09 DIAGNOSIS — G894 Chronic pain syndrome: Secondary | ICD-10-CM | POA: Diagnosis not present

## 2016-06-09 DIAGNOSIS — R2689 Other abnormalities of gait and mobility: Secondary | ICD-10-CM | POA: Diagnosis not present

## 2016-06-10 DIAGNOSIS — G894 Chronic pain syndrome: Secondary | ICD-10-CM | POA: Diagnosis not present

## 2016-06-10 DIAGNOSIS — Z981 Arthrodesis status: Secondary | ICD-10-CM | POA: Diagnosis not present

## 2016-06-10 DIAGNOSIS — N400 Enlarged prostate without lower urinary tract symptoms: Secondary | ICD-10-CM | POA: Diagnosis not present

## 2016-06-10 DIAGNOSIS — I10 Essential (primary) hypertension: Secondary | ICD-10-CM | POA: Diagnosis not present

## 2016-06-10 DIAGNOSIS — M6281 Muscle weakness (generalized): Secondary | ICD-10-CM | POA: Diagnosis not present

## 2016-06-10 DIAGNOSIS — Z4789 Encounter for other orthopedic aftercare: Secondary | ICD-10-CM | POA: Diagnosis not present

## 2016-06-14 DIAGNOSIS — N400 Enlarged prostate without lower urinary tract symptoms: Secondary | ICD-10-CM | POA: Diagnosis not present

## 2016-06-14 DIAGNOSIS — G894 Chronic pain syndrome: Secondary | ICD-10-CM | POA: Diagnosis not present

## 2016-06-14 DIAGNOSIS — I10 Essential (primary) hypertension: Secondary | ICD-10-CM | POA: Diagnosis not present

## 2016-06-14 DIAGNOSIS — M6281 Muscle weakness (generalized): Secondary | ICD-10-CM | POA: Diagnosis not present

## 2016-06-14 DIAGNOSIS — Z981 Arthrodesis status: Secondary | ICD-10-CM | POA: Diagnosis not present

## 2016-06-14 DIAGNOSIS — Z4789 Encounter for other orthopedic aftercare: Secondary | ICD-10-CM | POA: Diagnosis not present

## 2016-06-16 DIAGNOSIS — Z4789 Encounter for other orthopedic aftercare: Secondary | ICD-10-CM | POA: Diagnosis not present

## 2016-06-16 DIAGNOSIS — G894 Chronic pain syndrome: Secondary | ICD-10-CM | POA: Diagnosis not present

## 2016-06-16 DIAGNOSIS — M6281 Muscle weakness (generalized): Secondary | ICD-10-CM | POA: Diagnosis not present

## 2016-06-16 DIAGNOSIS — Z981 Arthrodesis status: Secondary | ICD-10-CM | POA: Diagnosis not present

## 2016-06-16 DIAGNOSIS — N400 Enlarged prostate without lower urinary tract symptoms: Secondary | ICD-10-CM | POA: Diagnosis not present

## 2016-06-16 DIAGNOSIS — I10 Essential (primary) hypertension: Secondary | ICD-10-CM | POA: Diagnosis not present

## 2016-06-16 DIAGNOSIS — G4700 Insomnia, unspecified: Secondary | ICD-10-CM | POA: Diagnosis not present

## 2016-08-04 DIAGNOSIS — R1084 Generalized abdominal pain: Secondary | ICD-10-CM | POA: Diagnosis not present

## 2016-08-04 DIAGNOSIS — Z6837 Body mass index (BMI) 37.0-37.9, adult: Secondary | ICD-10-CM | POA: Diagnosis not present

## 2016-08-04 DIAGNOSIS — E039 Hypothyroidism, unspecified: Secondary | ICD-10-CM | POA: Diagnosis not present

## 2016-08-04 DIAGNOSIS — K219 Gastro-esophageal reflux disease without esophagitis: Secondary | ICD-10-CM | POA: Diagnosis not present

## 2016-08-04 DIAGNOSIS — G894 Chronic pain syndrome: Secondary | ICD-10-CM | POA: Diagnosis not present

## 2016-08-04 DIAGNOSIS — F329 Major depressive disorder, single episode, unspecified: Secondary | ICD-10-CM | POA: Diagnosis not present

## 2016-08-04 DIAGNOSIS — D519 Vitamin B12 deficiency anemia, unspecified: Secondary | ICD-10-CM | POA: Diagnosis not present

## 2016-08-09 DIAGNOSIS — M5125 Other intervertebral disc displacement, thoracolumbar region: Secondary | ICD-10-CM | POA: Diagnosis not present

## 2016-08-19 DIAGNOSIS — Z8614 Personal history of Methicillin resistant Staphylococcus aureus infection: Secondary | ICD-10-CM | POA: Diagnosis not present

## 2016-08-19 DIAGNOSIS — R1032 Left lower quadrant pain: Secondary | ICD-10-CM | POA: Diagnosis not present

## 2016-08-19 DIAGNOSIS — Z8619 Personal history of other infectious and parasitic diseases: Secondary | ICD-10-CM | POA: Diagnosis not present

## 2016-08-19 DIAGNOSIS — F329 Major depressive disorder, single episode, unspecified: Secondary | ICD-10-CM | POA: Diagnosis not present

## 2016-08-19 DIAGNOSIS — E039 Hypothyroidism, unspecified: Secondary | ICD-10-CM | POA: Diagnosis not present

## 2016-08-19 DIAGNOSIS — Z79899 Other long term (current) drug therapy: Secondary | ICD-10-CM | POA: Diagnosis not present

## 2016-08-19 DIAGNOSIS — R1084 Generalized abdominal pain: Secondary | ICD-10-CM | POA: Diagnosis not present

## 2016-08-19 DIAGNOSIS — M5489 Other dorsalgia: Secondary | ICD-10-CM | POA: Diagnosis not present

## 2016-08-19 DIAGNOSIS — K2970 Gastritis, unspecified, without bleeding: Secondary | ICD-10-CM | POA: Diagnosis not present

## 2016-08-19 DIAGNOSIS — R1111 Vomiting without nausea: Secondary | ICD-10-CM | POA: Diagnosis not present

## 2016-08-24 DIAGNOSIS — Z6837 Body mass index (BMI) 37.0-37.9, adult: Secondary | ICD-10-CM | POA: Diagnosis not present

## 2016-08-24 DIAGNOSIS — K5901 Slow transit constipation: Secondary | ICD-10-CM | POA: Diagnosis not present

## 2016-08-24 DIAGNOSIS — G894 Chronic pain syndrome: Secondary | ICD-10-CM | POA: Diagnosis not present

## 2016-08-24 DIAGNOSIS — F329 Major depressive disorder, single episode, unspecified: Secondary | ICD-10-CM | POA: Diagnosis not present

## 2016-08-24 DIAGNOSIS — R1084 Generalized abdominal pain: Secondary | ICD-10-CM | POA: Diagnosis not present

## 2016-08-24 DIAGNOSIS — K219 Gastro-esophageal reflux disease without esophagitis: Secondary | ICD-10-CM | POA: Diagnosis not present

## 2016-11-15 DIAGNOSIS — M4716 Other spondylosis with myelopathy, lumbar region: Secondary | ICD-10-CM | POA: Diagnosis not present

## 2016-11-25 IMAGING — CR DG ABDOMEN 1V
1 series · 1 of 1 positions shown · non-contrast
Comparison: None.

CLINICAL DATA: Left upper quadrant abdominal pain.

EXAM:
ABDOMEN - 1 VIEW

[view not recorded]
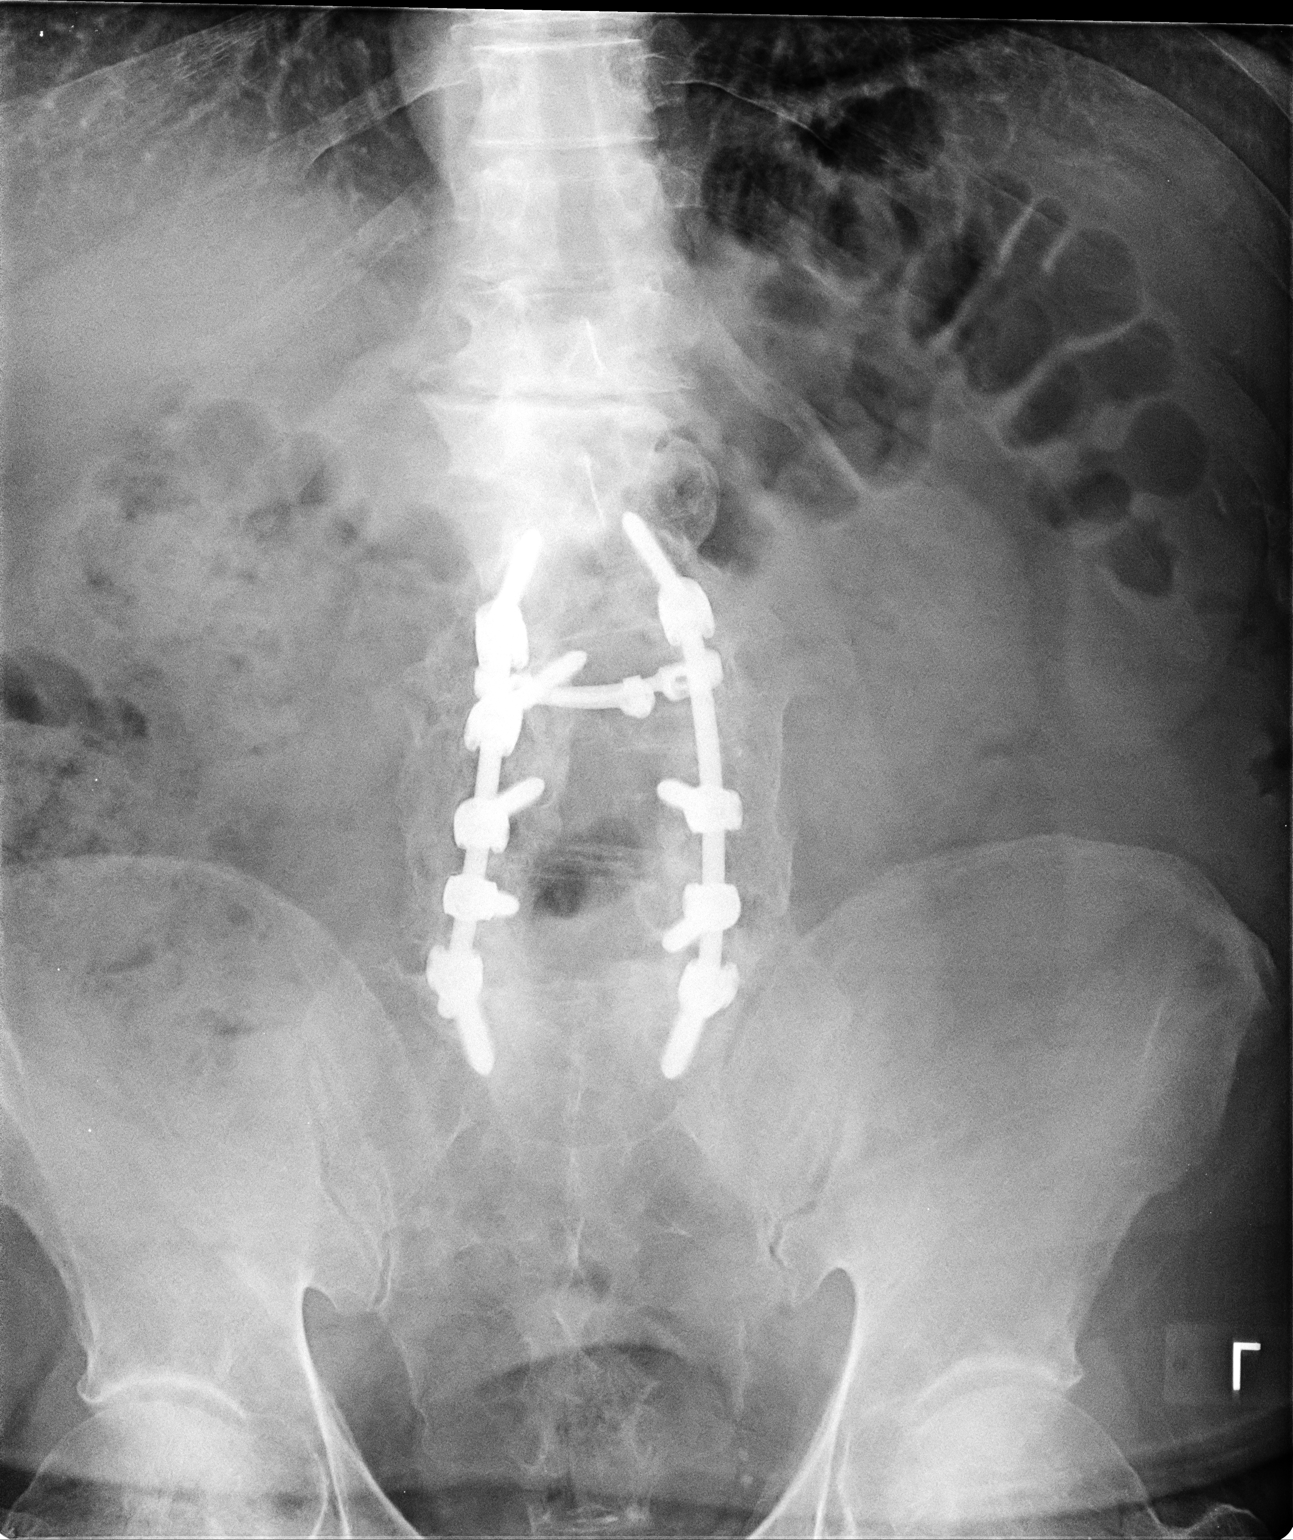

[1 of 1 positions shown; findings below may reference images not displayed]

FINDINGS: The bowel gas pattern is normal. Phleboliths are noted in the
pelvis. Status post surgical fusion of lumbar spine.
IMPRESSION: No evidence of bowel obstruction or ileus.

## 2017-01-18 DIAGNOSIS — G894 Chronic pain syndrome: Secondary | ICD-10-CM | POA: Diagnosis not present

## 2017-01-18 DIAGNOSIS — D649 Anemia, unspecified: Secondary | ICD-10-CM | POA: Diagnosis not present

## 2017-01-18 DIAGNOSIS — R1084 Generalized abdominal pain: Secondary | ICD-10-CM | POA: Diagnosis not present

## 2017-01-18 DIAGNOSIS — Z6838 Body mass index (BMI) 38.0-38.9, adult: Secondary | ICD-10-CM | POA: Diagnosis not present

## 2017-01-18 DIAGNOSIS — E039 Hypothyroidism, unspecified: Secondary | ICD-10-CM | POA: Diagnosis not present

## 2017-01-18 DIAGNOSIS — D519 Vitamin B12 deficiency anemia, unspecified: Secondary | ICD-10-CM | POA: Diagnosis not present

## 2017-01-18 DIAGNOSIS — I10 Essential (primary) hypertension: Secondary | ICD-10-CM | POA: Diagnosis not present

## 2017-01-18 DIAGNOSIS — D529 Folate deficiency anemia, unspecified: Secondary | ICD-10-CM | POA: Diagnosis not present

## 2017-01-18 DIAGNOSIS — R600 Localized edema: Secondary | ICD-10-CM | POA: Diagnosis not present

## 2017-01-18 DIAGNOSIS — R32 Unspecified urinary incontinence: Secondary | ICD-10-CM | POA: Diagnosis not present

## 2017-02-10 IMAGING — CR DG ABDOMEN 2V
2 series · 2 of 2 positions shown · non-contrast
Comparison: 06/26/2014

CLINICAL DATA: Diarrhea 2 weeks ago, recent C difficile infection
with antibiotic treatment, history hypertension, chronic hepatitis-C
and liver disease, hyperlipidemia, GERD

EXAM:
ABDOMEN - 2 VIEW

[view not recorded (1 of 2)]
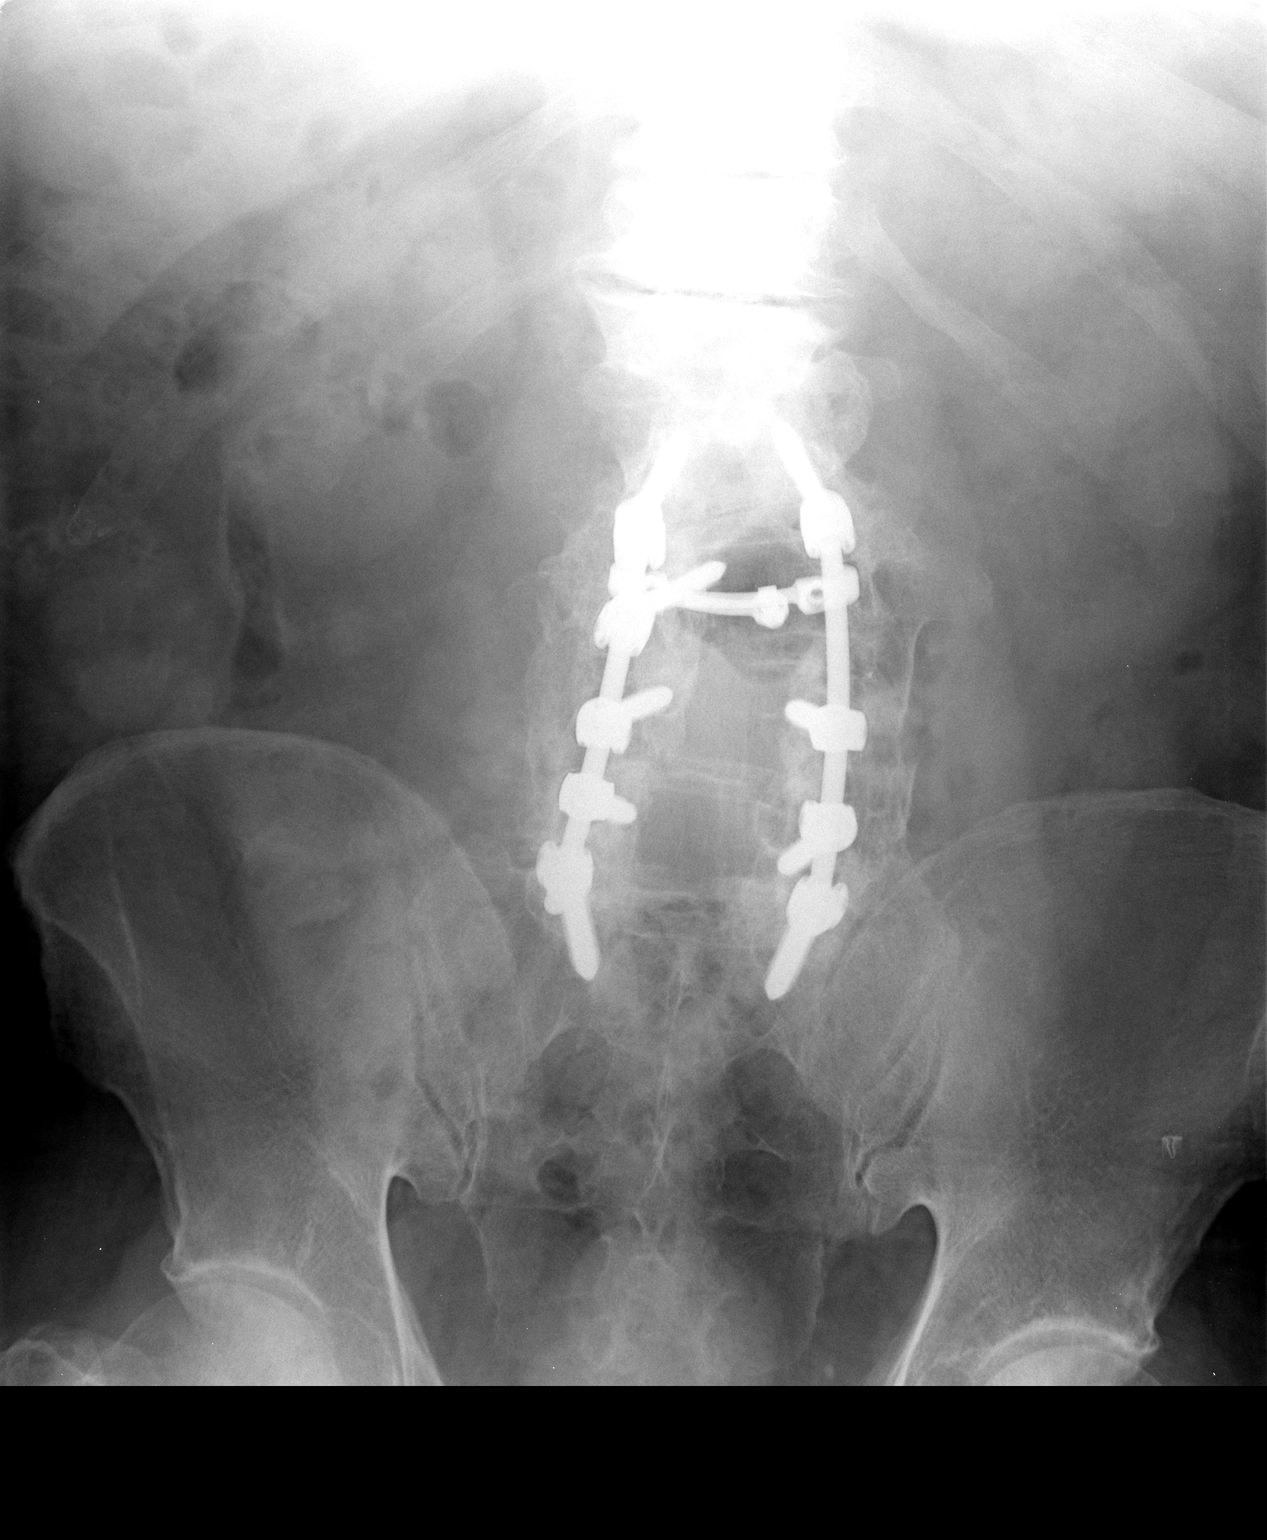

[view not recorded (2 of 2)]
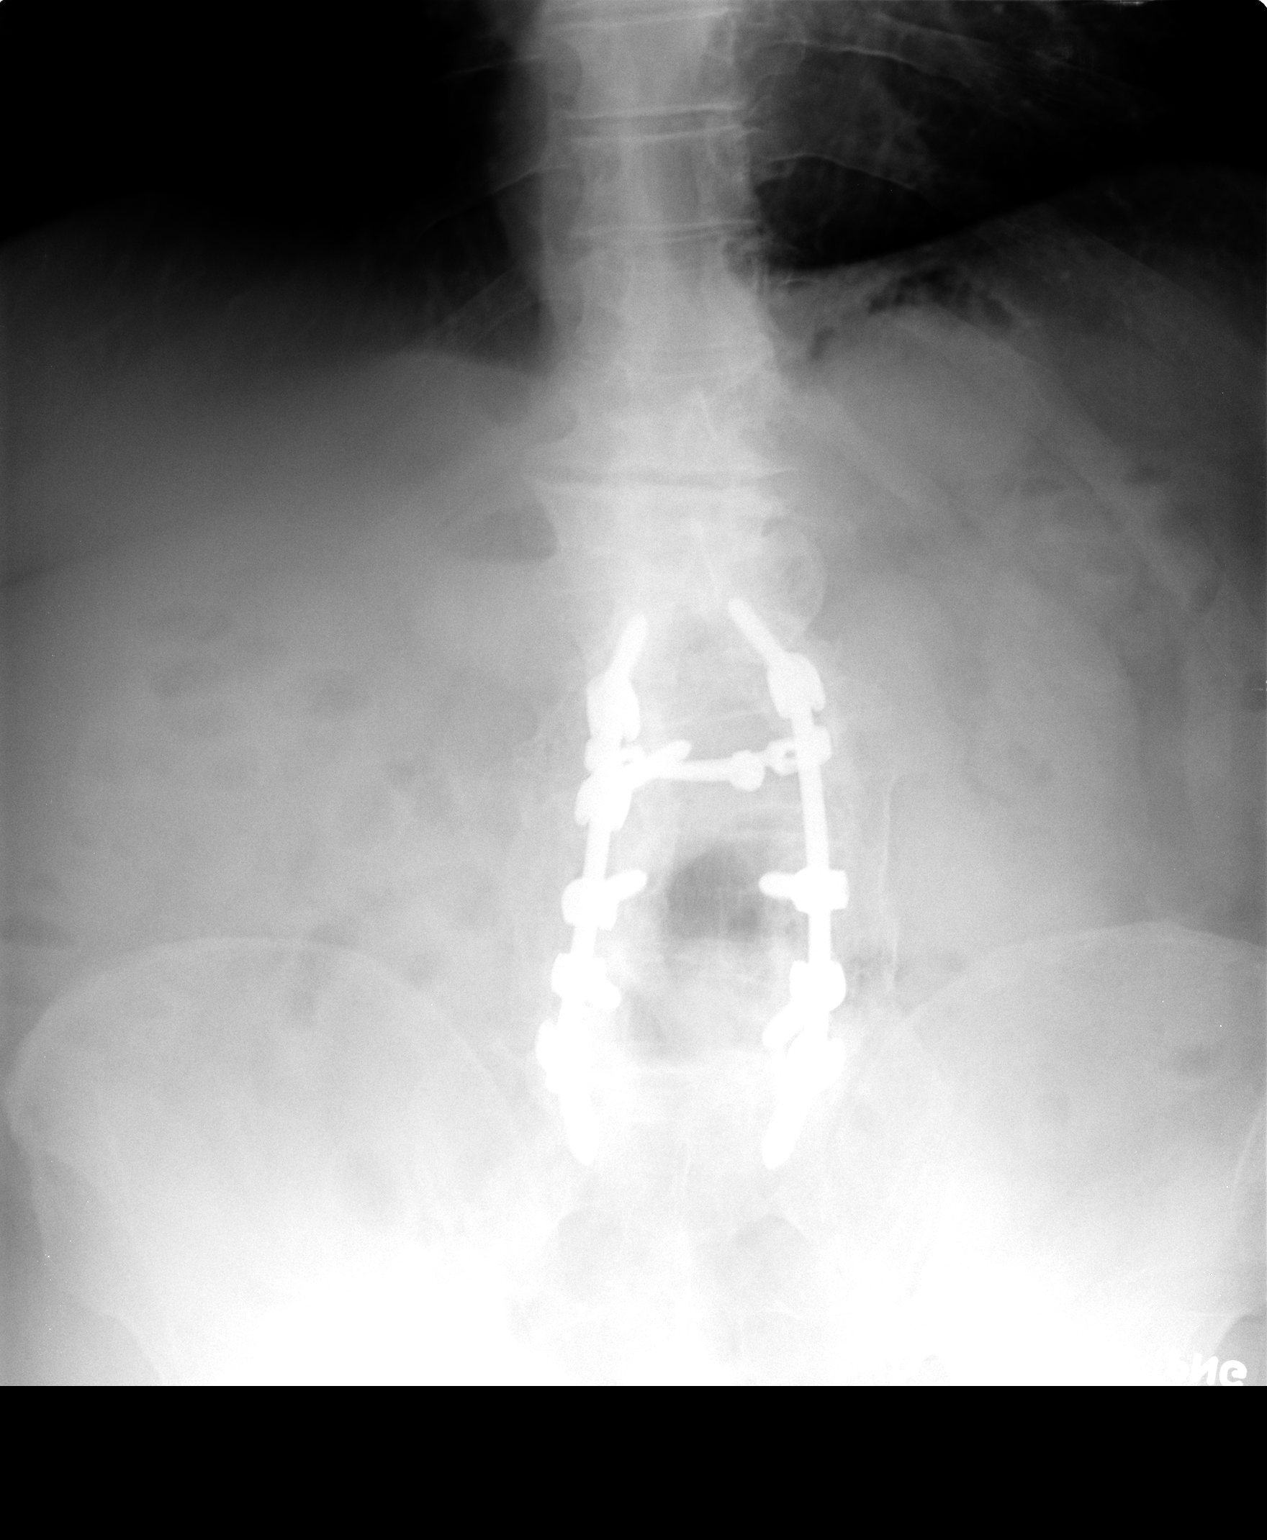

[2 of 2 positions shown; findings below may reference images not displayed]

FINDINGS: Normal bowel gas pattern.

No bowel dilatation or bowel wall thickening.

Prior lumbar fusion L2-S1 with prior laminectomies of L3, L4 and L5.

Multilevel degenerative disc disease changes of the lower thoracic
and upper lumbar spine.

No definite urinary tract calcification.
IMPRESSION: Normal bowel gas pattern.

## 2017-02-15 DIAGNOSIS — M5125 Other intervertebral disc displacement, thoracolumbar region: Secondary | ICD-10-CM | POA: Diagnosis not present

## 2017-04-20 IMAGING — US US ABDOMEN COMPLETE
1 series · 14 of 25 positions shown · non-contrast
Comparison: CT abdomen pelvis dated 02/26/2014

CLINICAL DATA: Elevated LFTs, generalized abdominal pain

EXAM:
ULTRASOUND ABDOMEN COMPLETE

[Series 1: us abdomen complete · 0.21mm/px · 14 of 89 slices shown]
[im 1/89]
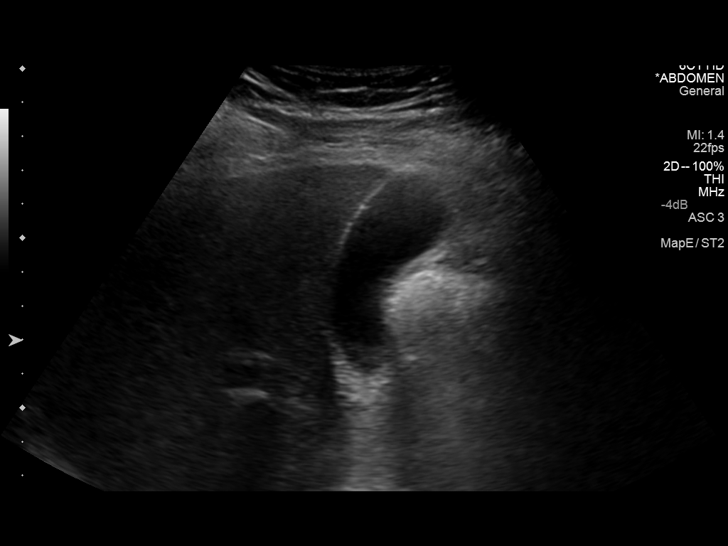
[im 8/89]
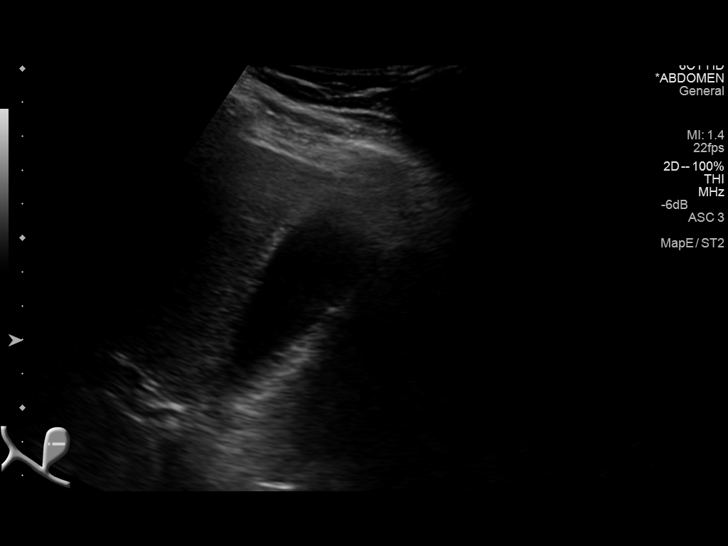
[im 15/89]
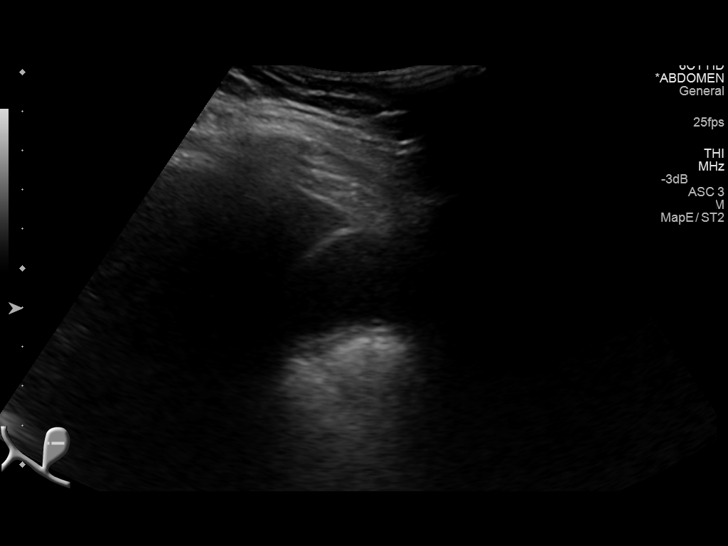
[im 23/89]
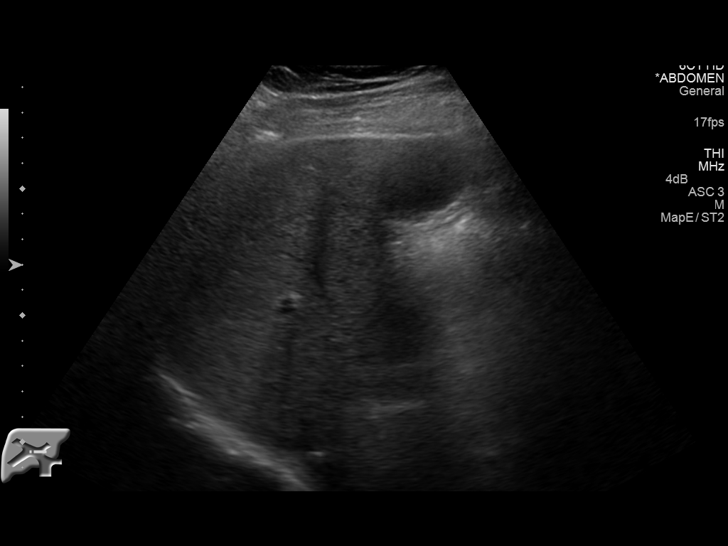
[im 30/89]
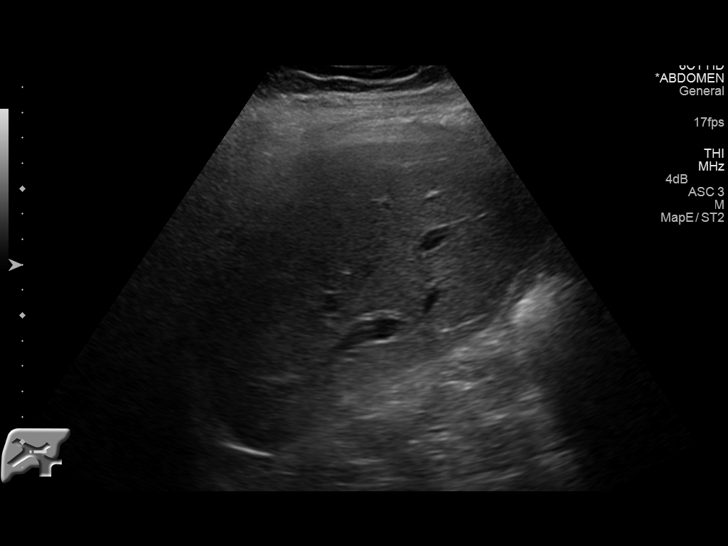
[im 34/89]
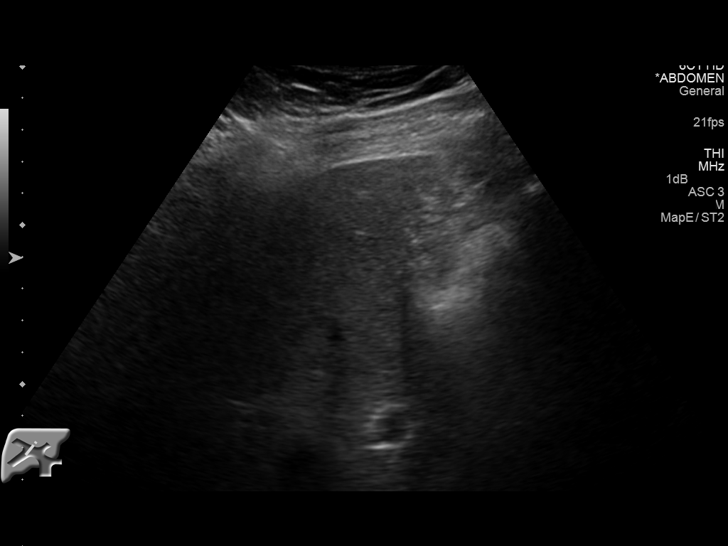
[im 41/89]
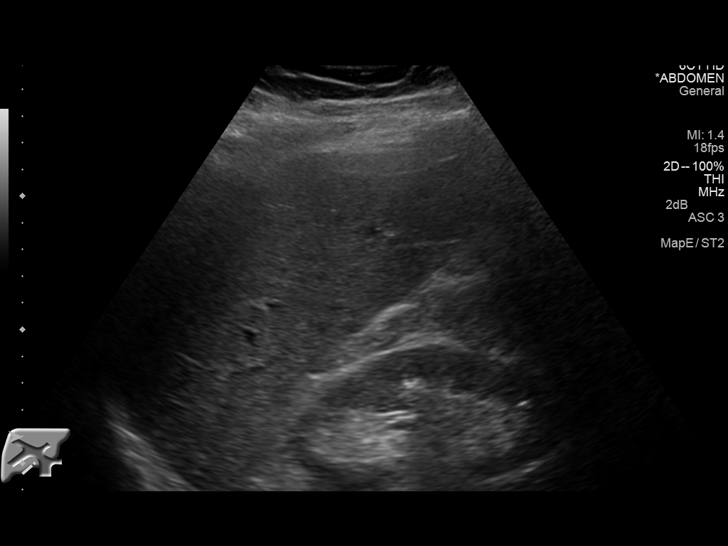
[im 48/89]
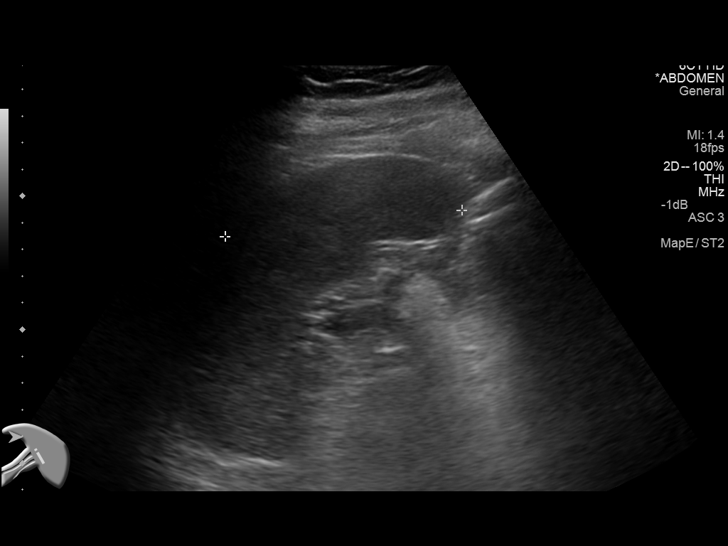
[im 56/89]
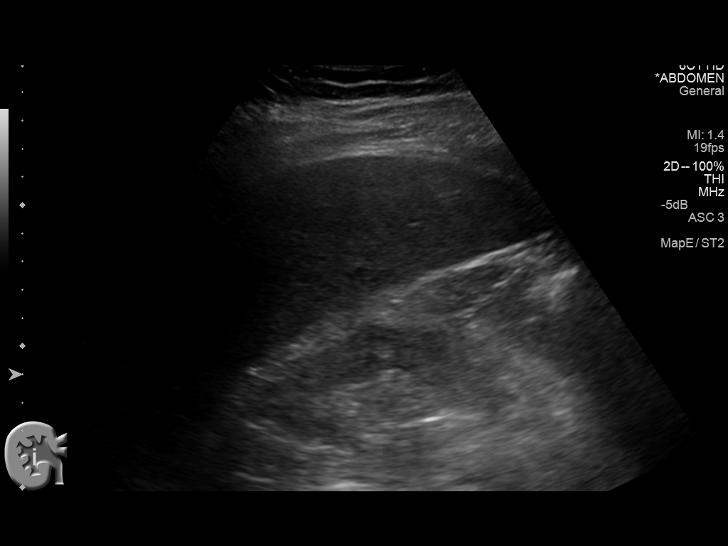
[im 59/89]
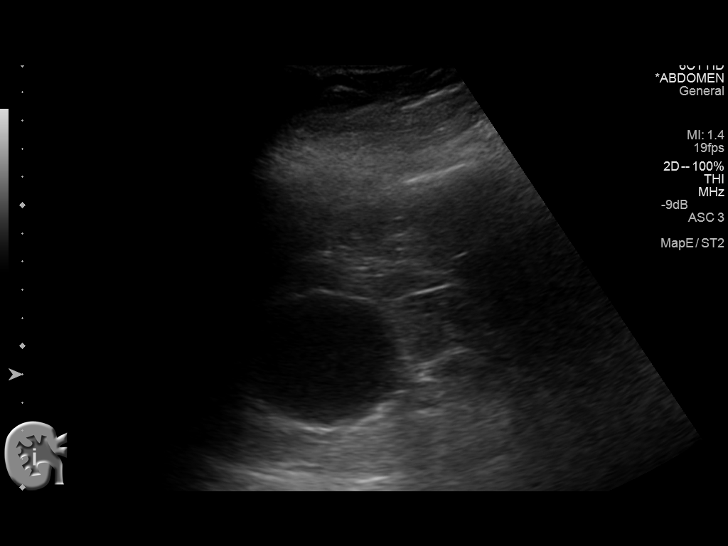
[im 67/89]
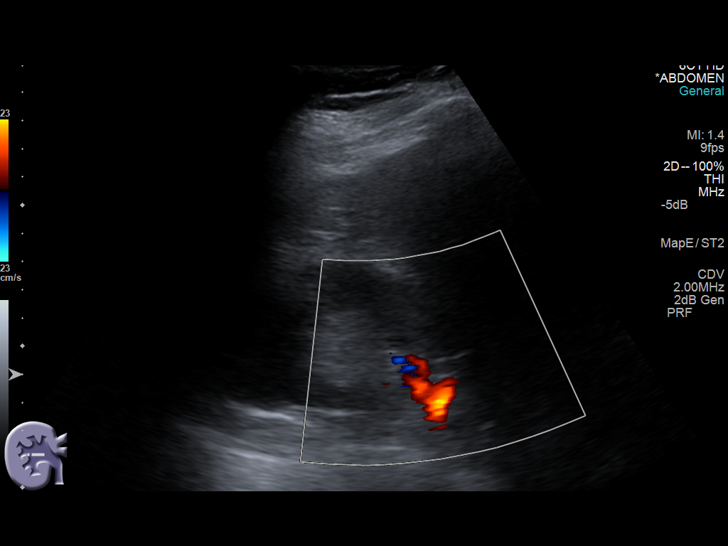
[im 74/89]
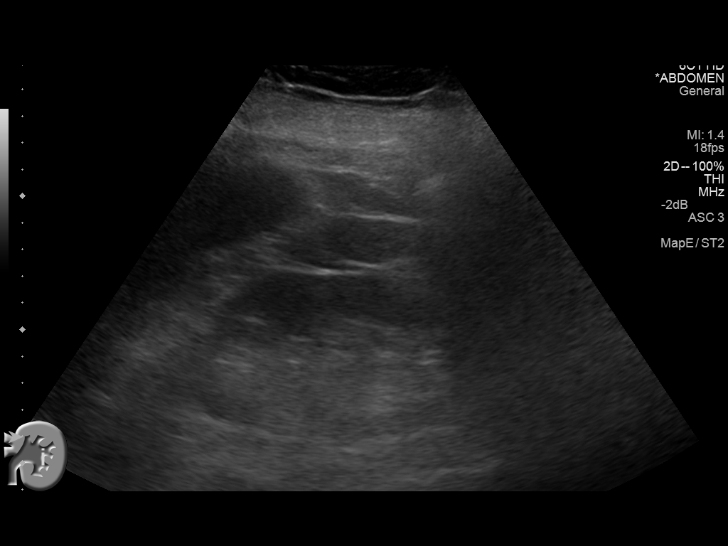
[im 81/89]
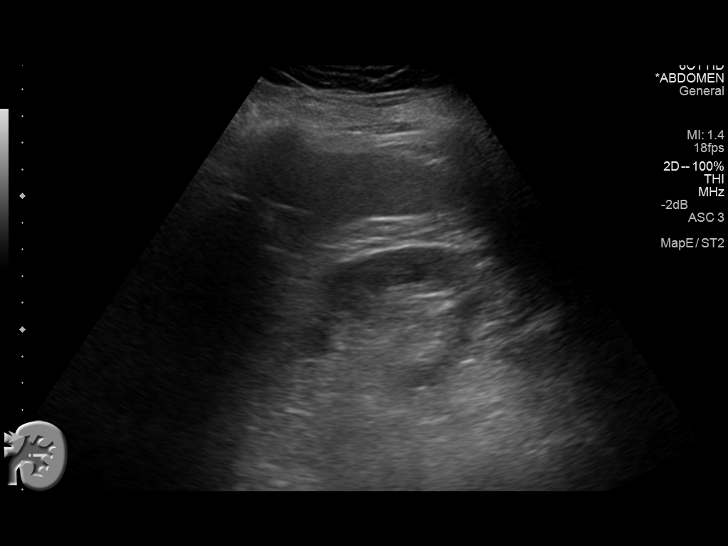
[im 89/89]
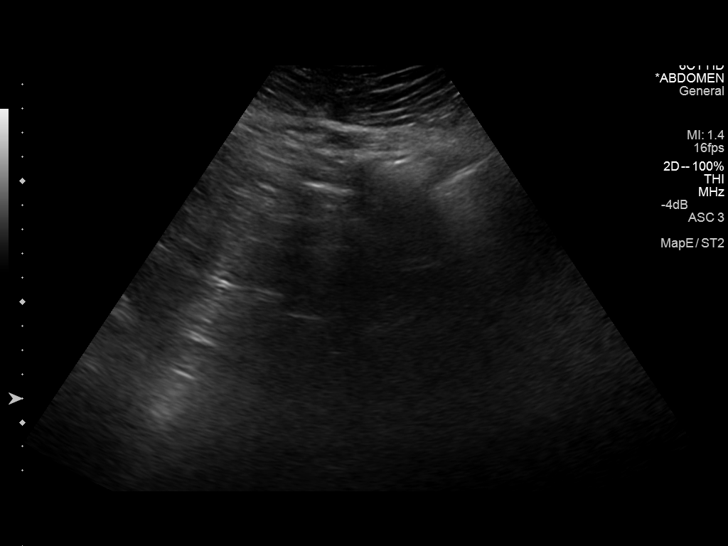

[14 of 25 positions shown; findings below may reference images not displayed]

FINDINGS: Gallbladder: No gallstones, gallbladder wall thickening, or
pericholecystic fluid. Negative sonographic Murphy's sign.

Common bile duct: Diameter: 3 mm

Liver: Coarse hepatic echotexture.  No focal hepatic lesion is seen.

IVC: No abnormality visualized.

Pancreas: Poorly visualized due to overlying bowel gas.

Spleen: Size and appearance within normal limits.

Right Kidney: Length: 11.9 cm. 5.4 x 5.1 x 4.4 cm lower pole simple
cyst. No hydronephrosis.

Left Kidney: Length: 12.4 cm.  No mass or hydronephrosis.

Abdominal aorta: No aneurysm visualized.

Other findings: None.
IMPRESSION: 5.4 cm right lower pole simple renal cyst.

Otherwise negative abdominal ultrasound.

## 2017-05-17 DIAGNOSIS — R51 Headache: Secondary | ICD-10-CM | POA: Diagnosis not present

## 2017-05-17 DIAGNOSIS — E039 Hypothyroidism, unspecified: Secondary | ICD-10-CM | POA: Diagnosis not present

## 2017-05-17 DIAGNOSIS — K219 Gastro-esophageal reflux disease without esophagitis: Secondary | ICD-10-CM | POA: Diagnosis not present

## 2017-05-17 DIAGNOSIS — G894 Chronic pain syndrome: Secondary | ICD-10-CM | POA: Diagnosis not present

## 2017-05-17 DIAGNOSIS — I10 Essential (primary) hypertension: Secondary | ICD-10-CM | POA: Diagnosis not present

## 2017-05-17 DIAGNOSIS — Z6835 Body mass index (BMI) 35.0-35.9, adult: Secondary | ICD-10-CM | POA: Diagnosis not present

## 2017-05-17 DIAGNOSIS — F329 Major depressive disorder, single episode, unspecified: Secondary | ICD-10-CM | POA: Diagnosis not present

## 2017-05-17 DIAGNOSIS — R112 Nausea with vomiting, unspecified: Secondary | ICD-10-CM | POA: Diagnosis not present

## 2017-05-24 DIAGNOSIS — K219 Gastro-esophageal reflux disease without esophagitis: Secondary | ICD-10-CM | POA: Diagnosis not present

## 2017-05-24 DIAGNOSIS — G894 Chronic pain syndrome: Secondary | ICD-10-CM | POA: Diagnosis not present

## 2017-05-24 DIAGNOSIS — Z6835 Body mass index (BMI) 35.0-35.9, adult: Secondary | ICD-10-CM | POA: Diagnosis not present

## 2017-05-24 DIAGNOSIS — I10 Essential (primary) hypertension: Secondary | ICD-10-CM | POA: Diagnosis not present

## 2017-05-24 DIAGNOSIS — F329 Major depressive disorder, single episode, unspecified: Secondary | ICD-10-CM | POA: Diagnosis not present

## 2017-06-01 IMAGING — US US ABDOMEN COMPLETE W/ ELASTOGRAPHY
1 series · 13 of 25 positions shown · non-contrast
Comparison: None.

CLINICAL DATA: Hepatitis-C virus.



[Series 1: us abdomen complete w/ elastography · 0.28mm/px · 13 of 72 slices shown]
[im 1/72]
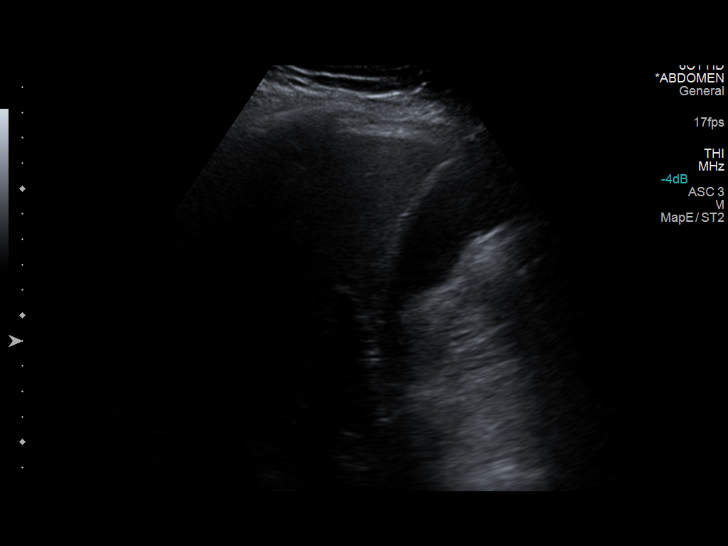
[im 6/72]
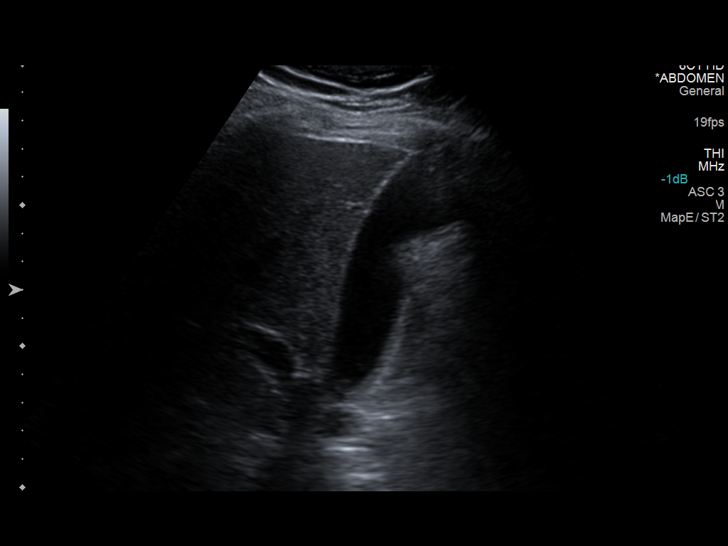
[im 12/72]
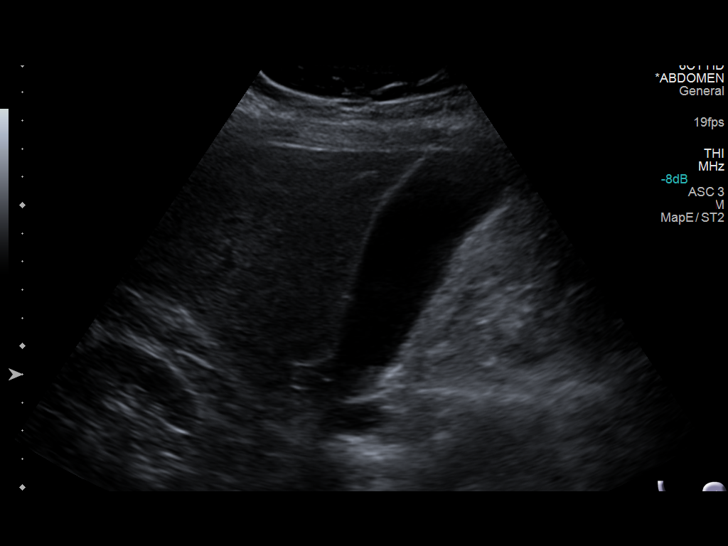
[im 18/72]
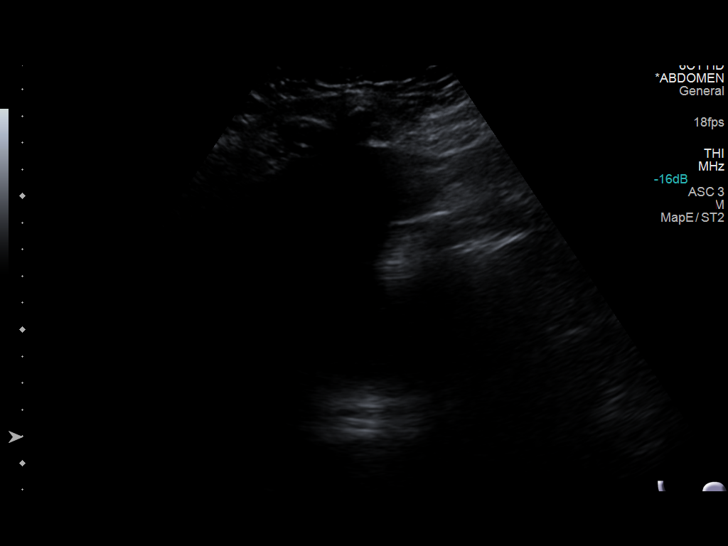
[im 24/72]
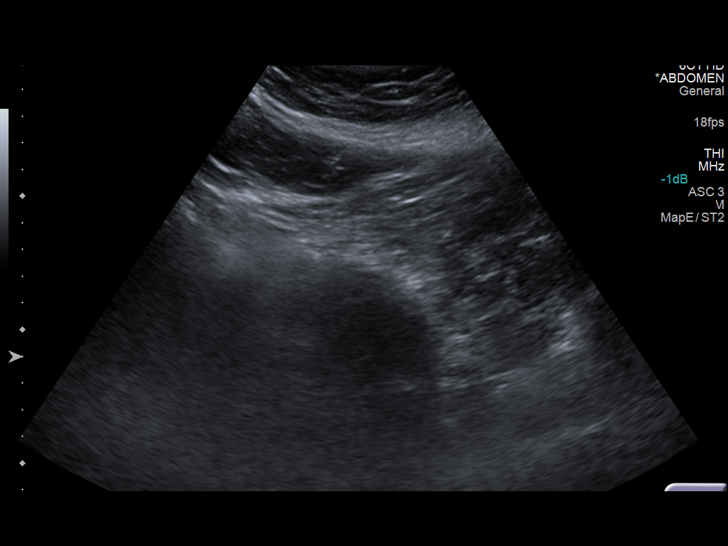
[im 30/72]
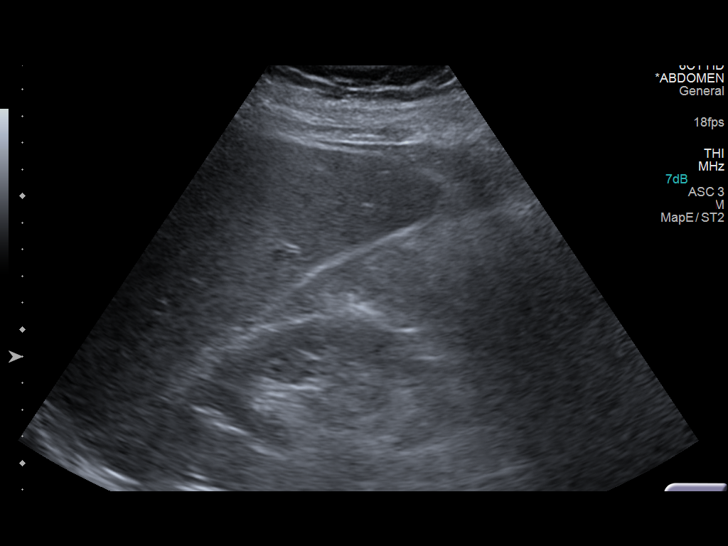
[im 36/72]
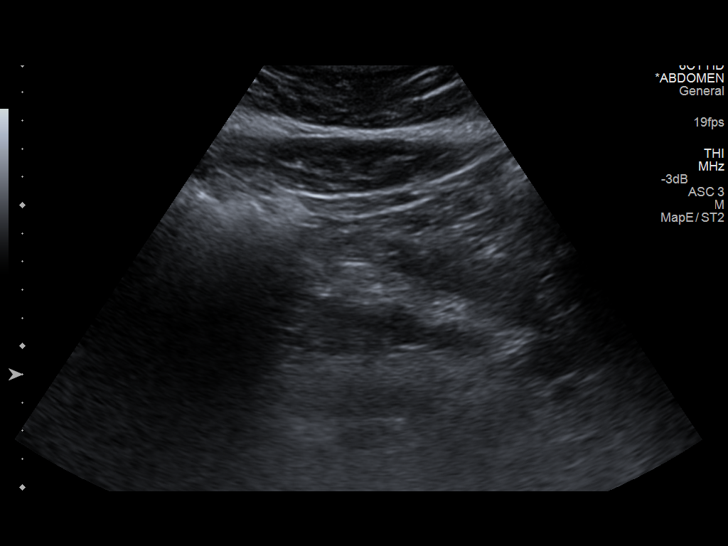
[im 42/72]
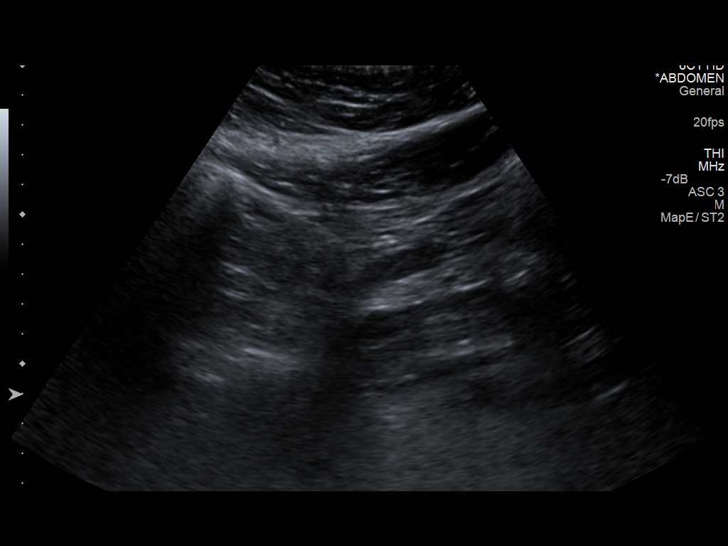
[im 48/72]
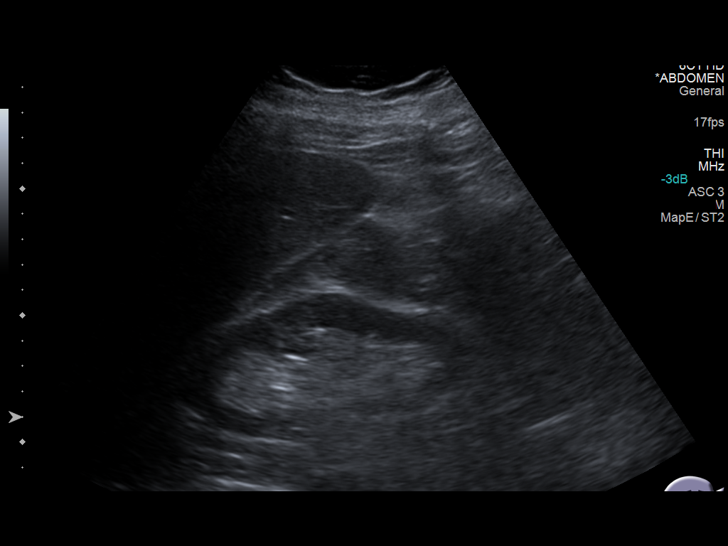
[im 54/72]
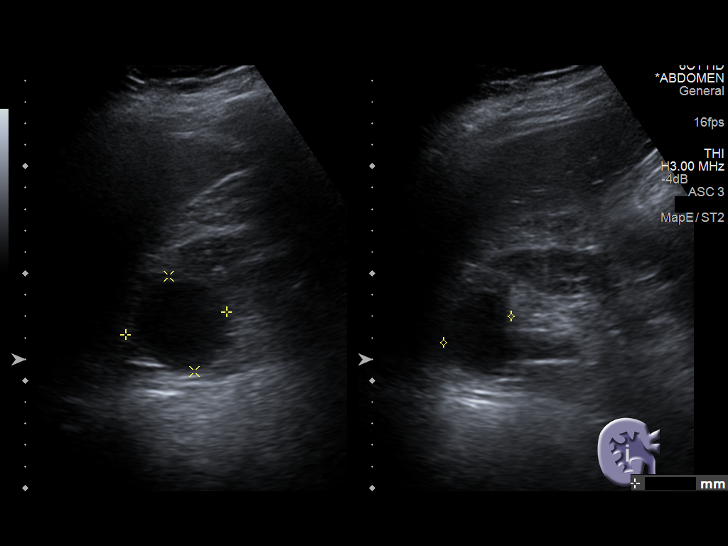
[im 60/72]
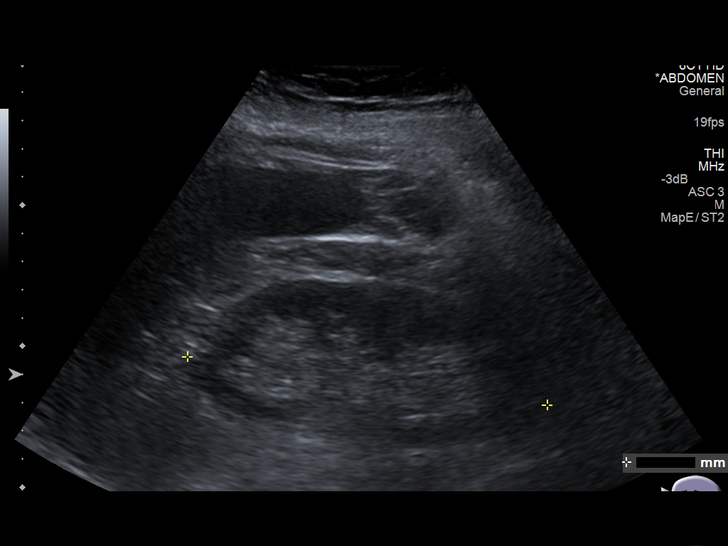
[im 66/72]
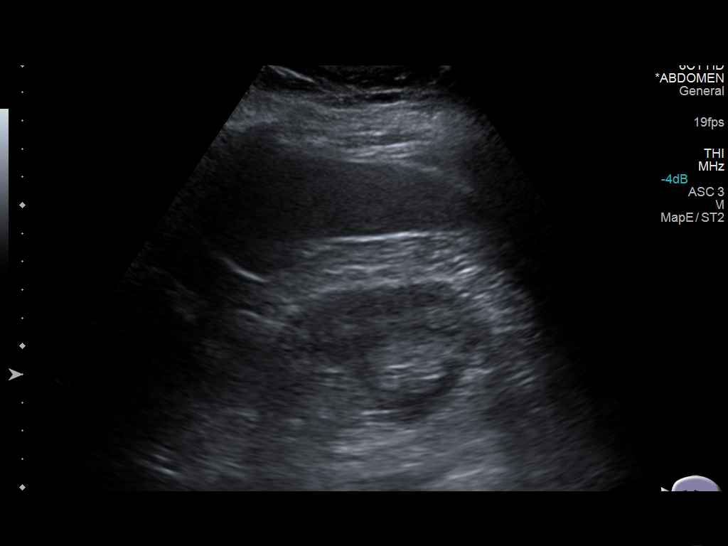
[im 72/72]
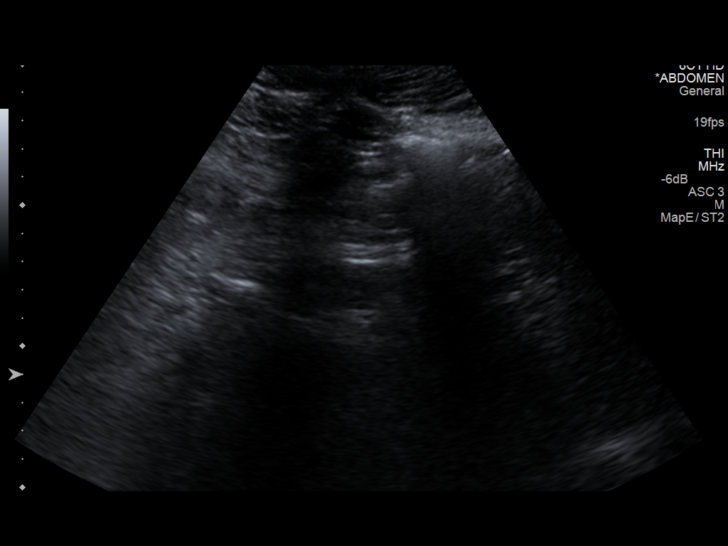

[13 of 25 positions shown; findings below may reference images not displayed]

FINDINGS: ULTRASOUND ABDOMEN

Gallbladder: No gallstones or wall thickening visualized. No
sonographic Murphy sign noted.

Common bile duct: Diameter: 5.6 mm.

Liver: No focal lesion identified. Within normal limits in
parenchymal echogenicity.

IVC: No abnormality visualized.

Pancreas: Visualized portion unremarkable.

Spleen: Size and appearance within normal limits.

Right Kidney: Length: 12.6 cm. Simple appearing cyst is identified
arising from the upper pole 4.8 x 4.6 x 3.4 cm.. Echogenicity within
normal limits. No mass or hydronephrosis visualized.

Left Kidney: Length: 12.9 cm. Echogenicity within normal limits. No
mass or hydronephrosis visualized.

Abdominal aorta: Measures 2.9 cm.

Other findings: None.

ULTRASOUND HEPATIC ELASTOGRAPHY

Device: Siemens Helix VTQ

Transducer 6 C1 HD

Patient position: Left lateral decubitus

Number of measurements:  10

Hepatic Segment:  8

Median velocity:   3.57  m/sec

IQR:

IQR/Median velocity ratio

Corresponding Metavir fibrosis score:  Some F 3 and F 4

Risk of fibrosis: High

Limitations of exam: None

Pertinent findings noted on other imaging exams:  None

Please note that abnormal shear wave velocities may also be
identified in clinical settings other than with hepatic fibrosis,
such as: acute hepatitis, elevated right heart and central venous
pressures including use of beta blockers, Once disease
([place]), infiltrative processes such as
mastocytosis/amyloidosis/infiltrative tumor, extrahepatic
cholestasis, in the post-prandial state, and liver transplantation.
Correlation with patient history, laboratory data, and clinical
condition recommended.
IMPRESSION: 1. Unremarkable appearance of the liver.
2. Ectatic abdominal aorta at risk for aneurysm development.
Recommend followup by ultrasound in 5 years. This recommendation
follows ACR consensus guidelines: White Paper of the ACR Incidental
Findings Committee II on Vascular Findings. [HOSPITAL] 7810;
[DATE].
3. Kidney cyst

Median hepatic shear wave velocity is calculated at 3.57 m/sec.

Corresponding Metavir fibrosis score is some F 3 and F 4.

Risk of fibrosis is high.

Follow-up:  Followup is advised.

## 2017-08-01 DIAGNOSIS — N139 Obstructive and reflux uropathy, unspecified: Secondary | ICD-10-CM | POA: Diagnosis not present

## 2017-08-01 DIAGNOSIS — N390 Urinary tract infection, site not specified: Secondary | ICD-10-CM | POA: Diagnosis not present

## 2017-08-01 DIAGNOSIS — F329 Major depressive disorder, single episode, unspecified: Secondary | ICD-10-CM | POA: Diagnosis not present

## 2017-08-01 DIAGNOSIS — R300 Dysuria: Secondary | ICD-10-CM | POA: Diagnosis not present

## 2017-08-01 DIAGNOSIS — G4700 Insomnia, unspecified: Secondary | ICD-10-CM | POA: Diagnosis not present

## 2017-08-01 DIAGNOSIS — Z6834 Body mass index (BMI) 34.0-34.9, adult: Secondary | ICD-10-CM | POA: Diagnosis not present

## 2017-08-01 DIAGNOSIS — N9989 Other postprocedural complications and disorders of genitourinary system: Secondary | ICD-10-CM | POA: Diagnosis not present

## 2017-08-15 DIAGNOSIS — N390 Urinary tract infection, site not specified: Secondary | ICD-10-CM | POA: Diagnosis not present

## 2017-08-15 DIAGNOSIS — R300 Dysuria: Secondary | ICD-10-CM | POA: Diagnosis not present

## 2017-08-15 DIAGNOSIS — Z0001 Encounter for general adult medical examination with abnormal findings: Secondary | ICD-10-CM | POA: Diagnosis not present

## 2017-08-15 DIAGNOSIS — F329 Major depressive disorder, single episode, unspecified: Secondary | ICD-10-CM | POA: Diagnosis not present

## 2017-08-15 DIAGNOSIS — N9989 Other postprocedural complications and disorders of genitourinary system: Secondary | ICD-10-CM | POA: Diagnosis not present

## 2017-09-14 DIAGNOSIS — R112 Nausea with vomiting, unspecified: Secondary | ICD-10-CM | POA: Diagnosis not present

## 2017-09-14 DIAGNOSIS — M5489 Other dorsalgia: Secondary | ICD-10-CM | POA: Diagnosis not present

## 2017-09-14 DIAGNOSIS — R1111 Vomiting without nausea: Secondary | ICD-10-CM | POA: Diagnosis not present

## 2017-09-18 DIAGNOSIS — K120 Recurrent oral aphthae: Secondary | ICD-10-CM | POA: Diagnosis not present

## 2017-09-18 DIAGNOSIS — Z6837 Body mass index (BMI) 37.0-37.9, adult: Secondary | ICD-10-CM | POA: Diagnosis not present

## 2017-09-18 DIAGNOSIS — R739 Hyperglycemia, unspecified: Secondary | ICD-10-CM | POA: Diagnosis not present

## 2017-09-19 ENCOUNTER — Ambulatory Visit (INDEPENDENT_AMBULATORY_CARE_PROVIDER_SITE_OTHER): Payer: Medicare Other | Admitting: Urology

## 2017-09-19 ENCOUNTER — Other Ambulatory Visit (HOSPITAL_COMMUNITY)
Admission: AD | Admit: 2017-09-19 | Discharge: 2017-09-19 | Disposition: A | Discharge status: Home / Self Care | Payer: Medicare Other | Source: Skilled Nursing Facility | Attending: Urology | Admitting: Urology

## 2017-09-19 DIAGNOSIS — R3914 Feeling of incomplete bladder emptying: Secondary | ICD-10-CM | POA: Diagnosis not present

## 2017-09-19 DIAGNOSIS — R972 Elevated prostate specific antigen [PSA]: Secondary | ICD-10-CM

## 2017-09-19 DIAGNOSIS — R300 Dysuria: Secondary | ICD-10-CM

## 2017-09-20 LAB — URINE CULTURE: CULTURE: NO GROWTH

## 2017-09-25 DIAGNOSIS — G4700 Insomnia, unspecified: Secondary | ICD-10-CM | POA: Diagnosis not present

## 2017-09-25 DIAGNOSIS — Z6837 Body mass index (BMI) 37.0-37.9, adult: Secondary | ICD-10-CM | POA: Diagnosis not present

## 2017-09-25 DIAGNOSIS — N9989 Other postprocedural complications and disorders of genitourinary system: Secondary | ICD-10-CM | POA: Diagnosis not present

## 2017-09-25 DIAGNOSIS — N3000 Acute cystitis without hematuria: Secondary | ICD-10-CM | POA: Diagnosis not present

## 2017-09-27 DIAGNOSIS — G4700 Insomnia, unspecified: Secondary | ICD-10-CM | POA: Diagnosis not present

## 2017-09-27 DIAGNOSIS — N9989 Other postprocedural complications and disorders of genitourinary system: Secondary | ICD-10-CM | POA: Diagnosis not present

## 2017-09-27 DIAGNOSIS — N3000 Acute cystitis without hematuria: Secondary | ICD-10-CM | POA: Diagnosis not present

## 2017-09-27 DIAGNOSIS — Z6837 Body mass index (BMI) 37.0-37.9, adult: Secondary | ICD-10-CM | POA: Diagnosis not present

## 2017-09-28 DIAGNOSIS — R300 Dysuria: Secondary | ICD-10-CM | POA: Diagnosis not present

## 2017-09-28 DIAGNOSIS — N390 Urinary tract infection, site not specified: Secondary | ICD-10-CM | POA: Diagnosis not present

## 2017-09-29 DIAGNOSIS — N342 Other urethritis: Secondary | ICD-10-CM | POA: Diagnosis not present

## 2017-09-29 DIAGNOSIS — Z6837 Body mass index (BMI) 37.0-37.9, adult: Secondary | ICD-10-CM | POA: Diagnosis not present

## 2017-09-29 DIAGNOSIS — N3000 Acute cystitis without hematuria: Secondary | ICD-10-CM | POA: Diagnosis not present

## 2017-09-29 DIAGNOSIS — N9989 Other postprocedural complications and disorders of genitourinary system: Secondary | ICD-10-CM | POA: Diagnosis not present

## 2017-10-20 DIAGNOSIS — Z6836 Body mass index (BMI) 36.0-36.9, adult: Secondary | ICD-10-CM | POA: Diagnosis not present

## 2017-10-20 DIAGNOSIS — N3001 Acute cystitis with hematuria: Secondary | ICD-10-CM | POA: Diagnosis not present

## 2017-10-21 DIAGNOSIS — R300 Dysuria: Secondary | ICD-10-CM | POA: Diagnosis not present

## 2017-12-16 DIAGNOSIS — Z6836 Body mass index (BMI) 36.0-36.9, adult: Secondary | ICD-10-CM | POA: Diagnosis not present

## 2017-12-16 DIAGNOSIS — N390 Urinary tract infection, site not specified: Secondary | ICD-10-CM | POA: Diagnosis not present

## 2017-12-16 DIAGNOSIS — N3000 Acute cystitis without hematuria: Secondary | ICD-10-CM | POA: Diagnosis not present

## 2017-12-18 DIAGNOSIS — N390 Urinary tract infection, site not specified: Secondary | ICD-10-CM | POA: Diagnosis not present

## 2017-12-18 DIAGNOSIS — N3000 Acute cystitis without hematuria: Secondary | ICD-10-CM | POA: Diagnosis not present

## 2017-12-19 DIAGNOSIS — N3000 Acute cystitis without hematuria: Secondary | ICD-10-CM | POA: Diagnosis not present

## 2017-12-19 DIAGNOSIS — I10 Essential (primary) hypertension: Secondary | ICD-10-CM | POA: Diagnosis not present

## 2017-12-20 DIAGNOSIS — Z6836 Body mass index (BMI) 36.0-36.9, adult: Secondary | ICD-10-CM | POA: Diagnosis not present

## 2017-12-20 DIAGNOSIS — M25511 Pain in right shoulder: Secondary | ICD-10-CM | POA: Diagnosis not present

## 2017-12-20 DIAGNOSIS — R112 Nausea with vomiting, unspecified: Secondary | ICD-10-CM | POA: Diagnosis not present

## 2017-12-20 DIAGNOSIS — G894 Chronic pain syndrome: Secondary | ICD-10-CM | POA: Diagnosis not present

## 2017-12-20 DIAGNOSIS — I10 Essential (primary) hypertension: Secondary | ICD-10-CM | POA: Diagnosis not present

## 2018-01-08 DIAGNOSIS — G894 Chronic pain syndrome: Secondary | ICD-10-CM | POA: Diagnosis not present

## 2018-01-08 DIAGNOSIS — Z79899 Other long term (current) drug therapy: Secondary | ICD-10-CM | POA: Diagnosis not present

## 2018-01-08 DIAGNOSIS — Z79891 Long term (current) use of opiate analgesic: Secondary | ICD-10-CM | POA: Diagnosis not present

## 2018-01-08 DIAGNOSIS — R200 Anesthesia of skin: Secondary | ICD-10-CM | POA: Diagnosis not present

## 2018-01-08 DIAGNOSIS — M961 Postlaminectomy syndrome, not elsewhere classified: Secondary | ICD-10-CM | POA: Diagnosis not present

## 2018-01-08 DIAGNOSIS — M79651 Pain in right thigh: Secondary | ICD-10-CM | POA: Diagnosis not present

## 2018-01-18 DIAGNOSIS — N3000 Acute cystitis without hematuria: Secondary | ICD-10-CM | POA: Diagnosis not present

## 2018-01-18 DIAGNOSIS — Z6836 Body mass index (BMI) 36.0-36.9, adult: Secondary | ICD-10-CM | POA: Diagnosis not present

## 2018-01-23 DIAGNOSIS — G609 Hereditary and idiopathic neuropathy, unspecified: Secondary | ICD-10-CM | POA: Diagnosis not present

## 2018-01-23 DIAGNOSIS — G894 Chronic pain syndrome: Secondary | ICD-10-CM | POA: Diagnosis not present

## 2018-01-23 DIAGNOSIS — Z79891 Long term (current) use of opiate analgesic: Secondary | ICD-10-CM | POA: Diagnosis not present

## 2018-01-23 DIAGNOSIS — M792 Neuralgia and neuritis, unspecified: Secondary | ICD-10-CM | POA: Diagnosis not present

## 2018-01-23 DIAGNOSIS — F4542 Pain disorder with related psychological factors: Secondary | ICD-10-CM | POA: Diagnosis not present

## 2018-01-23 DIAGNOSIS — Z79899 Other long term (current) drug therapy: Secondary | ICD-10-CM | POA: Diagnosis not present

## 2018-02-05 DIAGNOSIS — M545 Low back pain: Secondary | ICD-10-CM | POA: Diagnosis not present

## 2018-02-05 DIAGNOSIS — Z79891 Long term (current) use of opiate analgesic: Secondary | ICD-10-CM | POA: Diagnosis not present

## 2018-02-05 DIAGNOSIS — G894 Chronic pain syndrome: Secondary | ICD-10-CM | POA: Diagnosis not present

## 2018-02-05 DIAGNOSIS — Z79899 Other long term (current) drug therapy: Secondary | ICD-10-CM | POA: Diagnosis not present

## 2018-02-12 ENCOUNTER — Emergency Department (HOSPITAL_COMMUNITY): Payer: Medicare Other

## 2018-02-12 ENCOUNTER — Emergency Department (HOSPITAL_COMMUNITY)
Admission: EM | Admit: 2018-02-12 | Discharge: 2018-02-12 | Disposition: A | Discharge status: Home / Self Care | Payer: Medicare Other | Attending: Emergency Medicine | Admitting: Emergency Medicine

## 2018-02-12 ENCOUNTER — Other Ambulatory Visit: Payer: Self-pay

## 2018-02-12 ENCOUNTER — Encounter (HOSPITAL_COMMUNITY): Payer: Self-pay

## 2018-02-12 DIAGNOSIS — R9431 Abnormal electrocardiogram [ECG] [EKG]: Secondary | ICD-10-CM | POA: Diagnosis not present

## 2018-02-12 DIAGNOSIS — R062 Wheezing: Secondary | ICD-10-CM | POA: Diagnosis not present

## 2018-02-12 DIAGNOSIS — R609 Edema, unspecified: Secondary | ICD-10-CM | POA: Insufficient documentation

## 2018-02-12 DIAGNOSIS — Z87891 Personal history of nicotine dependence: Secondary | ICD-10-CM | POA: Insufficient documentation

## 2018-02-12 DIAGNOSIS — E039 Hypothyroidism, unspecified: Secondary | ICD-10-CM | POA: Insufficient documentation

## 2018-02-12 DIAGNOSIS — R0789 Other chest pain: Secondary | ICD-10-CM

## 2018-02-12 DIAGNOSIS — R0902 Hypoxemia: Secondary | ICD-10-CM | POA: Diagnosis not present

## 2018-02-12 DIAGNOSIS — I10 Essential (primary) hypertension: Secondary | ICD-10-CM | POA: Diagnosis not present

## 2018-02-12 DIAGNOSIS — R079 Chest pain, unspecified: Secondary | ICD-10-CM | POA: Diagnosis not present

## 2018-02-12 DIAGNOSIS — Z79899 Other long term (current) drug therapy: Secondary | ICD-10-CM | POA: Diagnosis not present

## 2018-02-12 DIAGNOSIS — R0602 Shortness of breath: Secondary | ICD-10-CM | POA: Diagnosis not present

## 2018-02-12 LAB — CBC WITH DIFFERENTIAL/PLATELET
Abs Immature Granulocytes: 0.02 10*3/uL (ref 0.00–0.07)
Basophils Absolute: 0.1 10*3/uL (ref 0.0–0.1)
Basophils Relative: 1 %
Eosinophils Absolute: 0.5 10*3/uL (ref 0.0–0.5)
Eosinophils Relative: 8 %
HCT: 37.4 % — ABNORMAL LOW (ref 39.0–52.0)
Hemoglobin: 11.7 g/dL — ABNORMAL LOW (ref 13.0–17.0)
Immature Granulocytes: 0 %
Lymphocytes Relative: 19 %
Lymphs Abs: 1 10*3/uL (ref 0.7–4.0)
MCH: 28.6 pg (ref 26.0–34.0)
MCHC: 31.3 g/dL (ref 30.0–36.0)
MCV: 91.4 fL (ref 80.0–100.0)
Monocytes Absolute: 0.4 10*3/uL (ref 0.1–1.0)
Monocytes Relative: 8 %
Neutro Abs: 3.6 10*3/uL (ref 1.7–7.7)
Neutrophils Relative %: 64 %
Platelets: 168 10*3/uL (ref 150–400)
RBC: 4.09 MIL/uL — ABNORMAL LOW (ref 4.22–5.81)
RDW: 13.1 % (ref 11.5–15.5)
WBC: 5.6 10*3/uL (ref 4.0–10.5)
nRBC: 0 % (ref 0.0–0.2)

## 2018-02-12 LAB — BRAIN NATRIURETIC PEPTIDE: B Natriuretic Peptide: 53 pg/mL (ref 0.0–100.0)

## 2018-02-12 LAB — BASIC METABOLIC PANEL
Anion gap: 10 (ref 5–15)
BUN: 12 mg/dL (ref 8–23)
CO2: 24 mmol/L (ref 22–32)
Calcium: 9.1 mg/dL (ref 8.9–10.3)
Chloride: 103 mmol/L (ref 98–111)
Creatinine, Ser: 0.97 mg/dL (ref 0.61–1.24)
GFR calc Af Amer: >60 mL/min (ref >60)
GFR calc non Af Amer: >60 mL/min (ref >60)
Glucose, Bld: 86 mg/dL (ref 70–99)
Potassium: 3.7 mmol/L (ref 3.5–5.1)
Sodium: 137 mmol/L (ref 135–145)

## 2018-02-12 LAB — TROPONIN I: Troponin I: <0.03 ng/mL (ref <0.03)

## 2018-02-12 MED ORDER — FUROSEMIDE 20 MG PO TABS: 20.0000 mg | ORAL_TABLET | Freq: Two times a day (BID) | ORAL | 0 refills | Status: DC

## 2018-02-12 MED ORDER — POTASSIUM CHLORIDE ER 10 MEQ PO TBCR: 10.0000 meq | EXTENDED_RELEASE_TABLET | Freq: Two times a day (BID) | ORAL | 0 refills | Status: DC

## 2018-02-12 MED ORDER — FUROSEMIDE 10 MG/ML IJ SOLN
80.0000 mg | Freq: Once | INTRAMUSCULAR | Status: AC
  Administered 2018-02-12: 80 mg via INTRAVENOUS
  Filled 2018-02-12: qty 8

## 2018-02-12 NOTE — ED Notes (Signed)
Pt to xray

## 2018-02-12 NOTE — ED Provider Notes (Signed)
[facility] EMERGENCY DEPARTMENT Provider Note   CSN: [id number] Arrival date & time: 02/12/18  1145     History   Chief Complaint Chief Complaint  Patient presents with  . Chest Pain    HPI Kenneth Rhodes is a 72 y.o. male.  HPI   72 year old male with intermittent chest tightness and "wheezing."  Symptom onset somewhat insidious couple weeks ago.  He feels like it is slowly but progressively worsening.  Shortness of breath at times and sometimes has a sense that he is wheezing.  Occasional nonproductive cough.  Denies orthopnea specifically but says because of chronic back issues he really does not lie flat anyways.  He has had progressive lower extremity edema.  He self-reports approximately 20 pound unintentional weight gain over the past few weeks.  No fevers or chills.  Past Medical History:  Diagnosis Date  . Arthritis   . Bladder spasms   . Blood transfusion without reported diagnosis   . BPH (benign prostatic hypertrophy)   . C. difficile diarrhea   . Chronic hepatitis C ([place])   . Depression   . GERD (gastroesophageal reflux disease)   . Hepatitis C   . Hyperlipidemia   . Hypertension   . Hypothyroidism   . Liver disease   . MRSA (methicillin resistant staph aureus) culture positive   . Neurogenic bladder   . Obesity   . Sleep apnea   . Substance abuse ([place])    pain medication    Patient Active Problem List   Diagnosis Date Noted  . GAD (generalized anxiety disorder) 01/19/2015  . Lower abdominal pain 10/22/2014  . Diarrhea 10/22/2014  . Hepatitis C 10/17/2014  . Testosterone deficiency 06/09/2014  . Vitamin D deficiency 06/09/2014  . Opiate addiction ([place]) 03/26/2014  . Opiate withdrawal ([place]) 03/26/2014  . Depression, major, recurrent ([place]) 03/26/2014  . Suicidal ideation 03/26/2014  . Chronic pain 03/26/2014  . Lumbar radiculopathy, chronic 03/26/2014  . Hypothyroidism 03/26/2014  . BPH (benign prostatic hyperplasia) 03/26/2014  .  Bladder spasm 03/26/2014  . Opioid dependence with opioid-induced mood disorder [facility])     Past Surgical History:  Procedure Laterality Date  . COLONOSCOPY     ovr 10 yrs ago in [place]. pt said normal exam  . [place] SURGERY     2008, 2015, 2017  . LUMBAR EPIDURAL INJECTION    . MEDIAL PARTIAL KNEE REPLACEMENT Right   . SPINE SURGERY     due to mrsa  . SUPRAPUBIC CATHETER INSERTION          Home Medications    Prior to Admission medications   Medication Sig Start Date End Date Taking? Authorizing Provider  cloNIDine HCl (KAPVAY) 0.1 MG TB12 ER tablet Take 0.1 mg by mouth.    [provider]  furosemide (LASIX) 20 MG tablet Take one tablet every other day 04/21/16   Wardell Honour, MD  HYDROmorphone (DILAUDID) 2 MG tablet Take 1 tablet by mouth every 6 (six) hours as needed. 04/06/16   [provider]  levothyroxine (SYNTHROID, LEVOTHROID) 125 MCG tablet TAKE ONE TABLET EVERY MORNING BEFORE BREAKFAST 04/26/16   Wardell Honour, MD  linaclotide [facility]) 290 MCG CAPS capsule Take 290 mcg by mouth daily before breakfast.    [provider]  omeprazole (PRILOSEC) 40 MG capsule Take 40 mg by mouth 2 (two) times daily.    [provider]  Probiotic Product (PROBIOTIC FORMULA PO) Take 1 capsule by mouth daily.    [provider]  tamsulosin (FLOMAX) 0.4 MG CAPS capsule Take 0.4 mg by mouth every evening.     [provider]    Family History Family History  Problem Relation Age of Onset  . Ovarian cancer Mother   . Colon cancer Mother        dx age 20  . AAA (abdominal aortic aneurysm) Father   . Esophageal cancer Neg Hx   . Kidney disease Neg Hx   . Liver disease Neg Hx     Social History Social History   Tobacco Use  . Smoking status: Former Smoker    Types: Cigarettes    Last attempt to quit: 03/21/1994    Years since quitting: 23.9  . Smokeless tobacco: Never Used  Substance Use Topics  . Alcohol use: No     Alcohol/week: 0.0 standard drinks  . Drug use: No    Comment: heroin- pulled forward from previoius visit, pt denies drug use.     Allergies   Hydrocodone   Review of Systems Review of Systems  All systems reviewed and negative, other than as noted in HPI.  Physical Exam Updated Vital Signs BP 130/85 (BP Location: Right Arm)   Pulse 64   Temp 98.4 F (36.9 C) (Oral)   Resp 13   Ht 6' (1.829 m)   Wt 127 kg   SpO2 96%   BMI 37.97 kg/m   Physical Exam  Constitutional: He appears well-developed and well-nourished. No distress.  Laying in bed.  No acute distress.  Obese.  HENT:  Head: Normocephalic and atraumatic.  Eyes: Conjunctivae are normal. Right eye exhibits no discharge. Left eye exhibits no discharge.  Neck: Neck supple.  Cardiovascular: Normal rate, regular rhythm and normal heart sounds. Exam reveals no gallop and no friction rub.  No murmur heard. Pulmonary/Chest: Effort normal and breath sounds normal. No respiratory distress.  Abdominal: Soft. He exhibits no distension. There is no tenderness.  Musculoskeletal: He exhibits edema. He exhibits no tenderness.  Symmetric pitting lower extremity edema  Neurological: He is alert.  Skin: Skin is warm and dry.  Psychiatric: He has a normal mood and affect. His behavior is normal. Thought content normal.  Nursing note and vitals reviewed.    ED Treatments / Results  Labs (all labs ordered are listed, but only abnormal results are displayed) Labs Reviewed  CBC WITH DIFFERENTIAL/PLATELET - Abnormal; Notable for the following components:      Result Value   RBC 4.09 (*)    Hemoglobin 11.7 (*)    HCT 37.4 (*)    All other components within normal limits  BASIC METABOLIC PANEL  TROPONIN I  BRAIN NATRIURETIC PEPTIDE    EKG EKG Interpretation  Date/Time:  Monday February 12 2018 11:52:22 EST Ventricular Rate:  74 PR Interval:    QRS Duration: 89 QT Interval:  356 QTC Calculation: 395 R Axis:   34 Text  Interpretation:  Sinus rhythm Confirmed by Virgel Manifold [phone]) on 02/12/2018 12:53:58 PM   Radiology Dg Chest 2 View  Result Date: 02/12/2018 CLINICAL DATA:  Initial evaluation for acute dyspnea, chest pain. EXAM: CHEST - 2 VIEW COMPARISON:  Prior radiograph from 11/26/2014. FINDINGS: Mild cardiomegaly.  Mediastinal silhouette normal. Lungs mildly hypoinflated with elevation of the left hemidiaphragm, similar to previous. Mild diffuse pulmonary vascular congestion without overt pulmonary edema. No focal infiltrates. No pleural effusion. No pneumothorax. No acute osseus abnormality. Thoracolumbar fixation partially visualized. IMPRESSION: 1. Cardiomegaly with mild diffuse pulmonary vascular congestion without overt  pulmonary edema. 2. No other active cardiopulmonary disease. Electronically Signed   By: Jeannine Boga M.D.   On: 02/12/2018 13:32    Procedures Procedures (including critical care time)  Medications Ordered in ED Medications - No data to display   Initial Impression / Assessment and Plan / ED Course  I have reviewed the triage vital signs and the nursing notes.  Pertinent labs & imaging results that were available during my care of the patient were reviewed by me and considered in my medical decision making (see chart for details).     72 year old male with intermittent chest tightness.  Seems atypical for ACS.  EKG without overt ischemic changes.  Troponin is normal.  Reports that currently he does not feel short of breath nor is he having the tightness.  His oxygen saturations are high 90s on room air and he looks pretty comfortable.  He does have a pretty significant lower extremity edema though.  He reports recent 20 pound unintentional weight gain.  We will start him on a diuretic.  Final Clinical Impressions(s) / ED Diagnoses   Final diagnoses:  Peripheral edema  Chest tightness    ED Discharge Orders    None       Virgel Manifold, MD 02/14/18 [phone]

## 2018-02-12 NOTE — ED Triage Notes (Addendum)
Pt brought in by EMS due chest tightness and tightness with breathing for one week. Noted to have LE edema. Pt brought from urgent care and he reports 20 lb weight gain

## 2018-02-14 ENCOUNTER — Other Ambulatory Visit: Payer: Self-pay

## 2018-02-14 ENCOUNTER — Observation Stay (HOSPITAL_BASED_OUTPATIENT_CLINIC_OR_DEPARTMENT_OTHER): Payer: Medicare Other

## 2018-02-14 ENCOUNTER — Encounter (HOSPITAL_COMMUNITY): Payer: Self-pay | Admitting: Emergency Medicine

## 2018-02-14 ENCOUNTER — Emergency Department (HOSPITAL_COMMUNITY): Payer: Medicare Other

## 2018-02-14 ENCOUNTER — Observation Stay (HOSPITAL_COMMUNITY)
Admission: EM | Admit: 2018-02-14 | Discharge: 2018-02-15 | Disposition: A | Discharge status: Home / Self Care | Payer: Medicare Other | Attending: Student | Admitting: Student

## 2018-02-14 DIAGNOSIS — R0602 Shortness of breath: Secondary | ICD-10-CM

## 2018-02-14 DIAGNOSIS — I10 Essential (primary) hypertension: Secondary | ICD-10-CM | POA: Diagnosis present

## 2018-02-14 DIAGNOSIS — Z79899 Other long term (current) drug therapy: Secondary | ICD-10-CM | POA: Insufficient documentation

## 2018-02-14 DIAGNOSIS — E039 Hypothyroidism, unspecified: Secondary | ICD-10-CM | POA: Diagnosis not present

## 2018-02-14 DIAGNOSIS — I110 Hypertensive heart disease with heart failure: Secondary | ICD-10-CM | POA: Insufficient documentation

## 2018-02-14 DIAGNOSIS — I5031 Acute diastolic (congestive) heart failure: Secondary | ICD-10-CM

## 2018-02-14 DIAGNOSIS — I509 Heart failure, unspecified: Principal | ICD-10-CM

## 2018-02-14 DIAGNOSIS — G8929 Other chronic pain: Secondary | ICD-10-CM | POA: Diagnosis not present

## 2018-02-14 DIAGNOSIS — Z87891 Personal history of nicotine dependence: Secondary | ICD-10-CM | POA: Diagnosis not present

## 2018-02-14 DIAGNOSIS — R0789 Other chest pain: Secondary | ICD-10-CM | POA: Diagnosis present

## 2018-02-14 DIAGNOSIS — E871 Hypo-osmolality and hyponatremia: Secondary | ICD-10-CM | POA: Diagnosis present

## 2018-02-14 DIAGNOSIS — R079 Chest pain, unspecified: Secondary | ICD-10-CM | POA: Diagnosis not present

## 2018-02-14 LAB — CBC WITH DIFFERENTIAL/PLATELET
Abs Immature Granulocytes: 0.01 10*3/uL (ref 0.00–0.07)
Basophils Absolute: 0 10*3/uL (ref 0.0–0.1)
Basophils Relative: 1 %
EOS ABS: 0.3 10*3/uL (ref 0.0–0.5)
EOS PCT: 4 %
HEMATOCRIT: 37.1 % — AB (ref 39.0–52.0)
Hemoglobin: 11.8 g/dL — ABNORMAL LOW (ref 13.0–17.0)
Immature Granulocytes: 0 %
LYMPHS ABS: 1.4 10*3/uL (ref 0.7–4.0)
Lymphocytes Relative: 18 %
MCH: 27.6 pg (ref 26.0–34.0)
MCHC: 31.8 g/dL (ref 30.0–36.0)
MCV: 86.7 fL (ref 80.0–100.0)
MONO ABS: 0.7 10*3/uL (ref 0.1–1.0)
MONOS PCT: 9 %
NRBC: 0 % (ref 0.0–0.2)
Neutro Abs: 5.2 10*3/uL (ref 1.7–7.7)
Neutrophils Relative %: 68 %
Platelets: 213 10*3/uL (ref 150–400)
RBC: 4.28 MIL/uL (ref 4.22–5.81)
RDW: 13.1 % (ref 11.5–15.5)
WBC: 7.6 10*3/uL (ref 4.0–10.5)

## 2018-02-14 LAB — COMPREHENSIVE METABOLIC PANEL
ALK PHOS: 81 U/L (ref 38–126)
ALT: 21 U/L (ref 0–44)
ANION GAP: 10 (ref 5–15)
AST: 19 U/L (ref 15–41)
Albumin: 4.4 g/dL (ref 3.5–5.0)
BILIRUBIN TOTAL: 0.9 mg/dL (ref 0.3–1.2)
BUN: 14 mg/dL (ref 8–23)
CALCIUM: 9.1 mg/dL (ref 8.9–10.3)
CO2: 26 mmol/L (ref 22–32)
Chloride: 96 mmol/L — ABNORMAL LOW (ref 98–111)
Creatinine, Ser: 1.16 mg/dL (ref 0.61–1.24)
GFR calc Af Amer: >60 mL/min (ref >60)
Glucose, Bld: 138 mg/dL — ABNORMAL HIGH (ref 70–99)
Potassium: 3.7 mmol/L (ref 3.5–5.1)
Sodium: 132 mmol/L — ABNORMAL LOW (ref 135–145)
Total Protein: 7.6 g/dL (ref 6.5–8.1)

## 2018-02-14 LAB — BRAIN NATRIURETIC PEPTIDE: B Natriuretic Peptide: 36 pg/mL (ref 0.0–100.0)

## 2018-02-14 LAB — SODIUM, URINE, RANDOM: SODIUM UR: 79 mmol/L

## 2018-02-14 LAB — OSMOLALITY, URINE: OSMOLALITY UR: 230 mosm/kg — AB (ref 300–900)

## 2018-02-14 LAB — TROPONIN I: Troponin I: <0.03 ng/mL (ref <0.03)

## 2018-02-14 LAB — TSH: TSH: 3.582 u[IU]/mL (ref 0.350–4.500)

## 2018-02-14 MED ORDER — ACETAMINOPHEN 325 MG PO TABS
650.0000 mg | ORAL_TABLET | Freq: Four times a day (QID) | ORAL | Status: DC | PRN
  Administered 2018-02-14: 650 mg via ORAL
  Filled 2018-02-14: qty 2

## 2018-02-14 MED ORDER — ASPIRIN 81 MG PO CHEW
324.0000 mg | CHEWABLE_TABLET | Freq: Once | ORAL | Status: AC
  Administered 2018-02-14: 324 mg via ORAL
  Filled 2018-02-14: qty 4

## 2018-02-14 MED ORDER — FUROSEMIDE 10 MG/ML IJ SOLN
40.0000 mg | Freq: Once | INTRAMUSCULAR | Status: AC
  Administered 2018-02-14: 40 mg via INTRAVENOUS
  Filled 2018-02-14: qty 4

## 2018-02-14 MED ORDER — ENOXAPARIN SODIUM 40 MG/0.4ML ~~LOC~~ SOLN
40.0000 mg | SUBCUTANEOUS | Status: DC
  Administered 2018-02-14 – 2018-02-15 (×2): 40 mg via SUBCUTANEOUS
  Filled 2018-02-14 (×2): qty 0.4

## 2018-02-14 MED ORDER — OXYCODONE HCL ER 10 MG PO T12A
10.0000 mg | EXTENDED_RELEASE_TABLET | Freq: Two times a day (BID) | ORAL | Status: DC
  Administered 2018-02-14 – 2018-02-15 (×3): 10 mg via ORAL
  Filled 2018-02-14 (×3): qty 1

## 2018-02-14 MED ORDER — SODIUM CHLORIDE 0.9% FLUSH
3.0000 mL | Freq: Two times a day (BID) | INTRAVENOUS | Status: DC
  Administered 2018-02-14 – 2018-02-15 (×4): 3 mL via INTRAVENOUS

## 2018-02-14 MED ORDER — OXYCODONE HCL 5 MG PO TABS
5.0000 mg | ORAL_TABLET | Freq: Once | ORAL | Status: AC
  Administered 2018-02-14: 5 mg via ORAL
  Filled 2018-02-14 (×2): qty 1

## 2018-02-14 MED ORDER — LISINOPRIL 10 MG PO TABS
20.0000 mg | ORAL_TABLET | Freq: Every day | ORAL | Status: DC
  Administered 2018-02-15: 20 mg via ORAL
  Filled 2018-02-14 (×2): qty 2

## 2018-02-14 MED ORDER — FUROSEMIDE 10 MG/ML IJ SOLN
40.0000 mg | Freq: Two times a day (BID) | INTRAMUSCULAR | Status: DC
  Administered 2018-02-14 – 2018-02-15 (×3): 40 mg via INTRAVENOUS
  Filled 2018-02-14 (×3): qty 4

## 2018-02-14 MED ORDER — ONDANSETRON HCL 4 MG/2ML IJ SOLN: 4.0000 mg | Freq: Four times a day (QID) | INTRAMUSCULAR | Status: DC | PRN

## 2018-02-14 MED ORDER — SODIUM CHLORIDE 0.9% FLUSH: 3.0000 mL | INTRAVENOUS | Status: DC | PRN

## 2018-02-14 MED ORDER — SODIUM CHLORIDE 0.9 % IV SOLN: 250.0000 mL | INTRAVENOUS | Status: DC | PRN

## 2018-02-14 MED ORDER — ACETAMINOPHEN 325 MG PO TABS
650.0000 mg | ORAL_TABLET | ORAL | Status: DC | PRN
  Administered 2018-02-14: 650 mg via ORAL
  Filled 2018-02-14: qty 2

## 2018-02-14 NOTE — Care Management Obs Status (Signed)
[facility] NOTIFICATION   Patient Details  Name: Kenneth Rhodes MRN: [id number] Date of Birth: November 10, 1945   Medicare Observation Status Notification Given:  Yes    Sherald Barge, RN 02/14/2018, 11:04 AM

## 2018-02-14 NOTE — ED Notes (Signed)
Bladder scan showing 147 ml's . Patient had 300 ml' of urine output just prior to bladder scan.

## 2018-02-14 NOTE — H&P (Signed)
History and Physical    Saathvik Every Mccrae AJO:[id number] DOB: 1945-05-16 DOA: 02/14/2018  PCP: [facility]   Patient coming from: Home   Chief Complaint: SOB, leg swelling, weight gain, chest tightness   HPI: THIERNO HUN is a 72 y.o. male with medical history significant for hypothyroidism, hypertension, chronic low back pain with history of opiate addiction, now presenting to the emergency department for evaluation of leg swelling, shortness of breath, weight gain, and chest tightness.  Patient reports that he been in his usual state of health until the insidious development of worsening shortness of breath and bilateral lower extremity swelling.  Symptoms began to limit his activity over the past week and he was seen in the ED for this a couple days ago, started on Lasix, but reports no significant improvement in his symptoms and no apparent change in his urine output.  He reports an occasional nonproductive cough.  Denies fevers or chills.  Denies chest pain per se, but reports a constant "tightness" across the central chest for more than a week now without any appreciable alleviating or exacerbating factors.  States that he has been out of his Synthroid for "sometime," and is not sure about his other medications, but believes he is still taking lisinopril daily.  He had an echocardiogram in 2009 with preserved EF and normal diastolic function.  ED Course: Upon arrival to the ED, patient is found to be saturating 90% on room air, and with vitals otherwise normal.  EKG features a sinus rhythm and chest x-ray is negative for acute cardiopulmonary disease.  Chemistry panel is notable for a sodium of 132 and CBC features a mild normocytic anemia.  Troponin is undetectable and BNP is normal.  Patient was given 324 mg of aspirin, 40 mg IV Lasix, and oxycodone in the ED.  He has begun to diurese, remains hemodynamically stable, and will be observed in the hospital for ongoing  evaluation and management of weight gain with leg swelling and worsening shortness of breath suspected secondary to new CHF.  Review of Systems:  All other systems reviewed and apart from HPI, are negative.  Past Medical History:  Diagnosis Date  . Arthritis   . Bladder spasms   . Blood transfusion without reported diagnosis   . BPH (benign prostatic hypertrophy)   . C. difficile diarrhea   . Chronic hepatitis C ([place])   . Depression   . GERD (gastroesophageal reflux disease)   . Hepatitis C   . Hyperlipidemia   . Hypertension   . Hypothyroidism   . Liver disease   . MRSA (methicillin resistant staph aureus) culture positive   . Neurogenic bladder   . Obesity   . Sleep apnea   . Substance abuse ([place])    pain medication    Past Surgical History:  Procedure Laterality Date  . COLONOSCOPY     ovr 10 yrs ago in [place]. pt said normal exam  . [place] SURGERY     2008, 2015, 2017  . LUMBAR EPIDURAL INJECTION    . MEDIAL PARTIAL KNEE REPLACEMENT Right   . SPINE SURGERY     due to mrsa  . SUPRAPUBIC CATHETER INSERTION       reports that he quit smoking about 23 years ago. His smoking use included cigarettes. He has never used smokeless tobacco. He reports that he does not drink alcohol or use drugs.  Allergies  Allergen Reactions  . Hydrocodone Nausea Only    Family History  Problem Relation Age of Onset  . Ovarian cancer Mother   . Colon cancer Mother        dx age 61  . AAA (abdominal aortic aneurysm) Father   . Esophageal cancer Neg Hx   . Kidney disease Neg Hx   . Liver disease Neg Hx      Prior to Admission medications   Medication Sig Start Date End Date Taking? Authorizing Provider  acetaminophen (TYLENOL) 325 MG tablet Take 650 mg by mouth every 4 (four) hours as needed.     [provider]  furosemide (LASIX) 20 MG tablet Take 1 tablet (20 mg total) by mouth 2 (two) times daily. 02/12/18   Virgel Manifold, MD  levothyroxine (SYNTHROID,  LEVOTHROID) 125 MCG tablet TAKE ONE TABLET EVERY MORNING BEFORE BREAKFAST 04/26/16   Wardell Honour, MD  lisinopril-hydrochlorothiazide (PRINZIDE,ZESTORETIC) 20-12.5 MG tablet Take by mouth. 01/18/17   [provider]  omeprazole (PRILOSEC) 40 MG capsule Take 40 mg by mouth 2 (two) times daily.    [provider]  oxyCODONE ER (XTAMPZA ER) 13.5 MG C12A Take by mouth. 01/09/18   [provider]  potassium chloride (K-DUR) 10 MEQ tablet Take 1 tablet (10 mEq total) by mouth 2 (two) times daily. 02/12/18   Virgel Manifold, MD  Probiotic Product (PROBIOTIC FORMULA PO) Take 1 capsule by mouth daily.    [provider]  sulfamethoxazole-trimethoprim (BACTRIM,SEPTRA) 400-80 MG tablet Take 1 tablet by mouth 2 (two) times daily. 12/16/17   [provider]    Physical Exam: Vitals:   02/14/18 0010 02/14/18 0046  BP:  130/75  Pulse:  89  Resp:  18  SpO2:  96%  Weight: 127 kg   Height: 6' (1.829 m)     Constitutional: NAD, calm  Eyes: PERTLA, lids and conjunctivae normal ENMT: Mucous membranes are moist. Posterior pharynx clear of any exudate or lesions.   Neck: normal, supple, no masses, no thyromegaly Respiratory: Mild dyspnea with speech, no wheezing, no crackles. Normal respiratory effort.    Cardiovascular: S1 & S2 heard, regular rate and rhythm. Pretibial pitting edema bilaterally. Abdomen: No distension, no tenderness, no masses palpated. Bowel sounds normal.  Musculoskeletal: no clubbing / cyanosis. No joint deformity upper and lower extremities.    Skin: no significant rashes, lesions, ulcers. Warm, dry, well-perfused. Neurologic: CN 2-12 grossly intact. Sensation intact. Strength 5/5 in all 4 limbs.  Psychiatric: Alert and oriented x 3. Calm, cooperative.    Labs on Admission: I have personally reviewed following labs and imaging studies  CBC: Recent Labs  Lab 02/12/18 1343 02/14/18 0018  WBC 5.6 7.6  NEUTROABS 3.6 5.2  HGB 11.7*  11.8*  HCT 37.4* 37.1*  MCV 91.4 86.7  PLT 168 144   Basic Metabolic Panel: Recent Labs  Lab 02/12/18 1343 02/14/18 0018  NA 137 132*  K 3.7 3.7  CL 103 96*  CO2 24 26  GLUCOSE 86 138*  BUN 12 14  CREATININE 0.97 1.16  CALCIUM 9.1 9.1   GFR: Estimated Creatinine Clearance: 81.6 mL/min (by C-G formula based on SCr of 1.16 mg/dL). Liver Function Tests: Recent Labs  Lab 02/14/18 0018  AST 19  ALT 21  ALKPHOS 81  BILITOT 0.9  PROT 7.6  ALBUMIN 4.4   No results for input(s): LIPASE, AMYLASE in the last 168 hours. No results for input(s): AMMONIA in the last 168 hours. Coagulation Profile: No results for input(s): INR, PROTIME in the last 168 hours. Cardiac Enzymes: Recent Labs  Lab 02/12/18 1343 02/14/18 0018  TROPONINI <0.03 <0.03   BNP (last 3 results) No results for input(s): PROBNP in the last 8760 hours. HbA1C: No results for input(s): HGBA1C in the last 72 hours. CBG: No results for input(s): GLUCAP in the last 168 hours. Lipid Profile: No results for input(s): CHOL, HDL, LDLCALC, TRIG, CHOLHDL, LDLDIRECT in the last 72 hours. Thyroid Function Tests: No results for input(s): TSH, T4TOTAL, FREET4, T3FREE, THYROIDAB in the last 72 hours. Anemia Panel: No results for input(s): VITAMINB12, FOLATE, FERRITIN, TIBC, IRON, RETICCTPCT in the last 72 hours. Urine analysis:    Component Value Date/Time   COLORURINE YELLOW 06/22/2015 1400   APPEARANCEUR CLEAR 06/22/2015 1400   LABSPEC 1.010 06/22/2015 1400   PHURINE 8.0 06/22/2015 1400   GLUCOSEU NEGATIVE 06/22/2015 1400   HGBUR NEGATIVE 06/22/2015 1400   BILIRUBINUR NEGATIVE 06/22/2015 1400   BILIRUBINUR neg 03/18/2015 1159   KETONESUR NEGATIVE 06/22/2015 1400   PROTEINUR NEGATIVE 06/22/2015 1400   UROBILINOGEN negative 03/18/2015 1159   UROBILINOGEN 0.2 01/14/2015 1900   NITRITE NEGATIVE 06/22/2015 1400   LEUKOCYTESUR TRACE (A) 06/22/2015 1400   Sepsis  Labs: @LABRCNTIP (procalcitonin:4,lacticidven:4) )No results found for this or any previous visit (from the past 240 hour(s)).   Radiological Exams on Admission: Dg Chest 2 View  Result Date: 02/14/2018 CLINICAL DATA:  Shortness of breath and chest pain for several days. EXAM: CHEST - 2 VIEW COMPARISON:  02/12/2018 FINDINGS: The heart size and mediastinal contours are within normal limits. Both lungs are clear. Thoracolumbar spine fusion hardware again noted. IMPRESSION: No active cardiopulmonary disease. Electronically Signed   By: Earle Gell M.D.   On: 02/14/2018 01:24   Dg Chest 2 View  Result Date: 02/12/2018 CLINICAL DATA:  Initial evaluation for acute dyspnea, chest pain. EXAM: CHEST - 2 VIEW COMPARISON:  Prior radiograph from 11/26/2014. FINDINGS: Mild cardiomegaly.  Mediastinal silhouette normal. Lungs mildly hypoinflated with elevation of the left hemidiaphragm, similar to previous. Mild diffuse pulmonary vascular congestion without overt pulmonary edema. No focal infiltrates. No pleural effusion. No pneumothorax. No acute osseus abnormality. Thoracolumbar fixation partially visualized. IMPRESSION: 1. Cardiomegaly with mild diffuse pulmonary vascular congestion without overt pulmonary edema. 2. No other active cardiopulmonary disease. Electronically Signed   By: Jeannine Boga M.D.   On: 02/12/2018 13:32    EKG: Independently reviewed. Sinus rhythm.   Assessment/Plan   1. Acute CHF  - Presents with wt gain, increased b/l leg swelling, SOB, and chest tightness  - Suspected to have new CHF  - Normal EF and diastolic dysfunction on remote echocardiogram  - Treated with Lasix 40 mg IV in ED and has begun to diurese well  - Continue diuresis with Lasix 40 mg IV q12h, update echocardiogram, continue ACE-i    2. Hypertension  - BP at goal  - Continue lisinopril    3. Hypothyroidism  - Does not believe he has been taking Synthroid anymore  - Check TSH    4. Chronic pain  -  No pain complaints on admission  - Pharmacy working on med rec, will plan to continue home regimen    5. Hyponatremia  - Serum sodium is 132 on admission, possibly d/t CHF though was normal in ED two days earlier  - Check urine sodium and urine osm, check TSH as above, repeat chem panel in am     DVT prophylaxis: Lovenox Code Status: Full  Family Communication: Discussed with patient  Consults called: None Admission status: Observation     Christia Reading  Criss Rosales, MD [facility] Pager [phone]  If 7PM-7AM, please contact night-coverage [email or web address] Password [email or web address]  02/14/2018, 2:41 AM

## 2018-02-14 NOTE — ED Triage Notes (Signed)
Pt c/o chest pain that feels like chest congestion/tightness, pt reports he was seen here for same  2 days ago and has taken 4 doses of Lasix without improvement

## 2018-02-14 NOTE — ED Notes (Signed)
Patient able to ambulate with assistance of his cane. Patient had no problems ambulating and oxygen sats remained 94 to 95 percent on room air. Patient stated no breathing problems. Patient's pulse rate remained at 85 to 97 percent. Patient stated his chest pain had improved and that he feels a lot better.

## 2018-02-14 NOTE — ED Provider Notes (Signed)
[facility] EMERGENCY DEPARTMENT Provider Note   CSN: [id number] Arrival date & time: 02/14/18  0002     History   Chief Complaint Chief Complaint  Patient presents with  . Chest Pain    HPI Kenneth Rhodes is a 72 y.o. male.  Patient presents with central chest "tightness" with shortness of breath, leg swelling, weight gain and "wheezing".  He was seen 2 days ago for similar symptoms and given Lasix which she states compliance with.  He states he has not had much urine output.  He catheterizes himself at baseline.  He states he urinated a significant amount after receiving IV Lasix in the ED and his chest tightness resolved but quickly returned again when he went home.  He describes constant tightness in the center of his chest that does not radiate.  Nothing makes it better or worse.  Pain is not exertional or pleuritic.  States he does feel short of breath that is worse with exertion.  Complains of leg swelling and reports 20 pound weight gain in the past week.  Denies any history of CHF or CAD.  States his doctor told him he had an MI in the past based on EKG several months ago.  States he feels like he needs to cough but nothing comes up.  No fever, runny nose, sore throat.  He does not lie flat at baseline due to back pain but feels his orthopnea is somewhat worse than usual.  The history is provided by the patient.  Chest Pain   Associated symptoms include shortness of breath. Pertinent negatives include no abdominal pain, no cough, no dizziness, no fever, no headaches, no nausea, no vomiting and no weakness.    Past Medical History:  Diagnosis Date  . Arthritis   . Bladder spasms   . Blood transfusion without reported diagnosis   . BPH (benign prostatic hypertrophy)   . C. difficile diarrhea   . Chronic hepatitis C ([place])   . Depression   . GERD (gastroesophageal reflux disease)   . Hepatitis C   . Hyperlipidemia   . Hypertension   . Hypothyroidism   . Liver  disease   . MRSA (methicillin resistant staph aureus) culture positive   . Neurogenic bladder   . Obesity   . Sleep apnea   . Substance abuse ([place])    pain medication    Patient Active Problem List   Diagnosis Date Noted  . GAD (generalized anxiety disorder) 01/19/2015  . Lower abdominal pain 10/22/2014  . Diarrhea 10/22/2014  . Hepatitis C 10/17/2014  . Testosterone deficiency 06/09/2014  . Vitamin D deficiency 06/09/2014  . Opiate addiction ([place]) 03/26/2014  . Opiate withdrawal ([place]) 03/26/2014  . Depression, major, recurrent ([place]) 03/26/2014  . Suicidal ideation 03/26/2014  . Chronic pain 03/26/2014  . Lumbar radiculopathy, chronic 03/26/2014  . Hypothyroidism 03/26/2014  . BPH (benign prostatic hyperplasia) 03/26/2014  . Bladder spasm 03/26/2014  . Opioid dependence with opioid-induced mood disorder [facility])     Past Surgical History:  Procedure Laterality Date  . COLONOSCOPY     ovr 10 yrs ago in [place]. pt said normal exam  . [place] SURGERY     2008, 2015, 2017  . LUMBAR EPIDURAL INJECTION    . MEDIAL PARTIAL KNEE REPLACEMENT Right   . SPINE SURGERY     due to mrsa  . SUPRAPUBIC CATHETER INSERTION          Home Medications    Prior to Admission medications  Medication Sig Start Date End Date Taking? Authorizing Provider  acetaminophen (TYLENOL) 325 MG tablet Take 650 mg by mouth every 4 (four) hours as needed.     [provider]  furosemide (LASIX) 20 MG tablet Take 1 tablet (20 mg total) by mouth 2 (two) times daily. 02/12/18   Virgel Manifold, MD  levothyroxine (SYNTHROID, LEVOTHROID) 125 MCG tablet TAKE ONE TABLET EVERY MORNING BEFORE BREAKFAST 04/26/16   Wardell Honour, MD  lisinopril-hydrochlorothiazide (PRINZIDE,ZESTORETIC) 20-12.5 MG tablet Take by mouth. 01/18/17   [provider]  omeprazole (PRILOSEC) 40 MG capsule Take 40 mg by mouth 2 (two) times daily.    [provider]  oxyCODONE ER (XTAMPZA ER) 13.5 MG C12A  Take by mouth. 01/09/18   [provider]  potassium chloride (K-DUR) 10 MEQ tablet Take 1 tablet (10 mEq total) by mouth 2 (two) times daily. 02/12/18   Virgel Manifold, MD  Probiotic Product (PROBIOTIC FORMULA PO) Take 1 capsule by mouth daily.    [provider]  sulfamethoxazole-trimethoprim (BACTRIM,SEPTRA) 400-80 MG tablet Take 1 tablet by mouth 2 (two) times daily. 12/16/17   [provider]    Family History Family History  Problem Relation Age of Onset  . Ovarian cancer Mother   . Colon cancer Mother        dx age 65  . AAA (abdominal aortic aneurysm) Father   . Esophageal cancer Neg Hx   . Kidney disease Neg Hx   . Liver disease Neg Hx     Social History Social History   Tobacco Use  . Smoking status: Former Smoker    Types: Cigarettes    Last attempt to quit: 03/21/1994    Years since quitting: 23.9  . Smokeless tobacco: Never Used  Substance Use Topics  . Alcohol use: No    Alcohol/week: 0.0 standard drinks  . Drug use: No    Comment: heroin- pulled forward from previoius visit, pt denies drug use.     Allergies   Hydrocodone   Review of Systems Review of Systems  Constitutional: Positive for appetite change. Negative for fatigue and fever.  HENT: Negative for congestion.   Eyes: Negative for visual disturbance.  Respiratory: Positive for chest tightness, shortness of breath and wheezing. Negative for cough.   Cardiovascular: Positive for chest pain.  Gastrointestinal: Negative for abdominal pain, nausea and vomiting.  Genitourinary: Negative for dysuria, hematuria and testicular pain.  Musculoskeletal: Negative for arthralgias and myalgias.  Skin: Negative for rash.  Neurological: Negative for dizziness, weakness, light-headedness and headaches.   all other systems are negative except as noted in the HPI and PMH.     Physical Exam Updated Vital Signs BP 103/74 (BP Location: Right Arm)   Pulse 79   Resp 10   Ht 6' (1.829  m)   Wt 127 kg   SpO2 96%   BMI 37.97 kg/m   Physical Exam  Constitutional: He is oriented to person, place, and time. He appears well-developed and well-nourished. No distress.  Obese, no distress, speaking in full sentences  HENT:  Head: Normocephalic and atraumatic.  Mouth/Throat: Oropharynx is clear and moist. No oropharyngeal exudate.  Eyes: Pupils are equal, round, and reactive to light. Conjunctivae and EOM are normal.  Neck: Normal range of motion. Neck supple.  No meningismus.  Cardiovascular: Normal rate, regular rhythm, normal heart sounds and intact distal pulses.  No murmur heard. Pulmonary/Chest: Effort normal and breath sounds normal. No respiratory distress. He exhibits no tenderness.  Diminished,  fine crackles at the bases  Abdominal: Soft. There is no tenderness. There is no rebound and no guarding.  Musculoskeletal: Normal range of motion. He exhibits edema. He exhibits no tenderness.  +2 edema to knees bilaterally, intact DP and PT pulses  Neurological: He is alert and oriented to person, place, and time. No cranial nerve deficit. He exhibits normal muscle tone. Coordination normal.   5/5 strength throughout. CN 2-12 intact.Equal grip strength.   Skin: Skin is warm.  Psychiatric: He has a normal mood and affect. His behavior is normal.  Nursing note and vitals reviewed.    ED Treatments / Results  Labs (all labs ordered are listed, but only abnormal results are displayed) Labs Reviewed  CBC WITH DIFFERENTIAL/PLATELET - Abnormal; Notable for the following components:      Result Value   Hemoglobin 11.8 (*)    HCT 37.1 (*)    All other components within normal limits  COMPREHENSIVE METABOLIC PANEL - Abnormal; Notable for the following components:   Sodium 132 (*)    Chloride 96 (*)    Glucose, Bld 138 (*)    All other components within normal limits  TROPONIN I  BRAIN NATRIURETIC PEPTIDE  HIV ANTIBODY (ROUTINE TESTING W REFLEX)  TSH  SODIUM, URINE,  RANDOM  OSMOLALITY, URINE    EKG EKG Interpretation  Date/Time:  Wednesday February 14 2018 00:26:18 EST Ventricular Rate:  90 PR Interval:    QRS Duration: 86 QT Interval:  337 QTC Calculation: 413 R Axis:   81 Text Interpretation:  Sinus rhythm Inferior infarct, old Q waves new since 02/12/18 Confirmed by Ezequiel Essex [phone]) on 02/14/2018 12:32:04 AM   Radiology Dg Chest 2 View  Result Date: 02/12/2018 CLINICAL DATA:  Initial evaluation for acute dyspnea, chest pain. EXAM: CHEST - 2 VIEW COMPARISON:  Prior radiograph from 11/26/2014. FINDINGS: Mild cardiomegaly.  Mediastinal silhouette normal. Lungs mildly hypoinflated with elevation of the left hemidiaphragm, similar to previous. Mild diffuse pulmonary vascular congestion without overt pulmonary edema. No focal infiltrates. No pleural effusion. No pneumothorax. No acute osseus abnormality. Thoracolumbar fixation partially visualized. IMPRESSION: 1. Cardiomegaly with mild diffuse pulmonary vascular congestion without overt pulmonary edema. 2. No other active cardiopulmonary disease. Electronically Signed   By: Jeannine Boga M.D.   On: 02/12/2018 13:32    Procedures Procedures (including critical care time)  Medications Ordered in ED Medications - No data to display   Initial Impression / Assessment and Plan / ED Course  I have reviewed the triage vital signs and the nursing notes.  Pertinent labs & imaging results that were available during my care of the patient were reviewed by me and considered in my medical decision making (see chart for details).    Patient returns with recurrent chest "tightness" as well as shortness of breath, PND, leg swelling and orthopnea.  Reports ongoing chest tightness all day. Not exertional or pleuritic. EKG shows inferior lateral Q waves that are more pronounced than 2 days ago.  Bladder scan with 147 mL after patient catheterized for 300 cc.  IV lasix given.  Checks x-ray  appears improved from 2 days ago with resolution of interstitial edema. Patient had additional 300 cc of urine output after IV Lasix.  He is able to ambulate without hypoxia or dyspnea. States his chest congestion and tightness has resolved.  Troponin negative.  Reports his chest tightness has resolved.  He is ambulatory without desaturation.  He is diuresing well after IV Lasix.  EKG does show new  Q waves compared to 2 days ago but troponin remains negative in setting of several days of ongoing "tightness".  Patient with mild dyspnea and feels that he was not improving at home on p.o. Lasix.  He is agreeable to observation admission for IV diuresis and updating his echocardiogram and further evaluation of abnormal EKG.  Discussed with Dr. Myna Hidalgo. Final Clinical Impressions(s) / ED Diagnoses   Final diagnoses:  Acute on chronic congestive heart failure, unspecified heart failure type [facility])    ED Discharge Orders    None       Ezequiel Essex, MD 02/14/18 [phone]

## 2018-02-14 NOTE — Progress Notes (Signed)
PROGRESS NOTE                                                                                                                                                                                                             Patient Demographics:    Kenneth Rhodes, is a 72 y.o. male, DOB - [birth year], WLS:[id number]  Admit date - 02/14/2018   Admitting Physician Vianne Bulls, MD  Outpatient Primary MD for the patient is [facility]  LOS - 0  Outpatient Specialists: None  Chief Complaint  Patient presents with  . Chest Pain       Brief Narrative Please refer to admission H&P for early this morning, 72 year old male with hypothyroidism, hypertension, chronic low back pain with ongoing opiate use presented to the ED with bilateral leg swelling, increasing shortness of breath and weight gain with chest tightness.  Symptoms ongoing for 1-2 weeks, was seen in the ED few days prior to admission and started on Lasix without improvement.  Patient placed on observation for acute CHF, likely diastolic.   Subjective:   Patient reports that he still has some dyspnea on attempting to get out of bed and leg swellings.   Assessment  & Plan :    Principal Problem:   Acute CHF (congestive heart failure) (HCC) Possibly diastolic.  No triggering symptoms including recent illness.  No new change in his meds except for being started on Lasix few days back in the ED. Continue IV Lasix 40 mg twice daily.  Monitor strict I/O and daily weight. Check 2D echo.  TSH normal.   Active Problems: Hyponatremia Mild.  Possibly hypervolemic.  Monitor with diuresis.  Urine sodium normal.   Hypothyroidism  continue Synthroid.  TSH normal.  Essential hypertension Stable.  Continue home meds  Chronic back pain Reports multiple surgeries.  Is on xtampza ER 13.5 mg bid at home, place on oxyER.  Obesity (BMI 37.97 kg/m)    Code  Status : Full code  Family Communication  : None at bedside  Disposition Plan  : Home possibly in 1-2 days once diuresed adequately  Barriers For Discharge : Active symptoms  Consults  : None  Procedures  : 2D echo  DVT Prophylaxis  : Lovenox  Lab Results  Component Value Date   PLT 213 02/14/2018    Antibiotics  :  Anti-infectives (From admission, onward)   None        Objective:   Vitals:   02/14/18 0256 02/14/18 0356 02/14/18 0530 02/14/18 0917  BP: 103/74 104/77  (!) 94/52  Pulse: 79 77  72  Resp: 10 18    Temp:  98.3 F (36.8 C)    TempSrc:  Oral    SpO2: 96% 93%    Weight:   127 kg   Height:   6' (1.829 m)     Wt Readings from Last 3 Encounters:  02/14/18 127 kg  02/12/18 127 kg  04/13/16 127.5 kg     Intake/Output Summary (Last 24 hours) at 02/14/2018 1241 Last data filed at 02/14/2018 0930 Gross per 24 hour  Intake 3 ml  Output 1050 ml  Net -1047 ml     Physical Exam  Gen: not in distress HEENT: no pallor, moist mucosa, supple neck, no JVD Chest: Diminished bilateral breath sounds CVS: N S1&S2, no murmurs, rubs or gallop GI: soft, NT, ND, BS+ Musculoskeletal: warm, 1+ pitting edema bilaterally     Data Review:    CBC Recent Labs  Lab 02/12/18 1343 02/14/18 0018  WBC 5.6 7.6  HGB 11.7* 11.8*  HCT 37.4* 37.1*  PLT 168 213  MCV 91.4 86.7  MCH 28.6 27.6  MCHC 31.3 31.8  RDW 13.1 13.1  LYMPHSABS 1.0 1.4  MONOABS 0.4 0.7  EOSABS 0.5 0.3  BASOSABS 0.1 0.0    Chemistries  Recent Labs  Lab 02/12/18 1343 02/14/18 0018  NA 137 132*  K 3.7 3.7  CL 103 96*  CO2 24 26  GLUCOSE 86 138*  BUN 12 14  CREATININE 0.97 1.16  CALCIUM 9.1 9.1  AST  --  19  ALT  --  21  ALKPHOS  --  81  BILITOT  --  0.9   ------------------------------------------------------------------------------------------------------------------ No results for input(s): CHOL, HDL, LDLCALC, TRIG, CHOLHDL, LDLDIRECT in the last 72 hours.  No results  found for: HGBA1C ------------------------------------------------------------------------------------------------------------------ Recent Labs    02/14/18 0534  TSH 3.582   ------------------------------------------------------------------------------------------------------------------ No results for input(s): VITAMINB12, FOLATE, FERRITIN, TIBC, IRON, RETICCTPCT in the last 72 hours.  Coagulation profile No results for input(s): INR, PROTIME in the last 168 hours.  No results for input(s): DDIMER in the last 72 hours.  Cardiac Enzymes Recent Labs  Lab 02/12/18 1343 02/14/18 0018  TROPONINI <0.03 <0.03   ------------------------------------------------------------------------------------------------------------------    Component Value Date/Time   BNP 36.0 02/14/2018 0018    Inpatient Medications  Scheduled Meds: . enoxaparin (LOVENOX) injection  40 mg Subcutaneous Q24H  . furosemide  40 mg Intravenous Q12H  . lisinopril  20 mg Oral Daily  . sodium chloride flush  3 mL Intravenous Q12H   Continuous Infusions: . sodium chloride     PRN Meds:.sodium chloride, acetaminophen, ondansetron (ZOFRAN) IV, sodium chloride flush  Micro Results No results found for this or any previous visit (from the past 240 hour(s)).  Radiology Reports Dg Chest 2 View  Result Date: 02/14/2018 CLINICAL DATA:  Shortness of breath and chest pain for several days. EXAM: CHEST - 2 VIEW COMPARISON:  02/12/2018 FINDINGS: The heart size and mediastinal contours are within normal limits. Both lungs are clear. Thoracolumbar spine fusion hardware again noted. IMPRESSION: No active cardiopulmonary disease. Electronically Signed   By: Earle Gell M.D.   On: 02/14/2018 01:24   Dg Chest 2 View  Result Date: 02/12/2018 CLINICAL DATA:  Initial evaluation for acute dyspnea, chest pain. EXAM: CHEST -  2 VIEW COMPARISON:  Prior radiograph from 11/26/2014. FINDINGS: Mild cardiomegaly.  Mediastinal silhouette  normal. Lungs mildly hypoinflated with elevation of the left hemidiaphragm, similar to previous. Mild diffuse pulmonary vascular congestion without overt pulmonary edema. No focal infiltrates. No pleural effusion. No pneumothorax. No acute osseus abnormality. Thoracolumbar fixation partially visualized. IMPRESSION: 1. Cardiomegaly with mild diffuse pulmonary vascular congestion without overt pulmonary edema. 2. No other active cardiopulmonary disease. Electronically Signed   By: Jeannine Boga M.D.   On: 02/12/2018 13:32    Time Spent in minutes 15   Millisa Giarrusso M.D on 02/14/2018 at 12:41 PM  Between 7am to 7pm - Pager - [phone]  After 7pm go to [email or web address] - password [facility]  [facility] -  Office  [phone]

## 2018-02-14 NOTE — Care Management Note (Addendum)
Case Management Note  Patient Details  Name: Kenneth Rhodes MRN: [id number] Date of Birth: 11-03-1945  Subjective/Objective:       Admitted with acute CHF. Pt from home, ind with ADL's. Has insurance and PCP. Has never had "heart problems" before. Follows to specific diet. Does not have a scale but could get one. Pt admits to medication non-compliance but does have a good understanding of what he is supposed to do/take. Pt communicates no needs or concerns regarding DC.             Action/Plan: DC home with self care.   Expected Discharge Date:  02/14/18               Expected Discharge Plan:  Home/Self Care  In-House Referral:  NA  Discharge planning Services  CM Consult  Post Acute Care Choice:  NA Choice offered to:  NA  Status of Service:  Completed, signed off  If discussed at Long Length of Stay Meetings, dates discussed:    Additional Comments:  Sherald Barge, RN 02/14/2018, 11:01 AM

## 2018-02-14 NOTE — Progress Notes (Signed)
*  PRELIMINARY RESULTS* Echocardiogram 2D Echocardiogram has been performed.  Kenneth Rhodes 02/14/2018, 11:08 AM

## 2018-02-15 DIAGNOSIS — E876 Hypokalemia: Secondary | ICD-10-CM

## 2018-02-15 DIAGNOSIS — I509 Heart failure, unspecified: Secondary | ICD-10-CM | POA: Diagnosis not present

## 2018-02-15 DIAGNOSIS — I5031 Acute diastolic (congestive) heart failure: Secondary | ICD-10-CM | POA: Diagnosis not present

## 2018-02-15 DIAGNOSIS — I10 Essential (primary) hypertension: Secondary | ICD-10-CM | POA: Diagnosis not present

## 2018-02-15 LAB — BASIC METABOLIC PANEL
ANION GAP: 9 (ref 5–15)
BUN: 14 mg/dL (ref 8–23)
CALCIUM: 9 mg/dL (ref 8.9–10.3)
CO2: 29 mmol/L (ref 22–32)
Chloride: 100 mmol/L (ref 98–111)
Creatinine, Ser: 1.03 mg/dL (ref 0.61–1.24)
GLUCOSE: 90 mg/dL (ref 70–99)
POTASSIUM: 3.4 mmol/L — AB (ref 3.5–5.1)
SODIUM: 138 mmol/L (ref 135–145)

## 2018-02-15 LAB — HIV ANTIBODY (ROUTINE TESTING W REFLEX): HIV SCREEN 4TH GENERATION: NONREACTIVE

## 2018-02-15 MED ORDER — LISINOPRIL 20 MG PO TABS: 20.0000 mg | ORAL_TABLET | Freq: Every day | ORAL | 11 refills | Status: DC

## 2018-02-15 MED ORDER — POTASSIUM CHLORIDE CRYS ER 20 MEQ PO TBCR: 40.0000 meq | EXTENDED_RELEASE_TABLET | Freq: Two times a day (BID) | ORAL | Status: DC

## 2018-02-15 MED ORDER — FUROSEMIDE 40 MG PO TABS: ORAL_TABLET | ORAL | 0 refills | Status: DC

## 2018-02-15 MED ORDER — POTASSIUM CHLORIDE CRYS ER 20 MEQ PO TBCR
40.0000 meq | EXTENDED_RELEASE_TABLET | Freq: Once | ORAL | Status: AC
  Administered 2018-02-15: 40 meq via ORAL
  Filled 2018-02-15: qty 2

## 2018-02-15 NOTE — Discharge Summary (Signed)
Physician Discharge Summary  Kenneth Rhodes JJH:[id number] DOB: 1945/06/18 DOA: 02/14/2018  PCP: [facility]  Admit date: 02/14/2018 Discharge date: 02/15/2018  Admitted From: Home Disposition: home  Recommendations for Outpatient Follow-up:  1. Follow up with PCP in 1-2 weeks 2. Please obtain BMP and mag level in one week  Home Health: None Equipment/Devices: None  Discharge Condition: Stable CODE STATUS: Full code Diet recommendation: Low-salt cardiac  HPI: Per Dr. Myna Hidalgo HPI: Kenneth Rhodes is a 72 y.o. male with medical history significant for hypothyroidism, hypertension, chronic low back pain with history of opiate addiction, now presenting to the emergency department for evaluation of leg swelling, shortness of breath, weight gain, and chest tightness.  Patient reports that he been in his usual state of health until the insidious development of worsening shortness of breath and bilateral lower extremity swelling.  Symptoms began to limit his activity over the past week and he was seen in the ED for this a couple days ago, started on Lasix, but reports no significant improvement in his symptoms and no apparent change in his urine output.  He reports an occasional nonproductive cough.  Denies fevers or chills.  Denies chest pain per se, but reports a constant "tightness" across the central chest for more than a week now without any appreciable alleviating or exacerbating factors.  States that he has been out of his Synthroid for "sometime," and is not sure about his other medications, but believes he is still taking lisinopril daily.  He had an echocardiogram in 2009 with preserved EF and normal diastolic function.  ED Course: Upon arrival to the ED, patient is found to be saturating 90% on room air, and with vitals otherwise normal.  EKG features a sinus rhythm and chest x-ray is negative for acute cardiopulmonary disease.  Chemistry panel is notable for a  sodium of 132 and CBC features a mild normocytic anemia.  Troponin is undetectable and BNP is normal.  Patient was given 324 mg of aspirin, 40 mg IV Lasix, and oxycodone in the ED.  He has begun to diurese, remains hemodynamically stable, and will be observed in the hospital for ongoing evaluation and management of weight gain with leg swelling and worsening shortness of breath suspected secondary to new CHF.  Hospital Course: Patient diuresed with IV Lasix with significant urine output and improvement in his symptoms. Serial troponin and EKG without acute ischemic finding.  Echocardiogram with EF of 55 to 60%, G1DD and no other functional or structural abnormalities. Patient discharged in stable condition on oral Lasix 40 mg daily.  He was advised to weigh himself daily and take additional dose of Lasix if his weight is up from the previous day by 2 to 3 pounds or if he notices dyspnea or significant leg swelling.  Dietary counseling provided.  Advised to avoid over-the-counter pain medicine except Tylenol.  Follow-up with PCP in 1 to 2 weeks.  Recommend repeat BMP and magnesium level at follow-up.  Hyponatremia resolved  Hypokalemia replenished  Other chronic medical conditions including hypothyroidism and hypertension stable.  Changed lisinopril/HCTZ to plain lisinopril as patient would be on Lasix for CHF.  Discharge Diagnoses:  Principal Problem:   Acute CHF (congestive heart failure) (HCC) Active Problems:   Chronic pain   Hypothyroidism   Hypertension   Hyponatremia   Discharge Instructions  Discharge Instructions    Call MD for:  difficulty breathing, headache or visual disturbances   Complete by:  As directed  Diet - low sodium heart healthy   Complete by:  As directed    Heart Failure patients record your daily weight using the same scale at the same time of day   Complete by:  As directed    Increase activity slowly   Complete by:  As directed      Allergies as of  02/15/2018      Reactions   Hydrocodone Nausea Only      Medication List    STOP taking these medications   lisinopril-hydrochlorothiazide 20-12.5 MG tablet Commonly known as:  PRINZIDE,ZESTORETIC     TAKE these medications   acetaminophen 325 MG tablet Commonly known as:  TYLENOL Take 650 mg by mouth every 4 (four) hours as needed.   furosemide 40 MG tablet Commonly known as:  LASIX Take 1 tablet 1-2 times a day as directed. What changed:    medication strength  how much to take  how to take this  when to take this  additional instructions   levothyroxine 125 MCG tablet Commonly known as:  SYNTHROID, LEVOTHROID TAKE ONE TABLET EVERY MORNING BEFORE BREAKFAST   lisinopril 20 MG tablet Commonly known as:  PRINIVIL,ZESTRIL Take 1 tablet (20 mg total) by mouth daily.   omeprazole 40 MG capsule Commonly known as:  PRILOSEC Take 40 mg by mouth 2 (two) times daily.   potassium chloride 10 MEQ tablet Commonly known as:  K-DUR Take 1 tablet (10 mEq total) by mouth 2 (two) times daily.   sulfamethoxazole-trimethoprim 400-80 MG tablet Commonly known as:  BACTRIM,SEPTRA Take 1 tablet by mouth 2 (two) times daily.   XTAMPZA ER 13.5 MG C12a Generic drug:  oxyCODONE ER Take by mouth.      Follow-up Information    [facility] Follow up in 1 week(s).   Contact information: [address] [place] [address] [phone]           Consultations:  None  Procedures/Studies: 2D echocardiogram  - Left ventricle: The cavity size was normal. Wall thickness was   increased in a pattern of mild LVH. Systolic function was normal.   The estimated ejection fraction was in the range of 55% to 60%.   Wall motion was normal; there were no regional wall motion   abnormalities. Doppler parameters are consistent with abnormal   left ventricular relaxation (grade 1 diastolic dysfunction). - Aortic valve: Mildly calcified annulus. Trileaflet. - Mitral valve:  There was trivial regurgitation. - Right atrium: Central venous pressure (est): 3 mm Hg. - Tricuspid valve: There was trivial regurgitation. - Pulmonary arteries: Systolic pressure could not be accurately   estimated. - Pericardium, extracardiac: A prominent pericardial fat pad was present  Dg Chest 2 View  Result Date: 02/14/2018 CLINICAL DATA:  Shortness of breath and chest pain for several days. EXAM: CHEST - 2 VIEW COMPARISON:  02/12/2018 FINDINGS: The heart size and mediastinal contours are within normal limits. Both lungs are clear. Thoracolumbar spine fusion hardware again noted. IMPRESSION: No active cardiopulmonary disease. Electronically Signed   By: Earle Gell M.D.   On: 02/14/2018 01:24   Dg Chest 2 View  Result Date: 02/12/2018 CLINICAL DATA:  Initial evaluation for acute dyspnea, chest pain. EXAM: CHEST - 2 VIEW COMPARISON:  Prior radiograph from 11/26/2014. FINDINGS: Mild cardiomegaly.  Mediastinal silhouette normal. Lungs mildly hypoinflated with elevation of the left hemidiaphragm, similar to previous. Mild diffuse pulmonary vascular congestion without overt pulmonary edema. No focal infiltrates. No pleural effusion. No pneumothorax. No acute osseus  abnormality. Thoracolumbar fixation partially visualized. IMPRESSION: 1. Cardiomegaly with mild diffuse pulmonary vascular congestion without overt pulmonary edema. 2. No other active cardiopulmonary disease. Electronically Signed   By: Jeannine Boga M.D.   On: 02/12/2018 13:32      Subjective: No complaints this morning.  Reports significant improvement in his breathing and leg swelling.  Denies dyspnea, chest pain, orthopnea or PND.  Ready to go home.  Discharge Exam: Vitals:   02/15/18 0642 02/15/18 0904  BP: 103/70 106/71  Pulse: 71 88  Resp: 20   Temp: 98.2 F (36.8 C)   SpO2: 93%    GENERAL: Appears well. No acute distress.  HEENT: MMM.  Vision and Hearing grossly intact.  NECK: Supple.  No JVD.  LUNGS:   No IWOB. Good air movement. CTAB.  HEART:  RRR. Heart sounds normal.  ABD: Bowel sounds present. Soft. Non tender.  EXT: Trace edema bilaterally SKIN: no apparent skin lesion.  NEURO: Awake, alert and oriented appropriately.  No gross deficit.  PSYCH: Calm. Normal affect.   The results of significant diagnostics from this hospitalization (including imaging, microbiology, ancillary and laboratory) are listed below for reference.     Microbiology: No results found for this or any previous visit (from the past 240 hour(s)).   Labs: BNP (last 3 results) Recent Labs    02/12/18 1343 02/14/18 0018  BNP 53.0 50.5   Basic Metabolic Panel: Recent Labs  Lab 02/12/18 1343 02/14/18 0018 02/15/18 0428  NA 137 132* 138  K 3.7 3.7 3.4*  CL 103 96* 100  CO2 24 26 29   GLUCOSE 86 138* 90  BUN 12 14 14   CREATININE 0.97 1.16 1.03  CALCIUM 9.1 9.1 9.0   Liver Function Tests: Recent Labs  Lab 02/14/18 0018  AST 19  ALT 21  ALKPHOS 81  BILITOT 0.9  PROT 7.6  ALBUMIN 4.4   No results for input(s): LIPASE, AMYLASE in the last 168 hours. No results for input(s): AMMONIA in the last 168 hours. CBC: Recent Labs  Lab 02/12/18 1343 02/14/18 0018  WBC 5.6 7.6  NEUTROABS 3.6 5.2  HGB 11.7* 11.8*  HCT 37.4* 37.1*  MCV 91.4 86.7  PLT 168 213   Cardiac Enzymes: Recent Labs  Lab 02/12/18 1343 02/14/18 0018  TROPONINI <0.03 <0.03   BNP: Invalid input(s): POCBNP CBG: No results for input(s): GLUCAP in the last 168 hours. D-Dimer No results for input(s): DDIMER in the last 72 hours. Hgb A1c No results for input(s): HGBA1C in the last 72 hours. Lipid Profile No results for input(s): CHOL, HDL, LDLCALC, TRIG, CHOLHDL, LDLDIRECT in the last 72 hours. Thyroid function studies Recent Labs    02/14/18 0534  TSH 3.582   Anemia work up No results for input(s): VITAMINB12, FOLATE, FERRITIN, TIBC, IRON, RETICCTPCT in the last 72 hours. Urinalysis    Component Value Date/Time     COLORURINE YELLOW 06/22/2015 1400   APPEARANCEUR CLEAR 06/22/2015 1400   LABSPEC 1.010 06/22/2015 1400   PHURINE 8.0 06/22/2015 1400   GLUCOSEU NEGATIVE 06/22/2015 1400   HGBUR NEGATIVE 06/22/2015 1400   BILIRUBINUR NEGATIVE 06/22/2015 1400   BILIRUBINUR neg 03/18/2015 1159   KETONESUR NEGATIVE 06/22/2015 1400   PROTEINUR NEGATIVE 06/22/2015 1400   UROBILINOGEN negative 03/18/2015 1159   UROBILINOGEN 0.2 01/14/2015 1900   NITRITE NEGATIVE 06/22/2015 1400   LEUKOCYTESUR TRACE (A) 06/22/2015 1400   Sepsis Labs Invalid input(s): PROCALCITONIN,  WBC,  LACTICIDVEN   Time coordinating discharge: 30 minutes  SIGNED:  Bretta Bang  Bettina Gavia, MD  [facility] 02/15/2018, 10:18 AM Pager [phone]  If 7PM-7AM, please contact night-coverage [email or web address] Password [email or web address]

## 2018-02-15 NOTE — Discharge Instructions (Addendum)
It has been a pleasure taking care of you! You were admitted due to leg swelling, shortness of breath and chest tightness, which was likely due to your heart failure and excess fluid. We have treated you with fluid medication (Lasix).  With that your symptoms improved to the point we think it is safe to let you go home and follow up with your primary care doctor. We are discharging you on more fluid pill (Lasix) and your home medications that you need to continue taking after you leave the hospital.   In regards to the fluid pill (Lasix), take 1 tablet every day.  You may take additional tablet in the afternoon if your weight is up by 2 to 3 pounds from the previous weight.  There could be some changes made to your home medications during this hospitalization. Please, make sure to read the directions before you take them. The names and directions on how to take these medications are found on this discharge paper under medication section.  Please follow up at your primary care doctor's office in 1 to 2 weeks.  Once you are discharged, your primary care physician will handle any further medical issues. Please note that NO REFILLS for any discharge medications will be authorized once you are discharged, as it is imperative that you return to your primary care physician (or establish a relationship with a primary care physician if you do not have one) for your aftercare needs so that they can reassess your need for medications and monitor your lab values. Take care,

## 2018-02-15 NOTE — Progress Notes (Signed)
Patient given all discharge instructions and all questions answered. He clearly understood how to take his medications based on MD Gonfa's instructions. He also states he will buy a scale and weigh himself daily. PIV x1 removed. Patient discharged home with brother via wheelchair.

## 2018-02-21 DIAGNOSIS — Z6835 Body mass index (BMI) 35.0-35.9, adult: Secondary | ICD-10-CM | POA: Diagnosis not present

## 2018-02-21 DIAGNOSIS — I509 Heart failure, unspecified: Secondary | ICD-10-CM | POA: Diagnosis not present

## 2018-02-21 DIAGNOSIS — R600 Localized edema: Secondary | ICD-10-CM | POA: Diagnosis not present

## 2018-02-21 DIAGNOSIS — Z23 Encounter for immunization: Secondary | ICD-10-CM | POA: Diagnosis not present

## 2018-02-21 DIAGNOSIS — N342 Other urethritis: Secondary | ICD-10-CM | POA: Diagnosis not present

## 2018-02-21 DIAGNOSIS — K219 Gastro-esophageal reflux disease without esophagitis: Secondary | ICD-10-CM | POA: Diagnosis not present

## 2018-04-29 DIAGNOSIS — R0609 Other forms of dyspnea: Secondary | ICD-10-CM | POA: Diagnosis not present

## 2018-04-29 DIAGNOSIS — E669 Obesity, unspecified: Secondary | ICD-10-CM | POA: Diagnosis not present

## 2018-04-29 DIAGNOSIS — R0789 Other chest pain: Secondary | ICD-10-CM | POA: Diagnosis not present

## 2018-04-29 DIAGNOSIS — E039 Hypothyroidism, unspecified: Secondary | ICD-10-CM | POA: Diagnosis not present

## 2018-04-29 DIAGNOSIS — R0689 Other abnormalities of breathing: Secondary | ICD-10-CM | POA: Diagnosis not present

## 2018-04-29 DIAGNOSIS — Z885 Allergy status to narcotic agent status: Secondary | ICD-10-CM | POA: Diagnosis not present

## 2018-04-29 DIAGNOSIS — K589 Irritable bowel syndrome without diarrhea: Secondary | ICD-10-CM | POA: Diagnosis not present

## 2018-04-29 DIAGNOSIS — R109 Unspecified abdominal pain: Secondary | ICD-10-CM | POA: Diagnosis not present

## 2018-04-29 DIAGNOSIS — R0902 Hypoxemia: Secondary | ICD-10-CM | POA: Diagnosis not present

## 2018-04-29 DIAGNOSIS — Z8614 Personal history of Methicillin resistant Staphylococcus aureus infection: Secondary | ICD-10-CM | POA: Diagnosis not present

## 2018-04-29 DIAGNOSIS — Z79899 Other long term (current) drug therapy: Secondary | ICD-10-CM | POA: Diagnosis not present

## 2018-04-29 DIAGNOSIS — R1013 Epigastric pain: Secondary | ICD-10-CM | POA: Diagnosis not present

## 2018-04-29 DIAGNOSIS — F419 Anxiety disorder, unspecified: Secondary | ICD-10-CM | POA: Diagnosis not present

## 2018-04-29 DIAGNOSIS — K219 Gastro-esophageal reflux disease without esophagitis: Secondary | ICD-10-CM | POA: Diagnosis not present

## 2018-04-29 DIAGNOSIS — F329 Major depressive disorder, single episode, unspecified: Secondary | ICD-10-CM | POA: Diagnosis not present

## 2018-04-29 DIAGNOSIS — R42 Dizziness and giddiness: Secondary | ICD-10-CM | POA: Diagnosis not present

## 2018-04-29 DIAGNOSIS — R079 Chest pain, unspecified: Secondary | ICD-10-CM | POA: Diagnosis not present

## 2018-04-29 DIAGNOSIS — R0600 Dyspnea, unspecified: Secondary | ICD-10-CM | POA: Diagnosis not present

## 2018-04-29 DIAGNOSIS — Z87891 Personal history of nicotine dependence: Secondary | ICD-10-CM | POA: Diagnosis not present

## 2018-04-29 DIAGNOSIS — I10 Essential (primary) hypertension: Secondary | ICD-10-CM | POA: Diagnosis not present

## 2018-04-29 DIAGNOSIS — Z8619 Personal history of other infectious and parasitic diseases: Secondary | ICD-10-CM | POA: Diagnosis not present

## 2018-04-29 DIAGNOSIS — R000 Tachycardia, unspecified: Secondary | ICD-10-CM | POA: Diagnosis not present

## 2018-07-16 DIAGNOSIS — J309 Allergic rhinitis, unspecified: Secondary | ICD-10-CM | POA: Diagnosis not present

## 2018-07-16 DIAGNOSIS — R05 Cough: Secondary | ICD-10-CM | POA: Diagnosis not present

## 2018-07-16 DIAGNOSIS — Z6836 Body mass index (BMI) 36.0-36.9, adult: Secondary | ICD-10-CM | POA: Diagnosis not present

## 2018-07-16 DIAGNOSIS — N3000 Acute cystitis without hematuria: Secondary | ICD-10-CM | POA: Diagnosis not present

## 2018-07-19 DIAGNOSIS — Z6836 Body mass index (BMI) 36.0-36.9, adult: Secondary | ICD-10-CM | POA: Diagnosis not present

## 2018-07-19 DIAGNOSIS — R062 Wheezing: Secondary | ICD-10-CM | POA: Diagnosis not present

## 2018-07-19 DIAGNOSIS — J209 Acute bronchitis, unspecified: Secondary | ICD-10-CM | POA: Diagnosis not present

## 2018-07-19 DIAGNOSIS — R05 Cough: Secondary | ICD-10-CM | POA: Diagnosis not present

## 2018-08-28 DIAGNOSIS — Z6837 Body mass index (BMI) 37.0-37.9, adult: Secondary | ICD-10-CM | POA: Diagnosis not present

## 2018-08-28 DIAGNOSIS — B354 Tinea corporis: Secondary | ICD-10-CM | POA: Diagnosis not present

## 2018-08-28 DIAGNOSIS — R600 Localized edema: Secondary | ICD-10-CM | POA: Diagnosis not present

## 2018-08-28 DIAGNOSIS — I10 Essential (primary) hypertension: Secondary | ICD-10-CM | POA: Diagnosis not present

## 2018-08-28 DIAGNOSIS — R300 Dysuria: Secondary | ICD-10-CM | POA: Diagnosis not present

## 2018-12-06 DIAGNOSIS — R600 Localized edema: Secondary | ICD-10-CM | POA: Diagnosis not present

## 2018-12-06 DIAGNOSIS — N390 Urinary tract infection, site not specified: Secondary | ICD-10-CM | POA: Diagnosis not present

## 2018-12-06 DIAGNOSIS — N342 Other urethritis: Secondary | ICD-10-CM | POA: Diagnosis not present

## 2018-12-06 DIAGNOSIS — Z6837 Body mass index (BMI) 37.0-37.9, adult: Secondary | ICD-10-CM | POA: Diagnosis not present

## 2018-12-13 DIAGNOSIS — R300 Dysuria: Secondary | ICD-10-CM | POA: Diagnosis not present

## 2019-02-13 ENCOUNTER — Other Ambulatory Visit: Payer: Self-pay

## 2019-03-02 DIAGNOSIS — E039 Hypothyroidism, unspecified: Secondary | ICD-10-CM | POA: Diagnosis not present

## 2019-03-02 DIAGNOSIS — I10 Essential (primary) hypertension: Secondary | ICD-10-CM | POA: Diagnosis not present

## 2019-03-02 DIAGNOSIS — Z79899 Other long term (current) drug therapy: Secondary | ICD-10-CM | POA: Diagnosis not present

## 2019-03-02 DIAGNOSIS — S42291A Other displaced fracture of upper end of right humerus, initial encounter for closed fracture: Secondary | ICD-10-CM | POA: Diagnosis not present

## 2019-03-02 DIAGNOSIS — Z87891 Personal history of nicotine dependence: Secondary | ICD-10-CM | POA: Diagnosis not present

## 2019-03-02 DIAGNOSIS — S335XXA Sprain of ligaments of lumbar spine, initial encounter: Secondary | ICD-10-CM | POA: Diagnosis not present

## 2019-03-02 DIAGNOSIS — S4291XA Fracture of right shoulder girdle, part unspecified, initial encounter for closed fracture: Secondary | ICD-10-CM | POA: Diagnosis not present

## 2020-07-16 ENCOUNTER — Other Ambulatory Visit: Payer: Self-pay

## 2020-07-16 ENCOUNTER — Emergency Department (HOSPITAL_COMMUNITY): Payer: Medicare Other

## 2020-07-16 ENCOUNTER — Encounter (HOSPITAL_COMMUNITY): Payer: Self-pay | Admitting: Emergency Medicine

## 2020-07-16 ENCOUNTER — Observation Stay (HOSPITAL_COMMUNITY)
Admission: EM | Admit: 2020-07-16 | Discharge: 2020-07-17 | Disposition: A | Discharge status: Home / Self Care | Payer: Medicare Other | Attending: Internal Medicine | Admitting: Internal Medicine

## 2020-07-16 DIAGNOSIS — R1031 Right lower quadrant pain: Secondary | ICD-10-CM

## 2020-07-16 DIAGNOSIS — Z20822 Contact with and (suspected) exposure to covid-19: Secondary | ICD-10-CM | POA: Diagnosis not present

## 2020-07-16 DIAGNOSIS — G8929 Other chronic pain: Secondary | ICD-10-CM | POA: Diagnosis present

## 2020-07-16 DIAGNOSIS — Z87891 Personal history of nicotine dependence: Secondary | ICD-10-CM | POA: Insufficient documentation

## 2020-07-16 DIAGNOSIS — I5033 Acute on chronic diastolic (congestive) heart failure: Secondary | ICD-10-CM | POA: Diagnosis not present

## 2020-07-16 DIAGNOSIS — E039 Hypothyroidism, unspecified: Secondary | ICD-10-CM | POA: Diagnosis present

## 2020-07-16 DIAGNOSIS — I10 Essential (primary) hypertension: Secondary | ICD-10-CM | POA: Diagnosis present

## 2020-07-16 DIAGNOSIS — R1110 Vomiting, unspecified: Secondary | ICD-10-CM | POA: Diagnosis present

## 2020-07-16 DIAGNOSIS — N179 Acute kidney failure, unspecified: Secondary | ICD-10-CM | POA: Diagnosis not present

## 2020-07-16 DIAGNOSIS — G8220 Paraplegia, unspecified: Secondary | ICD-10-CM | POA: Diagnosis present

## 2020-07-16 DIAGNOSIS — I5032 Chronic diastolic (congestive) heart failure: Secondary | ICD-10-CM | POA: Diagnosis not present

## 2020-07-16 DIAGNOSIS — I110 Hypertensive heart disease with heart failure: Secondary | ICD-10-CM | POA: Diagnosis not present

## 2020-07-16 DIAGNOSIS — Z79899 Other long term (current) drug therapy: Secondary | ICD-10-CM | POA: Diagnosis not present

## 2020-07-16 LAB — CBC WITH DIFFERENTIAL/PLATELET
Abs Immature Granulocytes: 0.14 10*3/uL — ABNORMAL HIGH (ref 0.00–0.07)
Basophils Absolute: 0 10*3/uL (ref 0.0–0.1)
Basophils Relative: 0 %
Eosinophils Absolute: 0 10*3/uL (ref 0.0–0.5)
Eosinophils Relative: 0 %
HCT: 34.7 % — ABNORMAL LOW (ref 39.0–52.0)
Hemoglobin: 11.1 g/dL — ABNORMAL LOW (ref 13.0–17.0)
Immature Granulocytes: 1 %
Lymphocytes Relative: 5 %
Lymphs Abs: 0.6 10*3/uL — ABNORMAL LOW (ref 0.7–4.0)
MCH: 25.6 pg — ABNORMAL LOW (ref 26.0–34.0)
MCHC: 32 g/dL (ref 30.0–36.0)
MCV: 80.1 fL (ref 80.0–100.0)
Monocytes Absolute: 1.1 10*3/uL — ABNORMAL HIGH (ref 0.1–1.0)
Monocytes Relative: 8 %
Neutro Abs: 11.3 10*3/uL — ABNORMAL HIGH (ref 1.7–7.7)
Neutrophils Relative %: 86 %
Platelets: 248 10*3/uL (ref 150–400)
RBC: 4.33 MIL/uL (ref 4.22–5.81)
RDW: 14.7 % (ref 11.5–15.5)
WBC: 13.1 10*3/uL — ABNORMAL HIGH (ref 4.0–10.5)
nRBC: 0 % (ref 0.0–0.2)

## 2020-07-16 LAB — URINALYSIS, ROUTINE W REFLEX MICROSCOPIC
Bacteria, UA: NONE SEEN
Bilirubin Urine: NEGATIVE
Glucose, UA: NEGATIVE mg/dL
Ketones, ur: NEGATIVE mg/dL
Leukocytes,Ua: NEGATIVE
Nitrite: NEGATIVE
Protein, ur: 30 mg/dL — AB
Specific Gravity, Urine: 1.012 (ref 1.005–1.030)
pH: 5 (ref 5.0–8.0)

## 2020-07-16 LAB — COMPREHENSIVE METABOLIC PANEL
ALT: 15 U/L (ref 0–44)
AST: 39 U/L (ref 15–41)
Albumin: 4.3 g/dL (ref 3.5–5.0)
Alkaline Phosphatase: 89 U/L (ref 38–126)
Anion gap: 13 (ref 5–15)
BUN: 32 mg/dL — ABNORMAL HIGH (ref 8–23)
CO2: 17 mmol/L — ABNORMAL LOW (ref 22–32)
Calcium: 8.7 mg/dL — ABNORMAL LOW (ref 8.9–10.3)
Chloride: 101 mmol/L (ref 98–111)
Creatinine, Ser: 3.55 mg/dL — ABNORMAL HIGH (ref 0.61–1.24)
GFR, Estimated: 17 mL/min — ABNORMAL LOW (ref >60)
Glucose, Bld: 82 mg/dL (ref 70–99)
Potassium: 3.6 mmol/L (ref 3.5–5.1)
Sodium: 131 mmol/L — ABNORMAL LOW (ref 135–145)
Total Bilirubin: 0.9 mg/dL (ref 0.3–1.2)
Total Protein: 7.6 g/dL (ref 6.5–8.1)

## 2020-07-16 LAB — RESP PANEL BY RT-PCR (FLU A&B, COVID) ARPGX2
Influenza A by PCR: NEGATIVE
Influenza B by PCR: NEGATIVE
SARS Coronavirus 2 by RT PCR: NEGATIVE

## 2020-07-16 LAB — LACTIC ACID, PLASMA: Lactic Acid, Venous: 1.5 mmol/L (ref 0.5–1.9)

## 2020-07-16 LAB — LIPASE, BLOOD: Lipase: 23 U/L (ref 11–51)

## 2020-07-16 MED ORDER — ONDANSETRON HCL 4 MG/2ML IJ SOLN
4.0000 mg | Freq: Once | INTRAMUSCULAR | Status: AC
  Administered 2020-07-16: 4 mg via INTRAVENOUS
  Filled 2020-07-16: qty 2

## 2020-07-16 MED ORDER — FENTANYL CITRATE (PF) 100 MCG/2ML IJ SOLN
100.0000 ug | Freq: Once | INTRAMUSCULAR | Status: AC
  Administered 2020-07-16: 100 ug via INTRAVENOUS
  Filled 2020-07-16: qty 2

## 2020-07-16 MED ORDER — SODIUM CHLORIDE 0.9 % IV BOLUS (SEPSIS)
1000.0000 mL | Freq: Once | INTRAVENOUS | Status: AC
  Administered 2020-07-16: 1000 mL via INTRAVENOUS

## 2020-07-16 MED ORDER — CHLORHEXIDINE GLUCONATE CLOTH 2 % EX PADS
6.0000 | MEDICATED_PAD | Freq: Every day | CUTANEOUS | Status: DC
  Administered 2020-07-16 – 2020-07-17 (×2): 6 via TOPICAL

## 2020-07-16 MED ORDER — MORPHINE SULFATE (PF) 2 MG/ML IV SOLN
2.0000 mg | INTRAVENOUS | Status: DC | PRN
  Administered 2020-07-16 – 2020-07-17 (×6): 2 mg via INTRAVENOUS
  Filled 2020-07-16 (×6): qty 1

## 2020-07-16 MED ORDER — ACETAMINOPHEN 650 MG RE SUPP
650.0000 mg | Freq: Four times a day (QID) | RECTAL | Status: DC | PRN
Start: 2020-07-16 — End: 2020-07-17

## 2020-07-16 MED ORDER — PANTOPRAZOLE SODIUM 40 MG PO TBEC
40.0000 mg | DELAYED_RELEASE_TABLET | Freq: Two times a day (BID) | ORAL | Status: DC
  Administered 2020-07-16 – 2020-07-17 (×2): 40 mg via ORAL
  Filled 2020-07-16 (×2): qty 1

## 2020-07-16 MED ORDER — CYCLOBENZAPRINE HCL 10 MG PO TABS
5.0000 mg | ORAL_TABLET | Freq: Three times a day (TID) | ORAL | Status: DC
  Administered 2020-07-16 – 2020-07-17 (×3): 5 mg via ORAL
  Filled 2020-07-16 (×3): qty 1

## 2020-07-16 MED ORDER — HEPARIN SODIUM (PORCINE) 5000 UNIT/ML IJ SOLN
5000.0000 [IU] | Freq: Three times a day (TID) | INTRAMUSCULAR | Status: DC
  Administered 2020-07-16 – 2020-07-17 (×2): 5000 [IU] via SUBCUTANEOUS
  Filled 2020-07-16 (×3): qty 1

## 2020-07-16 MED ORDER — ONDANSETRON HCL 4 MG/2ML IJ SOLN: 4.0000 mg | Freq: Four times a day (QID) | INTRAMUSCULAR | Status: DC | PRN

## 2020-07-16 MED ORDER — FENTANYL CITRATE (PF) 100 MCG/2ML IJ SOLN
50.0000 ug | Freq: Once | INTRAMUSCULAR | Status: AC
  Administered 2020-07-16: 50 ug via INTRAVENOUS
  Filled 2020-07-16: qty 2

## 2020-07-16 MED ORDER — ACETAMINOPHEN 325 MG PO TABS
650.0000 mg | ORAL_TABLET | Freq: Four times a day (QID) | ORAL | Status: DC | PRN
Start: 2020-07-16 — End: 2020-07-17

## 2020-07-16 MED ORDER — SODIUM CHLORIDE 0.9 % IV SOLN: INTRAVENOUS | Status: DC

## 2020-07-16 MED ORDER — LEVOTHYROXINE SODIUM 25 MCG PO TABS
125.0000 ug | ORAL_TABLET | Freq: Every day | ORAL | Status: DC
  Administered 2020-07-17: 125 ug via ORAL
  Filled 2020-07-16: qty 1

## 2020-07-16 MED ORDER — ONDANSETRON HCL 4 MG PO TABS: 4.0000 mg | ORAL_TABLET | Freq: Four times a day (QID) | ORAL | Status: DC | PRN

## 2020-07-16 NOTE — H&P (Signed)
History and Physical    Kenneth Rhodes QQV:[id number] DOB: [birth year] DOA: 07/16/2020  PCP: [facility]   Patient coming from: Home  Chief Complaint: vomiting, nausea, and RLQ pain  HPI: Kenneth Rhodes is a 75 y.o. male with medical history significant of HTN, hyperlipidemia, CHF, hypothyroidism, GERD, chronic low back pain w/ hx of opiate addiction, partial BLE paraplegia and neurogenic bladder s/p spinal MRSA infection, R shoulder pain s/p humeral neck fx w/ closed fixation, and chronic hepatitis C infection.   Pt reports 6d ago he had profuse watery diarrhea which resolved, then he began having nausea and nonbloody nonbilious emesis within minutes of any food/fluid intake.During this time pt also noted little/no urine output when he intermittently self-catheters, and most recently pt developed RLQ pain which worsened and prompted him to come to the ED.   RLQ pain began 2d ago and gradually intensified. Pain was characterized as consistently increasing to 8/10, deep and aching in quality, without radiation or change in location. Per pt nothing seems to relieve the pain, and pain is worsened during movement (particularly worse when leaning side to side) and with position (particularly worse when lying flat). Concurrently, nausea and vomiting continued to intensify and pt reports nothing has stayed down in 2-3 days. To a lesser degree, pt also reported mild SOB, chest pain, and esophogastric dysphagia to both solids and liquids before emesis became significant.     Pt currently taking ACEi and diuretics, and feels dehydrated. Past family hx significant for ovarian and colon Ca (dx age 69) in mother, AAA in father, and pt denies family hx of esophageal Ca, kidney disease, or liver disease. Pt quick smoking cigarettes 26 years ago, denies alcohol use, and denies drug use - but prior note pulled-forward with hx of heroin use.  ED Course: At presentation, pt's main concern was  RLQ abdominal pain. Initial differential included appendicitis, bowel obstruction, pancreatitis, and UTI. Pt promptly received CMP, CBC, LFTs, UA, CXR, and non-con CT. CT was unremarkable (full impression below), lowering the likelihood of surgical emergencies such as AAA rupture, bowel rupture, ischemic bowel, or retroperitoneal hematoma and showed no evidence of inflammation, obstruction, or infection. CMP revealed elevated Cr to 3.5 and BUN:Cr of 9.7, and UA revealed profound hemaglobinuria and proteinuria without hematuria most c/w AKI. CMP revealed neutrophil-predominant leukocytosis though pt was afebrile at time of presentation. UCx are pending to workup leukocytosis and Lactic acid is pending to assess possible ischemia. Pt was admitted for management of pre-renal vs intra-renal AKI.      Review of Systems:  Pt endorses nausea, nonbilious nonbloody emesis, diarrhea, and abdominal pain. Pt denies fevers, chills, confusion, hematuria, hematemesis, hematochezia, or melena.    Past Medical History:  Diagnosis Date  . Arthritis   . Bladder spasms   . Blood transfusion without reported diagnosis   . BPH (benign prostatic hypertrophy)   . C. difficile diarrhea   . Chronic hepatitis C ([place])   . Depression   . GERD (gastroesophageal reflux disease)   . Hepatitis C   . Hyperlipidemia   . Hypertension   . Hypothyroidism   . Liver disease   . MRSA (methicillin resistant staph aureus) culture positive   . Neurogenic bladder   . Obesity   . Sleep apnea   . Substance abuse ([place])    pain medication    Past Surgical History:  Procedure Laterality Date  . COLONOSCOPY     ovr 10 yrs ago in [place]. pt said  normal exam  . LUMBAR DISC SURGERY     2008, 2015, 2017  . LUMBAR EPIDURAL INJECTION    . MEDIAL PARTIAL KNEE REPLACEMENT Right   . SPINE SURGERY     due to mrsa  . SUPRAPUBIC CATHETER INSERTION      Social History  reports that he quit smoking about 26 years ago. His smoking use  included cigarettes. He has never used smokeless tobacco. He reports that he does not drink alcohol and does not use drugs.  Allergies  Allergen Reactions  . Hydrocodone Nausea Only    Family History  Problem Relation Age of Onset  . Ovarian cancer Mother   . Colon cancer Mother        dx age 72  . AAA (abdominal aortic aneurysm) Father   . Esophageal cancer Neg Hx   . Kidney disease Neg Hx   . Liver disease Neg Hx      Prior to Admission medications   Medication Sig Start Date End Date Taking? Authorizing Provider  acetaminophen (TYLENOL) 325 MG tablet Take 650 mg by mouth every 4 (four) hours as needed.    Yes [provider]  furosemide (LASIX) 40 MG tablet Take 1 tablet 1-2 times a day as directed. Patient taking differently: Take 40 mg by mouth 2 (two) times daily. 02/15/18  Yes Mercy Riding, MD  levothyroxine (SYNTHROID, LEVOTHROID) 125 MCG tablet TAKE ONE TABLET EVERY MORNING BEFORE BREAKFAST Patient taking differently: Take by mouth daily. 04/26/16  Yes Wardell Honour, MD  lisinopril (PRINIVIL,ZESTRIL) 20 MG tablet Take 1 tablet (20 mg total) by mouth daily. 02/15/18 02/15/19 Yes Mercy Riding, MD  omeprazole (PRILOSEC) 40 MG capsule Take 40 mg by mouth 2 (two) times daily.   Yes [provider]  potassium chloride (K-DUR) 10 MEQ tablet Take 1 tablet (10 mEq total) by mouth 2 (two) times daily. Patient not taking: Reported on 07/16/2020 02/12/18   Virgel Manifold, MD    Physical Exam: Vitals:   07/16/20 0930 07/16/20 0938 07/16/20 1207 07/16/20 1313  BP: (!) 99/56 114/64 (!) 145/85 105/60  Pulse: 85 86 94 77  Resp: 20 14 19 19   Temp:   98.2 F (36.8 C) 98.2 F (36.8 C)  TempSrc:      SpO2: 94% 94% 98% 97%  Weight:      Height:        Constitutional: NAD, calm, belching not actively vomiting, and grimacing in pain during movement  Vitals:   07/16/20 0930 07/16/20 0938 07/16/20 1207 07/16/20 1313  BP: (!) 99/56 114/64 (!) 145/85 105/60   Pulse: 85 86 94 77  Resp: 20 14 19 19   Temp:   98.2 F (36.8 C) 98.2 F (36.8 C)  TempSrc:      SpO2: 94% 94% 98% 97%  Weight:      Height:       Eyes: PERRL, lids and conjunctivae normal. No evidence of scleral icterus. ENMT: Mucous membranes are moist. Posterior pharynx clear of any exudate or lesions.Normal dentition.  Neck: normal, supple, no masses, no thyromegaly. Full active ROM of neck without nuchal signs, pain, or increased effort. Respiratory: clear to auscultation bilaterally, no wheezing, no crackles. Normal respiratory effort. No accessory muscle use.  Cardiovascular: Regular rate and rhythm, no murmurs / rubs / gallops. Pitting lower extremity edema c/w baseline per pt. 1+ pedal pulses. Abdomen: Distended bowel without fluid wave, no active guarding, no tenderness to light palpation in all quadrants, very minimal/mild discomfort  in RLQ to deep palpation, no masses palpated in any quadrant, liver margin smooth, and nonpalpable spleen. Bowel sounds positive in all quadrants.  Musculoskeletal: no clubbing / cyanosis. No joint deformity upper and lower extremities. Poor ROM in R shoulder, with pain to light and deep palpation of R thoracic region down to R hip. No contractures. Normal muscle tone.  GU: Marked pain during CVA tenderness assessment on R side. No pain to palpation over bladder. Skin: no rashes, lesions, ulcers. No induration Neurologic: CN 2-12 grossly intact. Sensation intact, DTR normal. Strength 5/5 in all 4. Psychiatric: Normal judgment and insight. Alert and oriented x 3. Normal mood.   Labs on Admission: I have personally reviewed following labs and imaging studies  CBC: Recent Labs  Lab 07/16/20 0604  WBC 13.1*  NEUTROABS 11.3*  HGB 11.1*  HCT 34.7*  MCV 80.1  PLT [id number]    Basic Metabolic Panel: Recent Labs  Lab 07/16/20 0604  NA 131*  K 3.6  CL 101  CO2 17*  GLUCOSE 82  BUN 32*  CREATININE 3.55*  CALCIUM 8.7*    GFR: Estimated  Creatinine Clearance: 25.6 mL/min (A) (by C-G formula based on SCr of 3.55 mg/dL (H)).  Liver Function Tests: Recent Labs  Lab 07/16/20 0604  AST 39  ALT 15  ALKPHOS 89  BILITOT 0.9  PROT 7.6  ALBUMIN 4.3    Urine analysis:    Component Value Date/Time   COLORURINE YELLOW 07/16/2020 0625   APPEARANCEUR HAZY (A) 07/16/2020 0625   LABSPEC 1.012 07/16/2020 0625   PHURINE 5.0 07/16/2020 0625   GLUCOSEU NEGATIVE 07/16/2020 0625   HGBUR LARGE (A) 07/16/2020 0625   BILIRUBINUR NEGATIVE 07/16/2020 0625   BILIRUBINUR neg 03/18/2015 1159   KETONESUR NEGATIVE 07/16/2020 0625   PROTEINUR 30 (A) 07/16/2020 0625   UROBILINOGEN negative 03/18/2015 1159   UROBILINOGEN 0.2 01/14/2015 1900   NITRITE NEGATIVE 07/16/2020 0625   LEUKOCYTESUR NEGATIVE 07/16/2020 0625    Radiological Exams on Admission: CT ABDOMEN PELVIS WO CONTRAST  Result Date: 07/16/2020 CLINICAL DATA:  75 year old male with right lower abdominal pain for 2 days. EXAM: CT ABDOMEN AND PELVIS WITHOUT CONTRAST TECHNIQUE: Multidetector CT imaging of the abdomen and pelvis was performed following the standard protocol without IV contrast. COMPARISON:  Report of [facility] CT Abdomen and Pelvis 08/19/2016 (no images available). CT Abdomen and Pelvis 09/14/2015 and earlier. FINDINGS: Lower chest: Stable, negative. Hepatobiliary: Negative noncontrast liver and gallbladder. Pancreas: Negative. Spleen: Negative. Adrenals/Urinary Tract: Normal adrenal glands. Benign right renal upper pole cyst with simple fluid density is stable. Nonobstructed kidneys. Punctate right renal midpole nephrolithiasis. No definite left renal calculus. Both ureters appear decompressed and normal. The bladder is decompressed by a Foley catheter. Stomach/Bowel: Mostly decompressed distal large bowel. Redundant sigmoid, the non dependent portion contains gas. Redundant transverse colon with retained low-density stool. Oral contrast administered and has  reached the ileocecal valve without obstruction. Normal appendix visible on series 2, image 55. No large bowel inflammation. No dilated or inflamed small bowel. Oral contrast in the stomach. Negative duodenum. No free air, free fluid. Vascular/Lymphatic: Normal caliber abdominal aorta. No calcified atherosclerosis. Vascular patency is not evaluated in the absence of IV contrast. Tortuous iliac arteries. No lymphadenopathy. Reproductive: Urethral catheter, otherwise negative. Other: No pelvic free fluid. Musculoskeletal: Chronic lower thoracic decompression with interval posterior and interbody fusion hardware placed from the visible lower thoracic spine through T12-L1. Pre-existing posterior lumbar decompression and fusion. Resolved chronic adjacent segment disease at T12-L1 since  2017, but pseudoarthrosis now at T9-T10 with vacuum phenomena and endplate subsidence. T9 pedicle screw loosening. No other No acute osseous abnormality identified. IMPRESSION: 1. No acute or inflammatory process identified in the non-contrast abdomen or pelvis. Normal appendix. Punctate right nephrolithiasis but no obstructive uropathy. 2. Chronic thoracolumbar decompression and fusion with pseudoarthrosis now at T9-T10 and T9 pedicle screw loosening. Electronically Signed   By: Genevie Ann M.D.   On: 07/16/2020 09:45   DG Chest Port 1 View  Result Date: 07/16/2020 CLINICAL DATA:  Shortness of breath. EXAM: PORTABLE CHEST 1 VIEW COMPARISON:  February 14, 2018. FINDINGS: Low lung volumes. No consolidation. Mild interstitial prominence, likely chronic given similar appearance on priors. No visible pleural effusions or pneumothorax. Cardiomediastinal silhouette is within normal limits. Partially imaged right shoulder arthroplasty and thoracolumbar fusion. IMPRESSION: No evidence of acute cardiopulmonary disease. Electronically Signed   By: Margaretha Sheffield MD   On: 07/16/2020 06:30    EKG: Independently reviewed. Mild sinus tachycardia,  otherwise unremarkable.  Assessment/Plan Active Problems:   AKI (acute kidney injury) ([place]) - On presentation, Cr elevated to 3.55, BUN:Cr ~ 9, and UA significant for profound hemaglobinuria, proteinuria, and hyaline casts but no hematuria demonstrating active renal disease c/w AKI - AKI likely exacerbated by acute volume loss (diarrhea, emesis) and concurrent use of ARBs and diuretics - CT not concerning for renal or ureter obstruction - Plan:    - Nephro consulted, appreciate recs    - Rehydrate w/IVF and trend daily BUN and Cr to determine pre-renal vs intra-renal etiologies    - Stop ARB and diuretics during rehydration  RLQ Abdominal Pain, Nausea, and Vomiting - CT abdomen ruled out surgical emergencies as well as obstruction, distention, or inflammatory processes - Fetanyl for acute pain management - Zofran for nausea/vomiting - Will follow symptomatology as AKI resolves  Neutrophilic Leukocytosis - WBCs elevated to 13 with neutrophilia though pt without fever and is SIRS negative - Currently unknown etiology for leukocytosis, reactive vs unclear source of infection - UA negative for WBCs, nitrites, and leukocyte esterase but UCx pending given hx of neurogenic bladder, intermittent cath and abnormal UA - Continue to follow with daily CBCs while pt rehydrates  Diet - Clear Liquid Diet   DVT prophylaxis: Lovenox Code Status:   Full Family Communication:  None yet at this time Disposition Plan:   Patient is from:  [place], [place]  Anticipated DC to:  [place], [place]  Anticipated DC date:  Unknown at this time  Anticipated DC barriers: Resolution of AKI and workup of RLQ pain  Consults called:  Nephrology, appreciate recs Admission status:  Inpatient  Severity of Illness: The appropriate patient status for this patient is INPATIENT. Inpatient status is judged to be reasonable and necessary in order to provide the required intensity of service to ensure the patient's safety. The patient's  presenting symptoms, physical exam findings, and initial radiographic and laboratory data in the context of their chronic comorbidities is felt to place them at high risk for further clinical deterioration. Furthermore, it is not anticipated that the patient will be medically stable for discharge from the hospital within 2 midnights of admission. The following factors support the patient status of inpatient.   " The patient's presenting symptoms include AKI, RLQ pain, and ongoing emesis. " The worrisome physical exam findings include pain during CVA assessment and belching. " The initial radiographic and laboratory data are worrisome because of elevated Cr, abnormal UA, and neutrophilic leukocytosis. " The chronic co-morbidities include  HTN, CHF, chronic pain with hx of opiate addiction, and partial BLE paraplegia and neurogenic bladder s/p spinal MRSA infection.  * I certify that at the point of admission it is my clinical judgment that the patient will require inpatient hospital care spanning beyond 2 midnights from the point of admission due to high intensity of service, high risk for further deterioration and high frequency of surveillance required.Modesta Messing MS4 [facility]  How to contact the [facility] Attending or Consulting provider [place] or covering provider during after hours [place], for this patient?   1. Check the care team in [facility] and look for a) attending/consulting [facility] provider listed and b) the [facility] team listed 2. Log into [email or web address] and use [place]'s universal password to access. If you do not have the password, please contact the hospital operator. 3. Locate the [facility] provider you are looking for under [facility] and page to a number that you can be directly reached. 4. If you still have difficulty reaching the provider, please page the [facility] (Director on Call) for the Hospitalists listed on amion for assistance.  07/16/2020, 3:21 PM

## 2020-07-16 NOTE — Progress Notes (Addendum)
Did not want tap water enema.  Stated that he does use them at home when he has to have a bm but that he doesn't have to have a bm now due to having had diarrhea within the last few days.   No nausea since admission.

## 2020-07-16 NOTE — ED Notes (Signed)
Pelvic exam chaperoned with EDP, bladder scan showed 277mL urine. EDP at bedside and aware, primary RN aware.

## 2020-07-16 NOTE — ED Triage Notes (Signed)
Pt c/o lower right abd pain with little urine output since yesterday. Pt tried to self cath at home but did not get any results. Pt has c/o n/v x 2 days.

## 2020-07-16 NOTE — ED Provider Notes (Signed)
[facility] Provider Note   CSN: [id number] Arrival date & time: 07/16/20  0550     History Chief Complaint  Patient presents with  . Abdominal Pain    Kenneth Rhodes is a 75 y.o. male.  The history is provided by the patient.  Abdominal Pain Pain location:  RLQ Pain quality: aching   Pain severity:  Moderate Onset quality:  Gradual Duration:  2 days Timing:  Intermittent Chronicity:  New Relieved by:  Nothing Worsened by:  Movement Associated symptoms: chest pain, nausea, shortness of breath and vomiting   Associated symptoms: no fever   Patient with history of hypertension, hyperlipidemia, incomplete paraplegia due to MRSA infection presents with right lower quadrant abdominal pain.  Patient reports pain for the past 2 days with associated nausea and vomiting.  He reports very little stool output.  He also reports decreased urine output over the past day.  Patient does have to self cath at home as had minimal urine output. He reports when pain is severe he does feel short of breath and has had chest pain recently    Past Medical History:  Diagnosis Date  . Arthritis   . Bladder spasms   . Blood transfusion without reported diagnosis   . BPH (benign prostatic hypertrophy)   . C. difficile diarrhea   . Chronic hepatitis C ([place])   . Depression   . GERD (gastroesophageal reflux disease)   . Hepatitis C   . Hyperlipidemia   . Hypertension   . Hypothyroidism   . Liver disease   . MRSA (methicillin resistant staph aureus) culture positive   . Neurogenic bladder   . Obesity   . Sleep apnea   . Substance abuse ([place])    pain medication    Patient Active Problem List   Diagnosis Date Noted  . Acute CHF (congestive heart failure) ([place]) 02/14/2018  . Hypertension 02/14/2018  . Hyponatremia 02/14/2018  . GAD (generalized anxiety disorder) 01/19/2015  . Lower abdominal pain 10/22/2014  . Diarrhea 10/22/2014  . Hepatitis C 10/17/2014  .  Testosterone deficiency 06/09/2014  . Vitamin D deficiency 06/09/2014  . Opiate addiction ([place]) 03/26/2014  . Opiate withdrawal ([place]) 03/26/2014  . Depression, major, recurrent ([place]) 03/26/2014  . Suicidal ideation 03/26/2014  . Chronic pain 03/26/2014  . Lumbar radiculopathy, chronic 03/26/2014  . Hypothyroidism 03/26/2014  . BPH (benign prostatic hyperplasia) 03/26/2014  . Bladder spasm 03/26/2014  . Opioid dependence with opioid-induced mood disorder [facility])     Past Surgical History:  Procedure Laterality Date  . COLONOSCOPY     ovr 10 yrs ago in [place]. pt said normal exam  . [place] SURGERY     2008, 2015, 2017  . LUMBAR EPIDURAL INJECTION    . MEDIAL PARTIAL KNEE REPLACEMENT Right   . SPINE SURGERY     due to mrsa  . SUPRAPUBIC CATHETER INSERTION         Family History  Problem Relation Age of Onset  . Ovarian cancer Mother   . Colon cancer Mother        dx age 84  . AAA (abdominal aortic aneurysm) Father   . Esophageal cancer Neg Hx   . Kidney disease Neg Hx   . Liver disease Neg Hx     Social History   Tobacco Use  . Smoking status: Former Smoker    Types: Cigarettes    Quit date: 03/21/1994    Years since quitting: 26.3  . Smokeless tobacco:  Never Used  Substance Use Topics  . Alcohol use: No    Alcohol/week: 0.0 standard drinks  . Drug use: No    Comment: heroin- pulled forward from previoius visit, pt denies drug use.    Home Medications Prior to Admission medications   Medication Sig Start Date End Date Taking? Authorizing Provider  acetaminophen (TYLENOL) 325 MG tablet Take 650 mg by mouth every 4 (four) hours as needed.     [provider]  furosemide (LASIX) 40 MG tablet Take 1 tablet 1-2 times a day as directed. 02/15/18   Mercy Riding, MD  levothyroxine (SYNTHROID, LEVOTHROID) 125 MCG tablet TAKE ONE TABLET EVERY MORNING BEFORE BREAKFAST Patient not taking: Reported on 02/14/2018 04/26/16   Wardell Honour, MD  lisinopril  (PRINIVIL,ZESTRIL) 20 MG tablet Take 1 tablet (20 mg total) by mouth daily. 02/15/18 02/15/19  Mercy Riding, MD  omeprazole (PRILOSEC) 40 MG capsule Take 40 mg by mouth 2 (two) times daily.    [provider]  oxyCODONE ER (XTAMPZA ER) 13.5 MG C12A Take by mouth. 01/09/18   [provider]  potassium chloride (K-DUR) 10 MEQ tablet Take 1 tablet (10 mEq total) by mouth 2 (two) times daily. 02/12/18   Virgel Manifold, MD  sulfamethoxazole-trimethoprim (BACTRIM,SEPTRA) 400-80 MG tablet Take 1 tablet by mouth 2 (two) times daily. 12/16/17   [provider]    Allergies    Hydrocodone  Review of Systems   Review of Systems  Constitutional: Negative for fever.  Respiratory: Positive for shortness of breath.   Cardiovascular: Positive for chest pain.  Gastrointestinal: Positive for abdominal pain, nausea and vomiting.  Genitourinary: Positive for difficulty urinating.  All other systems reviewed and are negative.   Physical Exam Updated Vital Signs BP 130/60   Pulse 83   Temp 98.4 F (36.9 C) (Oral)   Resp 18   Ht 1.854 m (6\' 1" )   Wt 124.7 kg   SpO2 94%   BMI 36.28 kg/m   Physical Exam CONSTITUTIONAL: Elderly, uncomfortable appearing HEAD: Normocephalic/atraumatic EYES: EOMI/PERRL, no icterus ENMT: Mucous membranes dry NECK: supple no meningeal signs SPINE/BACK:entire spine nontender CV: S1/S2 noted LUNGS: Lungs are clear to auscultation bilaterally, no apparent distress ABDOMEN: soft, mild RLQ tenderness, nondistended, no rebound or guarding, decreased bowel sounds noted GU:no cva tenderness NEURO: Pt is awake/alert/appropriate EXTREMITIES: pulses normal/equal, chronic edema of the lower extremities SKIN: warm, color normal PSYCH: no abnormalities of mood noted, alert and oriented to situation  ED Results / Procedures / Treatments   Labs (all labs ordered are listed, but only abnormal results are displayed) Labs Reviewed  CBC WITH  DIFFERENTIAL/PLATELET - Abnormal; Notable for the following components:      Result Value   WBC 13.1 (*)    Hemoglobin 11.1 (*)    HCT 34.7 (*)    MCH 25.6 (*)    Neutro Abs 11.3 (*)    Lymphs Abs 0.6 (*)    Monocytes Absolute 1.1 (*)    Abs Immature Granulocytes 0.14 (*)    All other components within normal limits  COMPREHENSIVE METABOLIC PANEL - Abnormal; Notable for the following components:   Sodium 131 (*)    CO2 17 (*)    BUN 32 (*)    Creatinine, Ser 3.55 (*)    Calcium 8.7 (*)    GFR, Estimated 17 (*)    All other components within normal limits  URINE CULTURE  RESP PANEL BY RT-PCR (FLU A&B, COVID) ARPGX2  LIPASE,  BLOOD  URINALYSIS, ROUTINE W REFLEX MICROSCOPIC    EKG EKG Interpretation  Date/Time:  Thursday July 16 2020 06:12:24 EDT Ventricular Rate:  87 PR Interval:  178 QRS Duration: 101 QT Interval:  341 QTC Calculation: 411 R Axis:   37 Text Interpretation: Sinus tachycardia Atrial premature complexes in couplets Abnormal R-wave progression, late transition Confirmed by Ripley Fraise [phone]) on 07/16/2020 6:16:36 AM   Radiology DG Chest Port 1 View  Result Date: 07/16/2020 CLINICAL DATA:  Shortness of breath. EXAM: PORTABLE CHEST 1 VIEW COMPARISON:  February 14, 2018. FINDINGS: Low lung volumes. No consolidation. Mild interstitial prominence, likely chronic given similar appearance on priors. No visible pleural effusions or pneumothorax. Cardiomediastinal silhouette is within normal limits. Partially imaged right shoulder arthroplasty and thoracolumbar fusion. IMPRESSION: No evidence of acute cardiopulmonary disease. Electronically Signed   By: Margaretha Sheffield MD   On: 07/16/2020 06:30    Procedures Procedures   Medications Ordered in ED Medications  sodium chloride 0.9 % bolus 1,000 mL (has no administration in time range)  ondansetron (ZOFRAN) injection 4 mg (4 mg Intravenous Given 07/16/20 0623)  fentaNYL (SUBLIMAZE) injection 100 mcg (100 mcg  Intravenous Given 07/16/20 8182)    ED Course  I have reviewed the triage vital signs and the nursing notes.  Pertinent labs & imaging results that were available during my care of the patient were reviewed by me and considered in my medical decision making (see chart for details).    MDM Rules/Calculators/A&P                           This patient presents to the ED for concern of abdominal pain, this involves an extensive number of treatment options, and is a complaint that carries with it a high risk of complications and morbidity.  The differential diagnosis includes appendicitis, bowel obstruction, bowel perforation, pancreatitis, urinary tract infection  Lab Tests:   I Ordered, reviewed, and interpreted labs, which included metabolic panel, liver function test, CBC, urinalysis  Medicines ordered:   I ordered medication fentanyl for pain  Imaging Studies ordered:   I ordered imaging studies which included chest xray   I independently visualized and interpreted imaging which showed no acute findings  Additional history obtained:    Previous records obtained and reviewed   6:35 AM Patient presents with increasing abdominal pain.  Patient was unable to self catheter urine at home.  Bladder scan in the ED revealed volume of 296 mL.  In and out cath has been ordered  6:56 AM Pt with Acute renal failure, likely related to vomiting/dehydration No signs of obstructive uropathy He will need CT abd/pelvis without contrast Signed out to Dr Alvino Chapel at shift change Final Clinical Impression(s) / ED Diagnoses Final diagnoses:  AKI (acute kidney injury) ([place])  RLQ abdominal pain    Rx / DC Orders ED Discharge Orders    None       Ripley Fraise, MD 07/16/20 [phone]

## 2020-07-16 NOTE — ED Provider Notes (Signed)
  Physical Exam  BP 114/64   Pulse 86   Temp 98.4 F (36.9 C) (Oral)   Resp 14   Ht 6\' 1"  (1.854 m)   Wt 124.7 kg   SpO2 94%   BMI 36.28 kg/m   Physical Exam  ED Course/Procedures     Procedures  MDM  Received patient in signout.  Abdominal pain.  Right lower quadrant.  Acute kidney injury.  Has had decreased oral intake.  Likely the cause of the acute kidney injury is decreased oral intake.  No bladder outlet obstruction.  CT scan done noncontrast and reassuring.  No clear cause of the right lower quadrant pain.  However the creatinine that is now gone up to 3 patient would benefit from mission to the hospital.  Will discuss with hospitalist.       Davonna Belling, MD 07/16/20 1001

## 2020-07-17 DIAGNOSIS — I5032 Chronic diastolic (congestive) heart failure: Secondary | ICD-10-CM | POA: Diagnosis not present

## 2020-07-17 DIAGNOSIS — N179 Acute kidney failure, unspecified: Secondary | ICD-10-CM | POA: Diagnosis not present

## 2020-07-17 DIAGNOSIS — E039 Hypothyroidism, unspecified: Secondary | ICD-10-CM | POA: Diagnosis not present

## 2020-07-17 DIAGNOSIS — I10 Essential (primary) hypertension: Secondary | ICD-10-CM | POA: Diagnosis not present

## 2020-07-17 LAB — COMPREHENSIVE METABOLIC PANEL
ALT: 13 U/L (ref 0–44)
AST: 39 U/L (ref 15–41)
Albumin: 3.4 g/dL — ABNORMAL LOW (ref 3.5–5.0)
Alkaline Phosphatase: 68 U/L (ref 38–126)
Anion gap: 7 (ref 5–15)
BUN: 22 mg/dL (ref 8–23)
CO2: 18 mmol/L — ABNORMAL LOW (ref 22–32)
Calcium: 8.5 mg/dL — ABNORMAL LOW (ref 8.9–10.3)
Chloride: 109 mmol/L (ref 98–111)
Creatinine, Ser: 1.49 mg/dL — ABNORMAL HIGH (ref 0.61–1.24)
GFR, Estimated: 49 mL/min — ABNORMAL LOW (ref >60)
Glucose, Bld: 77 mg/dL (ref 70–99)
Potassium: 4.1 mmol/L (ref 3.5–5.1)
Sodium: 134 mmol/L — ABNORMAL LOW (ref 135–145)
Total Bilirubin: 0.8 mg/dL (ref 0.3–1.2)
Total Protein: 6.2 g/dL — ABNORMAL LOW (ref 6.5–8.1)

## 2020-07-17 LAB — CBC
HCT: 31.6 % — ABNORMAL LOW (ref 39.0–52.0)
Hemoglobin: 9.8 g/dL — ABNORMAL LOW (ref 13.0–17.0)
MCH: 25.5 pg — ABNORMAL LOW (ref 26.0–34.0)
MCHC: 31 g/dL (ref 30.0–36.0)
MCV: 82.1 fL (ref 80.0–100.0)
Platelets: 184 10*3/uL (ref 150–400)
RBC: 3.85 MIL/uL — ABNORMAL LOW (ref 4.22–5.81)
RDW: 15.3 % (ref 11.5–15.5)
WBC: 6.8 10*3/uL (ref 4.0–10.5)
nRBC: 0 % (ref 0.0–0.2)

## 2020-07-17 LAB — URINE CULTURE: Culture: NO GROWTH

## 2020-07-17 MED ORDER — OXYCODONE HCL 5 MG PO TABS
5.0000 mg | ORAL_TABLET | Freq: Once | ORAL | Status: AC
  Administered 2020-07-17: 5 mg via ORAL
  Filled 2020-07-17: qty 1

## 2020-07-17 MED ORDER — CYCLOBENZAPRINE HCL 5 MG PO TABS: 5.0000 mg | ORAL_TABLET | Freq: Three times a day (TID) | ORAL | 0 refills | Status: DC | PRN

## 2020-07-17 NOTE — TOC Transition Note (Signed)
Transition of Care [facility]) - CM/SW Discharge Note   Patient Details  Name: Kenneth Rhodes MRN: [id number] Date of Birth: 03-Jun-1945  [facility] of [facility] CM/SW Contact:  Iona Beard, [place] Phone Number: 07/17/2020, 1:57 PM   Clinical Narrative:    TOC updated pt is in need of rollator for home. CSW reached out to Sherrell Puller with [employer] who states she is able to accept referral. CSW had pt sign onsite delivery ticket and will deliver rollator to pt in room before d/c. Delivery ticket provided to [facility] via scan to her email [email or web address]. TOC signing off.   Final next level of care: Home/Self Care Barriers to Discharge: Barriers Resolved   Patient Goals and CMS Choice Patient states their goals for this hospitalization and ongoing recovery are:: Return home CMS Medicare.gov Compare Post Acute Care list provided to:: Patient Choice offered to / list presented to : Patient  Discharge Placement                       Discharge Plan and Services                DME Arranged: Walker rolling DME Agency: [facility] Date DME Agency Contacted: 07/17/20 Time DME Agency Contacted: [id number] Representative spoke with at DME Agency: Sherrell Puller HH Arranged: NA [place] Agency: NA        Social Determinants of Health ([place]) Interventions     Readmission Risk Interventions No flowsheet data found.

## 2020-07-17 NOTE — Plan of Care (Signed)
  Problem: Acute Rehab PT Goals(only PT should resolve) Goal: Pt Will Go Supine/Side To Sit Outcome: Progressing Flowsheets (Taken 07/17/2020 1348) Pt will go Supine/Side to Sit: with modified independence Goal: Pt Will Go Sit To Supine/Side Outcome: Progressing Flowsheets (Taken 07/17/2020 1348) Pt will go Sit to Supine/Side: with modified independence Goal: Patient Will Transfer Sit To/From Stand Outcome: Progressing Flowsheets (Taken 07/17/2020 1348) Patient will transfer sit to/from stand: with supervision Goal: Pt Will Transfer Bed To Chair/Chair To Bed Outcome: Progressing Flowsheets (Taken 07/17/2020 1348) Pt will Transfer Bed to Chair/Chair to Bed: with supervision Goal: Pt Will Ambulate Outcome: Progressing Flowsheets (Taken 07/17/2020 1348) Pt will Ambulate:  > 125 feet  with supervision  with least restrictive assistive device Goal: Pt/caregiver will Perform Home Exercise Program Outcome: Progressing Flowsheets (Taken 07/17/2020 1348) Pt/caregiver will Perform Home Exercise Program:  For increased strengthening  For improved balance  With Supervision, verbal cues required/provided   Floria Raveling. Hartnett-Rands, MS, PT Per [place] [phone] 07/17/2020

## 2020-07-17 NOTE — Evaluation (Signed)
Physical Therapy Evaluation Patient Details Name: Kenneth Rhodes MRN: [id number] DOB: 01/30/1946 Today's Date: 07/17/2020   History of Present Illness  Kenneth Rhodes is a 75 y.o. male with medical history significant of HTN, hyperlipidemia, CHF, hypothyroidism, GERD, chronic low back pain w/ hx of opiate addiction, partial BLE paraplegia and neurogenic bladder s/p spinal MRSA infection, R shoulder pain s/p humeral neck fx w/ closed fixation, and chronic hepatitis C infection.      Pt reports 6d ago he had profuse watery diarrhea which resolved, then he began having nausea and nonbloody nonbilious emesis within minutes of any food/fluid intake.During this time pt also noted little/no urine output when he intermittently self-catheters, and most recently pt developed RLQ pain which worsened and prompted him to come to the ED.      RLQ pain began 2d ago and gradually intensified. Pain was characterized as consistently increasing to 8/10, deep and aching in quality, without radiation or change in location. Per pt nothing seems to relieve the pain, and pain is worsened during movement (particularly worse when leaning side to side) and with position (particularly worse when lying flat). Concurrently, nausea and vomiting continued to intensify and pt reports nothing has stayed down in 2-3 days. To a lesser degree, pt also reported mild SOB, chest pain, and esophogastric dysphagia to both solids and liquids before emesis became significant.    Clinical Impression  Pt admitted with above diagnosis. Patient agreeable to participating in PT evaluation today. Patient reports he was largely independent in his home prior to admission. He has not driven since his last back surgery 4 years ago. Patient was able to perform all mobility with min guard for safety only. He ambulated with RW with good use. Upon returning to recliner, patient began eating his lunch. Prior to PT departure, patient reported he needed to  go to the bathroom. Patient performed transfer from recliner with use of armrest and transfer from commode using right hand on RW and left on safety bar. Both transfers were performed with supervision to min guard for safety. Patient reports he is currently functioning near his baseline level and he feels comfortable going home. Patient did report he walks within his home behind his manual wheelchair. Patient would benefit from a 4 wheeled walker with brakes and a seat to decrease his risk for falls at home. Pt currently with functional limitations due to the deficits listed below (see PT Problem List). Pt will benefit from skilled PT to increase their independence and safety with mobility to allow discharge to the venue listed below.       Follow Up Recommendations No PT follow up;Supervision - Intermittent    Equipment Recommendations  Other (comment) (Rollator/4 wheeled walker with brakes and seat)    Recommendations for Other Services       Precautions / Restrictions Precautions Precautions: Fall Restrictions Weight Bearing Restrictions: No      Mobility  Bed Mobility Overal bed mobility: Modified Independent Bed Mobility: Supine to Sit     Supine to sit: Modified independent (Device/Increase time)          Transfers Overall transfer level: Needs assistance Equipment used: Rolling walker (2 wheeled) Transfers: Sit to/from [facility] Sit to Stand: Min guard;Supervision Stand pivot transfers: Min guard;Supervision       General transfer comment: min guard for safety  Ambulation/Gait Ambulation/Gait assistance: Min guard Gait Distance (Feet): 120 Feet Assistive device: Rolling walker (2 wheeled) Gait Pattern/deviations: Step-through pattern;Decreased step length -  right;Decreased step length - left;Decreased stride length;Trunk flexed;Wide base of support Gait velocity: decreased   General Gait Details: somewhat slow gait with RW, significant trunk  flexion (patient reporting this is his normal posture since last back surgery 4 years ago), on room air; distance not limited by fatigue or pain  Stairs    Wheelchair Mobility    Modified Rankin (Stroke Patients Only)       Balance Overall balance assessment: Mild deficits observed, not formally tested        Pertinent Vitals/Pain Pain Assessment: 0-10 Pain Score: 8  Pain Location: right side low back Pain Intervention(s): Limited activity within patient's tolerance;Monitored during session;Repositioned    Home Living Family/patient expects to be discharged to:: Private residence Living Arrangements: Alone Available Help at Discharge: Family;Available PRN/intermittently   Home Access: Level entry     Home Layout: One level Home Equipment: Wheelchair - manual;Shower seat;Hand held shower head;Cane - single point;Bedside commode;Walker - standard      Prior Function Level of Independence: Needs assistance   Gait / Transfers Assistance Needed: walked household distances behind manual wheelchair  ADL's / Homemaking Assistance Needed: performed most ADLs independently but family members would bring him food and help him clean at times  Comments: is no longer driving     Hand Dominance   Dominant Hand: Right    Extremity/Trunk Assessment   Upper Extremity Assessment Upper Extremity Assessment: RUE deficits/detail RUE Deficits / Details: decrease ROM, history of total shoulder replacement         Cervical / Trunk Assessment Cervical / Trunk Assessment: Kyphotic  Communication   Communication: No difficulties  Cognition Arousal/Alertness: Awake/alert Behavior During Therapy: WFL for tasks assessed/performed Overall Cognitive Status: Within Functional Limits for tasks assessed      General Comments      Exercises     Assessment/Plan    PT Assessment Patient needs continued PT services  PT Problem List Decreased strength;Decreased mobility        PT Treatment Interventions DME instruction;Gait training;Therapeutic activities;Therapeutic exercise;Patient/family education    PT Goals (Current goals can be found in the Care Plan section)  Acute Rehab PT Goals Patient Stated Goal: Go home PT Goal Formulation: With patient Time For Goal Achievement: 07/31/20 Potential to Achieve Goals: Good    Frequency Min 3X/week   Barriers to discharge           AM-PAC PT "6 Clicks" Mobility  Outcome Measure Help needed turning from your back to your side while in a flat bed without using bedrails?: A Little Help needed moving from lying on your back to sitting on the side of a flat bed without using bedrails?: A Little Help needed moving to and from a bed to a chair (including a wheelchair)?: A Little Help needed standing up from a chair using your arms (e.g., wheelchair or bedside chair)?: A Little Help needed to walk in hospital room?: A Little Help needed climbing 3-5 steps with a railing? : A Lot 6 Click Score: 17    End of Session   Activity Tolerance: Patient tolerated treatment well Patient left: in chair;with chair alarm set;with call bell/phone within reach Nurse Communication: Mobility status PT Visit Diagnosis: Other abnormalities of gait and mobility (R26.89);Muscle weakness (generalized) (M62.81)    Time: 1310-1340 PT Time Calculation (min) (ACUTE ONLY): 30 min   Charges:     PT Treatments $Therapeutic Activity: 23-37 mins        Floria Raveling. Hartnett-Rands, MS, PT Per [employer]  [place] [phone]  Pamala Hurry  Hartnett-Rands 07/17/2020, 1:43 PM

## 2020-07-17 NOTE — Care Management Obs Status (Signed)
[place] NOTIFICATION   Patient Details  Name: Kenneth Rhodes MRN: [id number] Date of Birth: [birth year]   Medicare Observation Status Notification Given:  Yes    Tommy Medal 07/17/2020, 2:06 PM

## 2020-07-17 NOTE — Discharge Summary (Signed)
Physician Discharge Summary  Kenneth Rhodes XTG:[id number] DOB: 1945/07/10 DOA: 07/16/2020  PCP: [facility]  Admit date: 07/16/2020 Discharge date: 07/17/2020  Admitted From: home Disposition:  home  Recommendations for Outpatient Follow-up:  1. Follow up with PCP in 1-2 weeks 2. Please obtain BMP/CBC in one week 3. Patient referred to Dr. Merlene Laughter for pain management  Home Health:HH PT, RN Equipment/Devices:rolling walker with seat  Discharge Condition: Stable CODE STATUS: Full code Diet recommendation: Heart healthy  Brief/Interim Summary: 75 year old male with a history of paraplegia from prior MRSA spinal infection, admitted to the hospital with right lower quadrant abdominal pain and persistent vomiting.  His p.o. intake has been poor.  He was admitted with acute kidney injury.  He also been taking ACE inhibitor's and diuretics.  He was treated with intravenous fluids.  Creatinine quickly improved.  He did not have any evidence of hydronephrosis.  Diet was advanced to solid food and he tolerated this well.  CT of the abdomen pelvis did not show any acute findings.  I suspect that his symptoms were related to an acute gastroenteritis leading to dehydration and renal failure.  Overall symptoms have improved and he is tolerating solid diet.  He has good urine output and renal function has improved.  He also did complain of right-sided back pain which appears to be more of a chronic issue.  He has had this for the past several months.  He has chronic spinal issues and has had several surgeries in the past.  Recommend that he follow-up with a pain management clinic.  Discharge Diagnoses:  Active Problems:   Chronic pain   Hypothyroidism   Hypertension   AKI (acute kidney injury) ([place])   Chronic diastolic CHF (congestive heart failure) ([place])   Vomiting   Paraplegia [facility])    Discharge Instructions  Discharge Instructions    Diet - low sodium heart healthy    Complete by: As directed    Increase activity slowly   Complete by: As directed      Allergies as of 07/17/2020      Reactions   Hydrocodone Nausea Only      Medication List    STOP taking these medications   furosemide 40 MG tablet Commonly known as: Lasix   lisinopril 20 MG tablet Commonly known as: ZESTRIL   potassium chloride 10 MEQ tablet Commonly known as: KLOR-CON     TAKE these medications   acetaminophen 325 MG tablet Commonly known as: TYLENOL Take 650 mg by mouth every 4 (four) hours as needed.   cyclobenzaprine 5 MG tablet Commonly known as: FLEXERIL Take 1 tablet (5 mg total) by mouth 3 (three) times daily as needed for muscle spasms.   levothyroxine 125 MCG tablet Commonly known as: SYNTHROID TAKE ONE TABLET EVERY MORNING BEFORE BREAKFAST What changed: See the new instructions.   omeprazole 40 MG capsule Commonly known as: PRILOSEC Take 40 mg by mouth 2 (two) times daily.            Durable Medical Equipment  (From admission, onward)         Start     Ordered   07/17/20 1356  For home use only DME 4 wheeled rolling walker with seat  Once       Question:  Patient needs a walker to treat with the following condition  Answer:  Paraplegia ([place])   07/17/20 1356          Follow-up Information    [facility]. Schedule an appointment as soon as possible for a visit in 2 week(s).   Why: recheck kidney function with blood work Contact information: [place] [address] [phone]        Phillips Odor, MD Follow up.   Specialty: Neurology Why: call for pain management appointment Contact information: [address] [place] [address] [phone]              Allergies  Allergen Reactions  . Hydrocodone Nausea Only    Consultations:     Procedures/Studies: CT ABDOMEN PELVIS WO CONTRAST  Result Date: 07/16/2020 CLINICAL DATA:  75 year old male with right lower abdominal pain for 2  days. EXAM: CT ABDOMEN AND PELVIS WITHOUT CONTRAST TECHNIQUE: Multidetector CT imaging of the abdomen and pelvis was performed following the standard protocol without IV contrast. COMPARISON:  Report of [facility] CT Abdomen and Pelvis 08/19/2016 (no images available). CT Abdomen and Pelvis 09/14/2015 and earlier. FINDINGS: Lower chest: Stable, negative. Hepatobiliary: Negative noncontrast liver and gallbladder. Pancreas: Negative. Spleen: Negative. Adrenals/Urinary Tract: Normal adrenal glands. Benign right renal upper pole cyst with simple fluid density is stable. Nonobstructed kidneys. Punctate right renal midpole nephrolithiasis. No definite left renal calculus. Both ureters appear decompressed and normal. The bladder is decompressed by a Foley catheter. Stomach/Bowel: Mostly decompressed distal large bowel. Redundant sigmoid, the non dependent portion contains gas. Redundant transverse colon with retained low-density stool. Oral contrast administered and has reached the ileocecal valve without obstruction. Normal appendix visible on series 2, image 55. No large bowel inflammation. No dilated or inflamed small bowel. Oral contrast in the stomach. Negative duodenum. No free air, free fluid. Vascular/Lymphatic: Normal caliber abdominal aorta. No calcified atherosclerosis. Vascular patency is not evaluated in the absence of IV contrast. Tortuous iliac arteries. No lymphadenopathy. Reproductive: Urethral catheter, otherwise negative. Other: No pelvic free fluid. Musculoskeletal: Chronic lower thoracic decompression with interval posterior and interbody fusion hardware placed from the visible lower thoracic spine through T12-L1. Pre-existing posterior lumbar decompression and fusion. Resolved chronic adjacent segment disease at T12-L1 since 2017, but pseudoarthrosis now at T9-T10 with vacuum phenomena and endplate subsidence. T9 pedicle screw loosening. No other No acute osseous abnormality identified.  IMPRESSION: 1. No acute or inflammatory process identified in the non-contrast abdomen or pelvis. Normal appendix. Punctate right nephrolithiasis but no obstructive uropathy. 2. Chronic thoracolumbar decompression and fusion with pseudoarthrosis now at T9-T10 and T9 pedicle screw loosening. Electronically Signed   By: Genevie Ann M.D.   On: 07/16/2020 09:45   DG Chest Port 1 View  Result Date: 07/16/2020 CLINICAL DATA:  Shortness of breath. EXAM: PORTABLE CHEST 1 VIEW COMPARISON:  February 14, 2018. FINDINGS: Low lung volumes. No consolidation. Mild interstitial prominence, likely chronic given similar appearance on priors. No visible pleural effusions or pneumothorax. Cardiomediastinal silhouette is within normal limits. Partially imaged right shoulder arthroplasty and thoracolumbar fusion. IMPRESSION: No evidence of acute cardiopulmonary disease. Electronically Signed   By: Margaretha Sheffield MD   On: 07/16/2020 06:30       Subjective: No vomiting.  Tolerating solid diet.  Abdominal pain is improved  Discharge Exam: Vitals:   07/16/20 2213 07/17/20 0210 07/17/20 0603 07/17/20 1235  BP: 116/69 112/70 114/60 115/66  Pulse: 73 71 71 69  Resp: 18 18 18 20   Temp: 98.3 F (36.8 C) 98.2 F (36.8 C) 98.6 F (37 C) 98.3 F (36.8 C)  TempSrc: Oral Oral Oral Oral  SpO2: 95% 96% 98% 96%  Weight:  Height:        General: Pt is alert, awake, not in acute distress Cardiovascular: RRR, S1/S2 +, no rubs, no gallops Respiratory: CTA bilaterally, no wheezing, no rhonchi Abdominal: Soft, NT, ND, bowel sounds + Extremities: no edema, no cyanosis    The results of significant diagnostics from this hospitalization (including imaging, microbiology, ancillary and laboratory) are listed below for reference.     Microbiology: Recent Results (from the past 240 hour(s))  Urine culture     Status: None   Collection Time: 07/16/20  6:25 AM   Specimen: Urine, Catheterized  Result Value Ref Range  Status   Specimen Description   Final    URINE, CATHETERIZED Performed at [facility], [address], [place], [place] [address]    Special Requests   Final    NONE Performed at [facility], [address], [place], [place] [address]    Culture   Final    NO GROWTH Performed at [facility], [place] [address], [place], [place] [address]    Report Status 07/17/2020 FINAL  Final  Resp Panel by RT-PCR (Flu A&B, Covid) Nasopharyngeal Swab     Status: None   Collection Time: 07/16/20  7:15 AM   Specimen: Nasopharyngeal Swab; Nasopharyngeal(NP) swabs in vial transport medium  Result Value Ref Range Status   SARS Coronavirus 2 by RT PCR NEGATIVE NEGATIVE Final    Comment: (NOTE) SARS-CoV-2 target nucleic acids are NOT DETECTED.  The SARS-CoV-2 RNA is generally detectable in upper respiratory specimens during the acute phase of infection. The lowest concentration of SARS-CoV-2 viral copies this assay can detect is 138 copies/mL. A negative result does not preclude SARS-Cov-2 infection and should not be used as the sole basis for treatment or other patient management decisions. A negative result may occur with  improper specimen collection/handling, submission of specimen other than nasopharyngeal swab, presence of viral mutation(s) within the areas targeted by this assay, and inadequate number of viral copies(<138 copies/mL). A negative result must be combined with clinical observations, patient history, and epidemiological information. The expected result is Negative.  Fact Sheet for Patients:  EntrepreneurPulse.com.au  Fact Sheet for Healthcare Providers:  IncredibleEmployment.be  This test is no t yet approved or cleared by the [place] FDA and  has been authorized for detection and/or diagnosis of SARS-CoV-2 by FDA under an Emergency Use Authorization (EUA). This EUA will remain  in effect (meaning this test can be used) for the  duration of the COVID-19 declaration under Section 564(b)(1) of the Act, 21 U.S.C.section 360bbb-3(b)(1), unless the authorization is terminated  or revoked sooner.       Influenza A by PCR NEGATIVE NEGATIVE Final   Influenza B by PCR NEGATIVE NEGATIVE Final    Comment: (NOTE) The Xpert Xpress SARS-CoV-2/FLU/RSV plus assay is intended as an aid in the diagnosis of influenza from Nasopharyngeal swab specimens and should not be used as a sole basis for treatment. Nasal washings and aspirates are unacceptable for Xpert Xpress SARS-CoV-2/FLU/RSV testing.  Fact Sheet for Patients: EntrepreneurPulse.com.au  Fact Sheet for Healthcare Providers: IncredibleEmployment.be  This test is not yet approved or cleared by the [place] FDA and has been authorized for detection and/or diagnosis of SARS-CoV-2 by FDA under an Emergency Use Authorization (EUA). This EUA will remain in effect (meaning this test can be used) for the duration of the COVID-19 declaration under Section 564(b)(1) of the Act, 21 U.S.C. section 360bbb-3(b)(1), unless the authorization is terminated or revoked.  Performed  at [facility], [address], [place], [place] [address]      Labs: BNP (last 3 results) No results for input(s): BNP in the last 8760 hours. Basic Metabolic Panel: Recent Labs  Lab 07/16/20 0604 07/17/20 0412  NA 131* 134*  K 3.6 4.1  CL 101 109  CO2 17* 18*  GLUCOSE 82 77  BUN 32* 22  CREATININE 3.55* 1.49*  CALCIUM 8.7* 8.5*   Liver Function Tests: Recent Labs  Lab 07/16/20 0604 07/17/20 0412  AST 39 39  ALT 15 13  ALKPHOS 89 68  BILITOT 0.9 0.8  PROT 7.6 6.2*  ALBUMIN 4.3 3.4*   Recent Labs  Lab 07/16/20 0604  LIPASE 23   No results for input(s): AMMONIA in the last 168 hours. CBC: Recent Labs  Lab 07/16/20 0604 07/17/20 0412  WBC 13.1* 6.8  NEUTROABS 11.3*  --   HGB 11.1* 9.8*  HCT 34.7* 31.6*  MCV 80.1 82.1  PLT 248 184    Cardiac Enzymes: No results for input(s): CKTOTAL, CKMB, CKMBINDEX, TROPONINI in the last 168 hours. BNP: Invalid input(s): POCBNP CBG: No results for input(s): GLUCAP in the last 168 hours. D-Dimer No results for input(s): DDIMER in the last 72 hours. Hgb A1c No results for input(s): HGBA1C in the last 72 hours. Lipid Profile No results for input(s): CHOL, HDL, LDLCALC, TRIG, CHOLHDL, LDLDIRECT in the last 72 hours. Thyroid function studies No results for input(s): TSH, T4TOTAL, T3FREE, THYROIDAB in the last 72 hours.  Invalid input(s): FREET3 Anemia work up No results for input(s): VITAMINB12, FOLATE, FERRITIN, TIBC, IRON, RETICCTPCT in the last 72 hours. Urinalysis    Component Value Date/Time   COLORURINE YELLOW 07/16/2020 0625   APPEARANCEUR HAZY (A) 07/16/2020 0625   LABSPEC 1.012 07/16/2020 0625   PHURINE 5.0 07/16/2020 0625   GLUCOSEU NEGATIVE 07/16/2020 0625   HGBUR LARGE (A) 07/16/2020 0625   BILIRUBINUR NEGATIVE 07/16/2020 0625   BILIRUBINUR neg 03/18/2015 1159   KETONESUR NEGATIVE 07/16/2020 0625   PROTEINUR 30 (A) 07/16/2020 0625   UROBILINOGEN negative 03/18/2015 1159   UROBILINOGEN 0.2 01/14/2015 1900   NITRITE NEGATIVE 07/16/2020 0625   LEUKOCYTESUR NEGATIVE 07/16/2020 0625   Sepsis Labs Invalid input(s): PROCALCITONIN,  WBC,  LACTICIDVEN Microbiology Recent Results (from the past 240 hour(s))  Urine culture     Status: None   Collection Time: 07/16/20  6:25 AM   Specimen: Urine, Catheterized  Result Value Ref Range Status   Specimen Description   Final    URINE, CATHETERIZED Performed at [facility], [address], [place], [place] [address]    Special Requests   Final    NONE Performed at [facility], [address], [place], [place] [address]    Culture   Final    NO GROWTH Performed at [facility], [place] [address], [place],  [address]    Report Status 07/17/2020 FINAL  Final  Resp Panel by RT-PCR (Flu A&B, Covid)  Nasopharyngeal Swab     Status: None   Collection Time: 07/16/20  7:15 AM   Specimen: Nasopharyngeal Swab; Nasopharyngeal(NP) swabs in vial transport medium  Result Value Ref Range Status   SARS Coronavirus 2 by RT PCR NEGATIVE NEGATIVE Final    Comment: (NOTE) SARS-CoV-2 target nucleic acids are NOT DETECTED.  The SARS-CoV-2 RNA is generally detectable in upper respiratory specimens during the acute phase of infection. The lowest concentration of SARS-CoV-2 viral copies this assay can detect is 138 copies/mL. A negative result does  not preclude SARS-Cov-2 infection and should not be used as the sole basis for treatment or other patient management decisions. A negative result may occur with  improper specimen collection/handling, submission of specimen other than nasopharyngeal swab, presence of viral mutation(s) within the areas targeted by this assay, and inadequate number of viral copies(<138 copies/mL). A negative result must be combined with clinical observations, patient history, and epidemiological information. The expected result is Negative.  Fact Sheet for Patients:  EntrepreneurPulse.com.au  Fact Sheet for Healthcare Providers:  IncredibleEmployment.be  This test is no t yet approved or cleared by the [place] FDA and  has been authorized for detection and/or diagnosis of SARS-CoV-2 by FDA under an Emergency Use Authorization (EUA). This EUA will remain  in effect (meaning this test can be used) for the duration of the COVID-19 declaration under Section 564(b)(1) of the Act, 21 U.S.C.section 360bbb-3(b)(1), unless the authorization is terminated  or revoked sooner.       Influenza A by PCR NEGATIVE NEGATIVE Final   Influenza B by PCR NEGATIVE NEGATIVE Final    Comment: (NOTE) The Xpert Xpress SARS-CoV-2/FLU/RSV plus assay is intended as an aid in the diagnosis of influenza from Nasopharyngeal swab specimens and should not be  used as a sole basis for treatment. Nasal washings and aspirates are unacceptable for Xpert Xpress SARS-CoV-2/FLU/RSV testing.  Fact Sheet for Patients: EntrepreneurPulse.com.au  Fact Sheet for Healthcare Providers: IncredibleEmployment.be  This test is not yet approved or cleared by the [place] FDA and has been authorized for detection and/or diagnosis of SARS-CoV-2 by FDA under an Emergency Use Authorization (EUA). This EUA will remain in effect (meaning this test can be used) for the duration of the COVID-19 declaration under Section 564(b)(1) of the Act, 21 U.S.C. section 360bbb-3(b)(1), unless the authorization is terminated or revoked.  Performed at [facility], [address], [place], [place] [address]      Time coordinating discharge: 93mins  SIGNED:   Kathie Dike, MD  [facility] 07/17/2020, 7:58 PM   If 7PM-7AM, please contact night-coverage [email or web address]

## 2020-07-17 NOTE — Progress Notes (Signed)
Patient complaining that Morphine is not doing anything for his pain. I let on call Dr know and she prescribed him a one time Oxy 5mg  ti take at 0730.

## 2020-07-23 ENCOUNTER — Emergency Department (HOSPITAL_COMMUNITY)
Admission: EM | Admit: 2020-07-23 | Discharge: 2020-07-23 | Disposition: A | Discharge status: Home / Self Care | Payer: Medicare Other | Attending: Emergency Medicine | Admitting: Emergency Medicine

## 2020-07-23 ENCOUNTER — Encounter (HOSPITAL_COMMUNITY): Payer: Self-pay | Admitting: Emergency Medicine

## 2020-07-23 ENCOUNTER — Emergency Department (HOSPITAL_COMMUNITY): Payer: Medicare Other

## 2020-07-23 ENCOUNTER — Other Ambulatory Visit: Payer: Self-pay

## 2020-07-23 DIAGNOSIS — Z79899 Other long term (current) drug therapy: Secondary | ICD-10-CM | POA: Insufficient documentation

## 2020-07-23 DIAGNOSIS — R600 Localized edema: Secondary | ICD-10-CM | POA: Diagnosis not present

## 2020-07-23 DIAGNOSIS — Z96651 Presence of right artificial knee joint: Secondary | ICD-10-CM | POA: Insufficient documentation

## 2020-07-23 DIAGNOSIS — Z87891 Personal history of nicotine dependence: Secondary | ICD-10-CM | POA: Diagnosis not present

## 2020-07-23 DIAGNOSIS — E039 Hypothyroidism, unspecified: Secondary | ICD-10-CM | POA: Insufficient documentation

## 2020-07-23 DIAGNOSIS — I5032 Chronic diastolic (congestive) heart failure: Secondary | ICD-10-CM | POA: Diagnosis not present

## 2020-07-23 DIAGNOSIS — R609 Edema, unspecified: Secondary | ICD-10-CM

## 2020-07-23 DIAGNOSIS — I110 Hypertensive heart disease with heart failure: Secondary | ICD-10-CM | POA: Insufficient documentation

## 2020-07-23 DIAGNOSIS — R2243 Localized swelling, mass and lump, lower limb, bilateral: Secondary | ICD-10-CM | POA: Diagnosis present

## 2020-07-23 LAB — COMPREHENSIVE METABOLIC PANEL
ALT: 13 U/L (ref 0–44)
AST: 17 U/L (ref 15–41)
Albumin: 3.5 g/dL (ref 3.5–5.0)
Alkaline Phosphatase: 70 U/L (ref 38–126)
Anion gap: 7 (ref 5–15)
BUN: 22 mg/dL (ref 8–23)
CO2: 26 mmol/L (ref 22–32)
Calcium: 8.4 mg/dL — ABNORMAL LOW (ref 8.9–10.3)
Chloride: 96 mmol/L — ABNORMAL LOW (ref 98–111)
Creatinine, Ser: 1.42 mg/dL — ABNORMAL HIGH (ref 0.61–1.24)
GFR, Estimated: 52 mL/min — ABNORMAL LOW (ref >60)
Glucose, Bld: 103 mg/dL — ABNORMAL HIGH (ref 70–99)
Potassium: 3.8 mmol/L (ref 3.5–5.1)
Sodium: 129 mmol/L — ABNORMAL LOW (ref 135–145)
Total Bilirubin: 0.6 mg/dL (ref 0.3–1.2)
Total Protein: 6.4 g/dL — ABNORMAL LOW (ref 6.5–8.1)

## 2020-07-23 LAB — CBC WITH DIFFERENTIAL/PLATELET
Abs Immature Granulocytes: 0.02 10*3/uL (ref 0.00–0.07)
Basophils Absolute: 0 10*3/uL (ref 0.0–0.1)
Basophils Relative: 1 %
Eosinophils Absolute: 0.3 10*3/uL (ref 0.0–0.5)
Eosinophils Relative: 5 %
HCT: 31.6 % — ABNORMAL LOW (ref 39.0–52.0)
Hemoglobin: 10.2 g/dL — ABNORMAL LOW (ref 13.0–17.0)
Immature Granulocytes: 0 %
Lymphocytes Relative: 19 %
Lymphs Abs: 1.1 10*3/uL (ref 0.7–4.0)
MCH: 25.8 pg — ABNORMAL LOW (ref 26.0–34.0)
MCHC: 32.3 g/dL (ref 30.0–36.0)
MCV: 80 fL (ref 80.0–100.0)
Monocytes Absolute: 0.6 10*3/uL (ref 0.1–1.0)
Monocytes Relative: 10 %
Neutro Abs: 3.6 10*3/uL (ref 1.7–7.7)
Neutrophils Relative %: 65 %
Platelets: 277 10*3/uL (ref 150–400)
RBC: 3.95 MIL/uL — ABNORMAL LOW (ref 4.22–5.81)
RDW: 15.1 % (ref 11.5–15.5)
WBC: 5.5 10*3/uL (ref 4.0–10.5)
nRBC: 0 % (ref 0.0–0.2)

## 2020-07-23 LAB — BRAIN NATRIURETIC PEPTIDE: B Natriuretic Peptide: 232 pg/mL — ABNORMAL HIGH (ref 0.0–100.0)

## 2020-07-23 MED ORDER — OXYCODONE-ACETAMINOPHEN 5-325 MG PO TABS
1.0000 | ORAL_TABLET | Freq: Once | ORAL | Status: AC
  Administered 2020-07-23: 1 via ORAL
  Filled 2020-07-23: qty 1

## 2020-07-23 MED ORDER — FUROSEMIDE 10 MG/ML IJ SOLN
40.0000 mg | Freq: Once | INTRAMUSCULAR | Status: AC
  Administered 2020-07-23: 40 mg via INTRAVENOUS
  Filled 2020-07-23: qty 4

## 2020-07-23 NOTE — ED Provider Notes (Signed)
[facility] EMERGENCY DEPARTMENT Provider Note   CSN: [id number] Arrival date & time: 07/23/20  0865     History Chief Complaint  Patient presents with  . Leg Swelling    Kenneth Rhodes is a 75 y.o. male.  Patient complains of significant swelling in both of his legs.  He is supposed to take Lasix 40 mg a day but only takes it when he feels like he has a lot of swelling.  The history is provided by the patient and medical records. No language interpreter was used.  Weakness Severity:  Mild Onset quality:  Unable to specify Timing:  Constant Progression:  Unchanged Chronicity:  Recurrent Context: not alcohol use   Relieved by:  Nothing Worsened by:  Nothing Ineffective treatments:  None tried Associated symptoms: no abdominal pain, no chest pain, no cough, no diarrhea, no frequency, no headaches and no seizures        Past Medical History:  Diagnosis Date  . Arthritis   . Bladder spasms   . Blood transfusion without reported diagnosis   . BPH (benign prostatic hypertrophy)   . C. difficile diarrhea   . Chronic hepatitis C ([place])   . Depression   . GERD (gastroesophageal reflux disease)   . Hepatitis C   . Hyperlipidemia   . Hypertension   . Hypothyroidism   . Liver disease   . MRSA (methicillin resistant staph aureus) culture positive   . Neurogenic bladder   . Obesity   . Sleep apnea   . Substance abuse ([place])    pain medication    Patient Active Problem List   Diagnosis Date Noted  . AKI (acute kidney injury) ([place]) 07/16/2020  . Chronic diastolic CHF (congestive heart failure) ([place]) 07/16/2020  . Vomiting 07/16/2020  . Paraplegia ([place]) 07/16/2020  . Acute CHF (congestive heart failure) ([place]) 02/14/2018  . Hypertension 02/14/2018  . Hyponatremia 02/14/2018  . GAD (generalized anxiety disorder) 01/19/2015  . Lower abdominal pain 10/22/2014  . Diarrhea 10/22/2014  . Hepatitis C 10/17/2014  . Testosterone deficiency 06/09/2014  . Vitamin D  deficiency 06/09/2014  . Opiate addiction ([place]) 03/26/2014  . Opiate withdrawal ([place]) 03/26/2014  . Depression, major, recurrent ([place]) 03/26/2014  . Suicidal ideation 03/26/2014  . Chronic pain 03/26/2014  . Lumbar radiculopathy, chronic 03/26/2014  . Hypothyroidism 03/26/2014  . BPH (benign prostatic hyperplasia) 03/26/2014  . Bladder spasm 03/26/2014  . Opioid dependence with opioid-induced mood disorder [facility])     Past Surgical History:  Procedure Laterality Date  . COLONOSCOPY     ovr 10 yrs ago in [place]. pt said normal exam  . [place] SURGERY     2008, 2015, 2017  . LUMBAR EPIDURAL INJECTION    . MEDIAL PARTIAL KNEE REPLACEMENT Right   . SPINE SURGERY     due to mrsa  . SUPRAPUBIC CATHETER INSERTION         Family History  Problem Relation Age of Onset  . Ovarian cancer Mother   . Colon cancer Mother        dx age 58  . AAA (abdominal aortic aneurysm) Father   . Esophageal cancer Neg Hx   . Kidney disease Neg Hx   . Liver disease Neg Hx     Social History   Tobacco Use  . Smoking status: Former Smoker    Types: Cigarettes    Quit date: 03/21/1994    Years since quitting: 26.3  . Smokeless tobacco: Never Used  Substance Use Topics  .  Alcohol use: No    Alcohol/week: 0.0 standard drinks  . Drug use: No    Comment: heroin- pulled forward from previoius visit, pt denies drug use.    Home Medications Prior to Admission medications   Medication Sig Start Date End Date Taking? Authorizing Provider  acetaminophen (TYLENOL) 325 MG tablet Take 650 mg by mouth every 4 (four) hours as needed.    Yes [provider]  cyclobenzaprine (FLEXERIL) 5 MG tablet Take 1 tablet (5 mg total) by mouth 3 (three) times daily as needed for muscle spasms. 07/17/20  Yes Kathie Dike, MD  levothyroxine (SYNTHROID, LEVOTHROID) 125 MCG tablet [place] BREAKFAST Patient taking differently: Take by mouth daily. 04/26/16  Yes Wardell Honour,  MD  lisinopril (ZESTRIL) 20 MG tablet Take 20 mg by mouth daily.   Yes [provider]  omeprazole (PRILOSEC) 40 MG capsule Take 40 mg by mouth 2 (two) times daily.   Yes [provider]    Allergies    Hydrocodone  Review of Systems   Review of Systems  Constitutional: Negative for appetite change and fatigue.  HENT: Negative for congestion, ear discharge and sinus pressure.   Eyes: Negative for discharge.  Respiratory: Negative for cough.   Cardiovascular: Negative for chest pain.  Gastrointestinal: Negative for abdominal pain and diarrhea.  Genitourinary: Negative for frequency and hematuria.  Musculoskeletal: Negative for back pain.       Swelling in legs  Skin: Negative for rash.  Neurological: Positive for weakness. Negative for seizures and headaches.  Psychiatric/Behavioral: Negative for hallucinations.    Physical Exam Updated Vital Signs BP 99/62   Pulse 79   Temp 97.8 F (36.6 C)   Resp 12   Ht 6\' 1"  (1.854 m)   Wt 124.7 kg   SpO2 95%   BMI 36.28 kg/m   Physical Exam Constitutional:      Appearance: He is well-developed.  HENT:     Head: Normocephalic.  Eyes:     General: No scleral icterus.    Conjunctiva/sclera: Conjunctivae normal.  Neck:     Thyroid: No thyromegaly.  Cardiovascular:     Rate and Rhythm: Normal rate and regular rhythm.     Heart sounds: No murmur heard. No friction rub. No gallop.   Pulmonary:     Breath sounds: No stridor. No wheezing or rales.  Chest:     Chest wall: No tenderness.  Abdominal:     General: There is no distension.     Tenderness: There is no abdominal tenderness. There is no rebound.  Musculoskeletal:        General: Normal range of motion.     Cervical back: Neck supple.     Comments: 2+ edema into his thighs  Lymphadenopathy:     Cervical: No cervical adenopathy.  Skin:    Findings: No erythema or rash.  Neurological:     Mental Status: He is oriented to person, place, and time.      Motor: No abnormal muscle tone.     Coordination: Coordination normal.  Psychiatric:        Behavior: Behavior normal.     ED Results / Procedures / Treatments   Labs (all labs ordered are listed, but only abnormal results are displayed) Labs Reviewed  CBC WITH DIFFERENTIAL/PLATELET - Abnormal; Notable for the following components:      Result Value   RBC 3.95 (*)    Hemoglobin 10.2 (*)    HCT  31.6 (*)    MCH 25.8 (*)    All other components within normal limits  COMPREHENSIVE METABOLIC PANEL - Abnormal; Notable for the following components:   Sodium 129 (*)    Chloride 96 (*)    Glucose, Bld 103 (*)    Creatinine, Ser 1.42 (*)    Calcium 8.4 (*)    Total Protein 6.4 (*)    GFR, Estimated 52 (*)    All other components within normal limits  BRAIN NATRIURETIC PEPTIDE - Abnormal; Notable for the following components:   B Natriuretic Peptide 232.0 (*)    All other components within normal limits    EKG None  Radiology DG Chest Port 1 View  Result Date: 07/23/2020 CLINICAL DATA:  Shortness of breath bilateral leg swelling for several days. Decreased urine output. Ex-smoker. EXAM: PORTABLE CHEST 1 VIEW COMPARISON:  07/16/2020 FINDINGS: Poor inspiration. Borderline enlarged cardiac silhouette with a mild increase size, accentuated by the poor inspiration and portable AP technique. The pulmonary vasculature remains mildly prominent. Clear lungs. Right shoulder prosthesis and thoracolumbar spine fixation hardware. IMPRESSION: No acute abnormality. Borderline cardiomegaly and chronic mild pulmonary vascular congestion. Electronically Signed   By: Claudie Revering M.D.   On: 07/23/2020 10:33    Procedures Procedures   Medications Ordered in ED Medications  oxyCODONE-acetaminophen (PERCOCET/ROXICET) 5-325 MG per tablet 1 tablet (1 tablet Oral Given 07/23/20 0953)  furosemide (LASIX) injection 40 mg (40 mg Intravenous Given 07/23/20 1101)    ED Course  I have reviewed the triage vital  signs and the nursing notes.  Pertinent labs & imaging results that were available during my care of the patient were reviewed by me and considered in my medical decision making (see chart for details). Patient was given Lasix and diuresed a significant amount.   MDM Rules/Calculators/A&P                          Significant edema to the lower legs.  Patient was given Lasix and had significant urine output.  He will be placed on Lasix 40 mg twice a day and follow-up with his PCP next week Final Clinical Impression(s) / ED Diagnoses Final diagnoses:  Peripheral edema    Rx / DC Orders ED Discharge Orders    None       Milton Ferguson, MD 07/23/20 1734

## 2020-07-23 NOTE — ED Triage Notes (Signed)
Pt c/o bilateral leg swelling for the past few days. Also c/o minimal urinary output even with self cath.

## 2020-07-23 NOTE — Discharge Instructions (Addendum)
Increase your Lasix so you are taking 40 mg twice a day for the next week.  Follow-up with your family doctor next week

## 2021-07-23 ENCOUNTER — Encounter: Payer: Self-pay | Admitting: *Deleted

## 2021-07-26 ENCOUNTER — Encounter: Payer: Self-pay | Admitting: Cardiology

## 2021-07-26 ENCOUNTER — Encounter: Payer: Self-pay | Admitting: *Deleted

## 2021-07-26 ENCOUNTER — Ambulatory Visit: Payer: Medicare Other | Admitting: Cardiology

## 2021-07-26 VITALS — BP 124/74 | HR 84 | Ht 73.0 in | Wt 279.8 lb

## 2021-07-26 DIAGNOSIS — I4819 Other persistent atrial fibrillation: Secondary | ICD-10-CM | POA: Diagnosis not present

## 2021-07-26 DIAGNOSIS — I5032 Chronic diastolic (congestive) heart failure: Secondary | ICD-10-CM | POA: Diagnosis not present

## 2021-07-26 DIAGNOSIS — I10 Essential (primary) hypertension: Secondary | ICD-10-CM | POA: Diagnosis not present

## 2021-07-26 DIAGNOSIS — Z79899 Other long term (current) drug therapy: Secondary | ICD-10-CM

## 2021-07-26 MED ORDER — APIXABAN 5 MG PO TABS: 5.0000 mg | ORAL_TABLET | Freq: Two times a day (BID) | ORAL | 6 refills | Status: AC

## 2021-07-26 NOTE — Progress Notes (Signed)
? ? ? ?Clinical Summary ?Mr. Glomski is a 76 y.o.male seen today as a new consult, referred by [facility] for the following medical problems.  ? ? ?1.Chronic diastolic HF ?- admit [facility] 05/2021 ?05/2021 echo LVEF >55%, mod LAE, diastolic indet ?- home weight 278 lbs, prior to admit 329 lbs. Since discharge slow trending down in weight. Taking lasix '40mg'$  bid ?- he is on jardiance ? ? ? ?2. Persistent afib ?- was diagnosed during 05/2021 admission ?- no palpitations ?- no bleeding on eliquis. ?- he reports pcp had stopped his lopressor at recent f/u, indication unclear ? ? ? ? ?3. Anemia ?- - Hgb 9 at discharge.  ? ? ?4.History of paraplegia secondary to spinal cord infection ? ?Past Medical History:  ?Diagnosis Date  ? Anemia   ? Arthritis   ? Atrial fibrillation ([place])   ? Bladder spasms   ? Blood transfusion without reported diagnosis   ? BPH (benign prostatic hypertrophy)   ? C. difficile diarrhea   ? CHF (congestive heart failure) ([place])   ? Chronic hepatitis C ([place])   ? Chronic pain   ? Depression   ? GERD (gastroesophageal reflux disease)   ? Hepatitis C   ? Hyperlipidemia   ? Hypertension   ? Hyponatremia   ? Hypothyroidism   ? Liver disease   ? MRSA (methicillin resistant staph aureus) culture positive   ? Neurogenic bladder   ? Obesity   ? Sleep apnea   ? Substance abuse ([place])   ? pain medication  ? ? ? ?Allergies  ?Allergen Reactions  ? Hydrocodone Nausea Only  ? ? ? ?Current Outpatient Medications  ?Medication Sig Dispense Refill  ? acetaminophen (TYLENOL) 325 MG tablet Take 650 mg by mouth every 6 (six) hours as needed.    ? apixaban (ELIQUIS) 5 MG TABS tablet Take 1 tablet by mouth 2 (two) times daily.    ? cyclobenzaprine (FLEXERIL) 5 MG tablet Take 1 tablet (5 mg total) by mouth 3 (three) times daily as needed for muscle spasms. 30 tablet 0  ? empagliflozin (JARDIANCE) 10 MG TABS tablet Take 1 tablet by mouth daily.    ? fentaNYL (DURAGESIC) 25 MCG/HR Place 1 patch onto the skin every 3 (three) days.     ? furosemide (LASIX) 40 MG tablet Take 40 mg by mouth.    ? levothyroxine (SYNTHROID) 50 MCG tablet Take 50 mcg by mouth daily before breakfast.    ? levothyroxine (SYNTHROID) 75 MCG tablet Take 1 tablet by mouth daily.    ? lisinopril (ZESTRIL) 20 MG tablet Take 20 mg by mouth daily.    ? methocarbamol (ROBAXIN) 500 MG tablet Take by mouth.    ? metoprolol tartrate (LOPRESSOR) 25 MG tablet Take 0.5 mg by mouth 2 (two) times daily.    ? naloxone (NARCAN) nasal spray 4 mg/0.1 mL Place 1 spray into the nose as needed.    ? omeprazole (PRILOSEC) 40 MG capsule Take 40 mg by mouth 2 (two) times daily.    ? oxyCODONE ER (XTAMPZA ER) 13.5 MG C12A Take 1 tablet by mouth every 12 (twelve) hours.    ? pantoprazole (PROTONIX) 40 MG tablet Take 40 mg by mouth daily.    ? polyethylene glycol (MIRALAX / GLYCOLAX) 17 g packet Take 17 g by mouth daily.    ? tamsulosin (FLOMAX) 0.4 MG CAPS capsule Take 0.4 mg by mouth daily.    ? ?No current facility-administered medications for this visit.  ? ? ? ?  Past Surgical History:  ?Procedure Laterality Date  ? COLONOSCOPY    ? ovr 10 yrs ago in [place]. pt said normal exam  ? LUMBAR DISC SURGERY    ? 2008, 2015, 2017  ? LUMBAR EPIDURAL INJECTION    ? MEDIAL PARTIAL KNEE REPLACEMENT Right   ? SPINE SURGERY    ? due to mrsa  ? SUPRAPUBIC CATHETER INSERTION    ? ? ? ?Allergies  ?Allergen Reactions  ? Hydrocodone Nausea Only  ? ? ? ? ?Family History  ?Problem Relation Age of Onset  ? Ovarian cancer Mother   ? Colon cancer Mother   ?     dx age 64  ? AAA (abdominal aortic aneurysm) Father   ? Esophageal cancer Neg Hx   ? Kidney disease Neg Hx   ? Liver disease Neg Hx   ? ? ? ?Social History ?Mr. Pellegrino reports that he quit smoking about 27 years ago. His smoking use included cigarettes. He has never used smokeless tobacco. ?Mr. Salas reports no history of alcohol use. ? ? ?Review of Systems ?CONSTITUTIONAL: No weight loss, fever, chills, weakness or fatigue.  ?HEENT: Eyes: No visual  loss, blurred vision, double vision or yellow sclerae.No hearing loss, sneezing, congestion, runny nose or sore throat.  ?SKIN: No rash or itching.  ?CARDIOVASCULAR: per hpi ?RESPIRATORY: No shortness of breath, cough or sputum.  ?GASTROINTESTINAL: No anorexia, nausea, vomiting or diarrhea. No abdominal pain or blood.  ?GENITOURINARY: No burning on urination, no polyuria ?NEUROLOGICAL: No headache, dizziness, syncope, paralysis, ataxia, numbness or tingling in the extremities. No change in bowel or bladder control.  ?MUSCULOSKELETAL: No muscle, back pain, joint pain or stiffness.  ?LYMPHATICS: No enlarged nodes. No history of splenectomy.  ?PSYCHIATRIC: No history of depression or anxiety.  ?ENDOCRINOLOGIC: No reports of sweating, cold or heat intolerance. No polyuria or polydipsia.  ?. ? ? ?Physical Examination ?Today's Vitals  ? 07/26/21 1030  ?BP: 124/74  ?Pulse: 84  ?SpO2: 96%  ?Weight: 279 lb 12.8 oz (126.9 kg)  ?Height: '6\' 1"'$  (1.854 m)  ? ?Body mass index is 36.92 kg/m?. ? ?Gen: resting comfortably, no acute distress ?HEENT: no scleral icterus, pupils equal round and reactive, no palptable cervical adenopathy,  ?CV: irreg, no m/rg no jvd ?Resp: Clear to auscultation bilaterally ?GI: abdomen is soft, non-tender, non-distended, normal bowel sounds, no hepatosplenomegaly ?MSK: extremities are warm, no edema.  ?Skin: warm, no rash ?Neuro:  no focal deficits ?Psych: appropriate affect ? ? ? ? ?Assessment and Plan  ?1.Chronic diastolic HF ?- has done well since recent admission with volume overload, stable home weights that have trended down further since discharge ?- recheck bmet/mg, contiue oral lasix ? ?2. Persistent afib/acquired thrombophilia ?- request records from pcp, he reports was told to stop his lopressor. HR high 90s today, would restart unless clear contraindication, will review pcp note ?- continue eliquis ? ? ? ? ? ? ? ?Arnoldo Lenis, M.D. ?

## 2021-07-26 NOTE — Patient Instructions (Addendum)
Medication Instructions:  ?Your physician recommends that you continue on your current medications as directed. Please refer to the Current Medication list given to you today. ? ?Labwork: ?BMET, MG, CBC-today at [facility] ? ?Testing/Procedures: ?none ? ?Follow-Up: ?Your physician recommends that you schedule a follow-up appointment in: 6 months ? ?Any Other Special Instructions Will Be Listed Below (If Applicable). ? ?If you need a refill on your cardiac medications before your next appointment, please call your pharmacy. ?

## 2021-09-09 ENCOUNTER — Emergency Department (HOSPITAL_COMMUNITY): Payer: Medicare Other

## 2021-09-09 ENCOUNTER — Other Ambulatory Visit: Payer: Self-pay

## 2021-09-09 ENCOUNTER — Inpatient Hospital Stay (HOSPITAL_COMMUNITY)
Admission: EM | Admit: 2021-09-09 | Discharge: 2021-09-13 | DRG: 551 | Disposition: A | Discharge status: Skilled Nursing Facility | Payer: Medicare Other | Attending: Family Medicine | Admitting: Family Medicine

## 2021-09-09 ENCOUNTER — Encounter (HOSPITAL_COMMUNITY): Payer: Self-pay

## 2021-09-09 DIAGNOSIS — I5032 Chronic diastolic (congestive) heart failure: Secondary | ICD-10-CM | POA: Diagnosis present

## 2021-09-09 DIAGNOSIS — Z7901 Long term (current) use of anticoagulants: Secondary | ICD-10-CM

## 2021-09-09 DIAGNOSIS — R531 Weakness: Secondary | ICD-10-CM

## 2021-09-09 DIAGNOSIS — Z981 Arthrodesis status: Secondary | ICD-10-CM

## 2021-09-09 DIAGNOSIS — I110 Hypertensive heart disease with heart failure: Secondary | ICD-10-CM | POA: Diagnosis present

## 2021-09-09 DIAGNOSIS — Z9181 History of falling: Secondary | ICD-10-CM

## 2021-09-09 DIAGNOSIS — R52 Pain, unspecified: Secondary | ICD-10-CM | POA: Diagnosis present

## 2021-09-09 DIAGNOSIS — G8929 Other chronic pain: Secondary | ICD-10-CM | POA: Diagnosis not present

## 2021-09-09 DIAGNOSIS — F1124 Opioid dependence with opioid-induced mood disorder: Secondary | ICD-10-CM | POA: Diagnosis present

## 2021-09-09 DIAGNOSIS — E876 Hypokalemia: Secondary | ICD-10-CM | POA: Diagnosis present

## 2021-09-09 DIAGNOSIS — Y92003 Bedroom of unspecified non-institutional (private) residence as the place of occurrence of the external cause: Secondary | ICD-10-CM

## 2021-09-09 DIAGNOSIS — F1120 Opioid dependence, uncomplicated: Secondary | ICD-10-CM | POA: Diagnosis present

## 2021-09-09 DIAGNOSIS — E785 Hyperlipidemia, unspecified: Secondary | ICD-10-CM | POA: Diagnosis present

## 2021-09-09 DIAGNOSIS — D6859 Other primary thrombophilia: Secondary | ICD-10-CM | POA: Diagnosis present

## 2021-09-09 DIAGNOSIS — W1830XA Fall on same level, unspecified, initial encounter: Secondary | ICD-10-CM | POA: Diagnosis present

## 2021-09-09 DIAGNOSIS — I4891 Unspecified atrial fibrillation: Secondary | ICD-10-CM | POA: Diagnosis not present

## 2021-09-09 DIAGNOSIS — B182 Chronic viral hepatitis C: Secondary | ICD-10-CM | POA: Diagnosis present

## 2021-09-09 DIAGNOSIS — M4804 Spinal stenosis, thoracic region: Principal | ICD-10-CM | POA: Diagnosis present

## 2021-09-09 DIAGNOSIS — E559 Vitamin D deficiency, unspecified: Secondary | ICD-10-CM | POA: Diagnosis present

## 2021-09-09 DIAGNOSIS — G9520 Unspecified cord compression: Secondary | ICD-10-CM | POA: Diagnosis present

## 2021-09-09 DIAGNOSIS — Z7401 Bed confinement status: Secondary | ICD-10-CM

## 2021-09-09 DIAGNOSIS — Y92009 Unspecified place in unspecified non-institutional (private) residence as the place of occurrence of the external cause: Secondary | ICD-10-CM

## 2021-09-09 DIAGNOSIS — Z6835 Body mass index (BMI) 35.0-35.9, adult: Secondary | ICD-10-CM

## 2021-09-09 DIAGNOSIS — W19XXXA Unspecified fall, initial encounter: Secondary | ICD-10-CM

## 2021-09-09 DIAGNOSIS — R532 Functional quadriplegia: Secondary | ICD-10-CM | POA: Diagnosis present

## 2021-09-09 DIAGNOSIS — T148XXA Other injury of unspecified body region, initial encounter: Secondary | ICD-10-CM | POA: Diagnosis present

## 2021-09-09 DIAGNOSIS — E039 Hypothyroidism, unspecified: Secondary | ICD-10-CM | POA: Diagnosis present

## 2021-09-09 DIAGNOSIS — R29898 Other symptoms and signs involving the musculoskeletal system: Secondary | ICD-10-CM | POA: Diagnosis present

## 2021-09-09 DIAGNOSIS — R262 Difficulty in walking, not elsewhere classified: Secondary | ICD-10-CM | POA: Diagnosis not present

## 2021-09-09 DIAGNOSIS — E349 Endocrine disorder, unspecified: Secondary | ICD-10-CM | POA: Diagnosis present

## 2021-09-09 DIAGNOSIS — N400 Enlarged prostate without lower urinary tract symptoms: Secondary | ICD-10-CM | POA: Diagnosis present

## 2021-09-09 DIAGNOSIS — F411 Generalized anxiety disorder: Secondary | ICD-10-CM | POA: Diagnosis present

## 2021-09-09 DIAGNOSIS — K219 Gastro-esophageal reflux disease without esophagitis: Secondary | ICD-10-CM | POA: Diagnosis present

## 2021-09-09 DIAGNOSIS — G4730 Sleep apnea, unspecified: Secondary | ICD-10-CM | POA: Diagnosis present

## 2021-09-09 DIAGNOSIS — E669 Obesity, unspecified: Secondary | ICD-10-CM | POA: Diagnosis present

## 2021-09-09 DIAGNOSIS — S20211A Contusion of right front wall of thorax, initial encounter: Secondary | ICD-10-CM | POA: Diagnosis present

## 2021-09-09 DIAGNOSIS — I7121 Aneurysm of the ascending aorta, without rupture: Secondary | ICD-10-CM | POA: Diagnosis present

## 2021-09-09 DIAGNOSIS — Z885 Allergy status to narcotic agent status: Secondary | ICD-10-CM

## 2021-09-09 DIAGNOSIS — Z79899 Other long term (current) drug therapy: Secondary | ICD-10-CM

## 2021-09-09 DIAGNOSIS — Z96651 Presence of right artificial knee joint: Secondary | ICD-10-CM | POA: Diagnosis present

## 2021-09-09 DIAGNOSIS — Z87891 Personal history of nicotine dependence: Secondary | ICD-10-CM

## 2021-09-09 DIAGNOSIS — D6869 Other thrombophilia: Secondary | ICD-10-CM | POA: Diagnosis present

## 2021-09-09 DIAGNOSIS — F339 Major depressive disorder, recurrent, unspecified: Secondary | ICD-10-CM | POA: Diagnosis present

## 2021-09-09 DIAGNOSIS — M545 Low back pain, unspecified: Principal | ICD-10-CM

## 2021-09-09 DIAGNOSIS — Z79890 Hormone replacement therapy (postmenopausal): Secondary | ICD-10-CM

## 2021-09-09 LAB — CBC WITH DIFFERENTIAL/PLATELET
Abs Immature Granulocytes: 0.02 10*3/uL (ref 0.00–0.07)
Basophils Absolute: 0.1 10*3/uL (ref 0.0–0.1)
Basophils Relative: 1 %
Eosinophils Absolute: 1.7 10*3/uL — ABNORMAL HIGH (ref 0.0–0.5)
Eosinophils Relative: 20 %
HCT: 33.8 % — ABNORMAL LOW (ref 39.0–52.0)
Hemoglobin: 10.6 g/dL — ABNORMAL LOW (ref 13.0–17.0)
Immature Granulocytes: 0 %
Lymphocytes Relative: 13 %
Lymphs Abs: 1.1 10*3/uL (ref 0.7–4.0)
MCH: 22.9 pg — ABNORMAL LOW (ref 26.0–34.0)
MCHC: 31.4 g/dL (ref 30.0–36.0)
MCV: 73.2 fL — ABNORMAL LOW (ref 80.0–100.0)
Monocytes Absolute: 0.5 10*3/uL (ref 0.1–1.0)
Monocytes Relative: 6 %
Neutro Abs: 5.1 10*3/uL (ref 1.7–7.7)
Neutrophils Relative %: 60 %
Platelets: 170 10*3/uL (ref 150–400)
RBC: 4.62 MIL/uL (ref 4.22–5.81)
RDW: 20.7 % — ABNORMAL HIGH (ref 11.5–15.5)
WBC: 8.4 10*3/uL (ref 4.0–10.5)
nRBC: 0 % (ref 0.0–0.2)

## 2021-09-09 LAB — URINALYSIS, ROUTINE W REFLEX MICROSCOPIC
Bilirubin Urine: NEGATIVE
Glucose, UA: NEGATIVE mg/dL
Hgb urine dipstick: NEGATIVE
Ketones, ur: 80 mg/dL — AB
Leukocytes,Ua: NEGATIVE
Nitrite: NEGATIVE
Protein, ur: 30 mg/dL — AB
Specific Gravity, Urine: 1.021 (ref 1.005–1.030)
pH: 7 (ref 5.0–8.0)

## 2021-09-09 LAB — COMPREHENSIVE METABOLIC PANEL
ALT: 9 U/L (ref 0–44)
AST: 11 U/L — ABNORMAL LOW (ref 15–41)
Albumin: 3.4 g/dL — ABNORMAL LOW (ref 3.5–5.0)
Alkaline Phosphatase: 92 U/L (ref 38–126)
Anion gap: 8 (ref 5–15)
BUN: 11 mg/dL (ref 8–23)
CO2: 22 mmol/L (ref 22–32)
Calcium: 8.8 mg/dL — ABNORMAL LOW (ref 8.9–10.3)
Chloride: 111 mmol/L (ref 98–111)
Creatinine, Ser: 0.79 mg/dL (ref 0.61–1.24)
GFR, Estimated: >60 mL/min (ref >60)
Glucose, Bld: 93 mg/dL (ref 70–99)
Potassium: 3.4 mmol/L — ABNORMAL LOW (ref 3.5–5.1)
Sodium: 141 mmol/L (ref 135–145)
Total Bilirubin: 0.8 mg/dL (ref 0.3–1.2)
Total Protein: 6.8 g/dL (ref 6.5–8.1)

## 2021-09-09 LAB — VITAMIN D 25 HYDROXY (VIT D DEFICIENCY, FRACTURES): Vit D, 25-Hydroxy: 9.81 ng/mL — ABNORMAL LOW (ref 30–100)

## 2021-09-09 LAB — CK: Total CK: 55 U/L (ref 49–397)

## 2021-09-09 LAB — MAGNESIUM: Magnesium: 2 mg/dL (ref 1.7–2.4)

## 2021-09-09 LAB — BRAIN NATRIURETIC PEPTIDE: B Natriuretic Peptide: 238 pg/mL — ABNORMAL HIGH (ref 0.0–100.0)

## 2021-09-09 MED ORDER — FUROSEMIDE 40 MG PO TABS
40.0000 mg | ORAL_TABLET | Freq: Every day | ORAL | Status: DC
  Administered 2021-09-10 – 2021-09-13 (×4): 40 mg via ORAL
  Filled 2021-09-09 (×4): qty 1

## 2021-09-09 MED ORDER — IPRATROPIUM-ALBUTEROL 0.5-2.5 (3) MG/3ML IN SOLN: 3.0000 mL | RESPIRATORY_TRACT | Status: DC | PRN

## 2021-09-09 MED ORDER — METOPROLOL TARTRATE 25 MG PO TABS
12.5000 mg | ORAL_TABLET | Freq: Two times a day (BID) | ORAL | Status: DC
  Administered 2021-09-09 – 2021-09-13 (×8): 12.5 mg via ORAL
  Filled 2021-09-09 (×8): qty 1

## 2021-09-09 MED ORDER — METHOCARBAMOL 500 MG PO TABS
500.0000 mg | ORAL_TABLET | Freq: Three times a day (TID) | ORAL | Status: DC | PRN
Start: 2021-09-09 — End: 2021-09-13
  Administered 2021-09-09 – 2021-09-13 (×5): 500 mg via ORAL
  Filled 2021-09-09 (×5): qty 1

## 2021-09-09 MED ORDER — SODIUM CHLORIDE 0.9 % IV BOLUS
500.0000 mL | Freq: Once | INTRAVENOUS | Status: AC
  Administered 2021-09-09: 500 mL via INTRAVENOUS

## 2021-09-09 MED ORDER — METOPROLOL TARTRATE 25 MG PO TABS: 12.5000 mg | ORAL_TABLET | Freq: Two times a day (BID) | ORAL | Status: DC

## 2021-09-09 MED ORDER — ONDANSETRON HCL 4 MG PO TABS: 4.0000 mg | ORAL_TABLET | Freq: Four times a day (QID) | ORAL | Status: DC | PRN

## 2021-09-09 MED ORDER — APIXABAN 5 MG PO TABS: 5.0000 mg | ORAL_TABLET | Freq: Two times a day (BID) | ORAL | Status: DC

## 2021-09-09 MED ORDER — PANTOPRAZOLE SODIUM 40 MG PO TBEC
40.0000 mg | DELAYED_RELEASE_TABLET | Freq: Every day | ORAL | Status: DC
  Administered 2021-09-09 – 2021-09-13 (×5): 40 mg via ORAL
  Filled 2021-09-09 (×5): qty 1

## 2021-09-09 MED ORDER — MORPHINE SULFATE (PF) 4 MG/ML IV SOLN
4.0000 mg | Freq: Once | INTRAVENOUS | Status: AC
  Administered 2021-09-09: 4 mg via INTRAVENOUS
  Filled 2021-09-09: qty 1

## 2021-09-09 MED ORDER — PANTOPRAZOLE SODIUM 40 MG PO TBEC: 40.0000 mg | DELAYED_RELEASE_TABLET | Freq: Every day | ORAL | Status: DC

## 2021-09-09 MED ORDER — DOCUSATE SODIUM 100 MG PO CAPS
100.0000 mg | ORAL_CAPSULE | Freq: Two times a day (BID) | ORAL | Status: DC
  Administered 2021-09-09 – 2021-09-13 (×8): 100 mg via ORAL
  Filled 2021-09-09 (×8): qty 1

## 2021-09-09 MED ORDER — TAMSULOSIN HCL 0.4 MG PO CAPS
0.4000 mg | ORAL_CAPSULE | Freq: Every day | ORAL | Status: DC
  Administered 2021-09-09 – 2021-09-12 (×4): 0.4 mg via ORAL
  Filled 2021-09-09 (×4): qty 1

## 2021-09-09 MED ORDER — APIXABAN 5 MG PO TABS
5.0000 mg | ORAL_TABLET | Freq: Two times a day (BID) | ORAL | Status: DC
  Administered 2021-09-09 – 2021-09-13 (×8): 5 mg via ORAL
  Filled 2021-09-09 (×8): qty 1

## 2021-09-09 MED ORDER — LEVOTHYROXINE SODIUM 50 MCG PO TABS
50.0000 ug | ORAL_TABLET | Freq: Every day | ORAL | Status: DC
Start: 2021-09-10 — End: 2021-09-13
  Administered 2021-09-10 – 2021-09-13 (×4): 50 ug via ORAL
  Filled 2021-09-09 (×4): qty 1

## 2021-09-09 MED ORDER — NYSTATIN 100000 UNIT/GM EX POWD
Freq: Two times a day (BID) | CUTANEOUS | Status: DC
  Filled 2021-09-09: qty 15

## 2021-09-09 MED ORDER — OXYCODONE HCL 5 MG PO TABS
5.0000 mg | ORAL_TABLET | ORAL | Status: DC | PRN
  Administered 2021-09-09 – 2021-09-11 (×9): 10 mg via ORAL
  Filled 2021-09-09 (×2): qty 1
  Filled 2021-09-09 (×8): qty 2

## 2021-09-09 MED ORDER — ACETAMINOPHEN 325 MG PO TABS
650.0000 mg | ORAL_TABLET | Freq: Four times a day (QID) | ORAL | Status: DC
  Administered 2021-09-09 – 2021-09-12 (×12): 650 mg via ORAL
  Filled 2021-09-09 (×12): qty 2

## 2021-09-09 MED ORDER — POTASSIUM CHLORIDE CRYS ER 20 MEQ PO TBCR: 40.0000 meq | EXTENDED_RELEASE_TABLET | Freq: Once | ORAL | Status: DC

## 2021-09-09 MED ORDER — DOCUSATE SODIUM 100 MG PO CAPS: 100.0000 mg | ORAL_CAPSULE | Freq: Two times a day (BID) | ORAL | Status: DC

## 2021-09-09 MED ORDER — ONDANSETRON HCL 4 MG/2ML IJ SOLN
4.0000 mg | Freq: Four times a day (QID) | INTRAMUSCULAR | Status: DC | PRN
  Administered 2021-09-12: 4 mg via INTRAVENOUS
  Filled 2021-09-09: qty 2

## 2021-09-09 MED ORDER — ACETAMINOPHEN 650 MG RE SUPP: 650.0000 mg | Freq: Four times a day (QID) | RECTAL | Status: DC

## 2021-09-09 MED ORDER — TRAZODONE HCL 50 MG PO TABS
50.0000 mg | ORAL_TABLET | Freq: Every evening | ORAL | Status: DC | PRN
  Administered 2021-09-12: 50 mg via ORAL
  Filled 2021-09-09: qty 1

## 2021-09-09 MED ORDER — IOHEXOL 300 MG/ML  SOLN
100.0000 mL | Freq: Once | INTRAMUSCULAR | Status: AC | PRN
  Administered 2021-09-09: 100 mL via INTRAVENOUS

## 2021-09-09 MED ORDER — BISACODYL 5 MG PO TBEC
5.0000 mg | DELAYED_RELEASE_TABLET | Freq: Every day | ORAL | Status: DC | PRN
  Administered 2021-09-13: 5 mg via ORAL
  Filled 2021-09-09: qty 1

## 2021-09-09 NOTE — Assessment & Plan Note (Addendum)
-   Patient has been bedbound for the past 3 days at home - Hopefully with aggressive therapies he can get him out of bed and get him ambulatory again -- follow up MRI study, PT recommending SNF placement which we are working on  -- I spoke with neurosurgeon Dr. Maurice Small and reviewed patient's MRI with him and he said no surgical recommendation at this time but will need to follow up with him in the office. Pt can go to rehab and have PT.

## 2021-09-09 NOTE — Assessment & Plan Note (Addendum)
-   He is rate managed with oral metoprolol which is restarted and fully anticoagulated with apixaban - He is followed by Dr. Harl Bowie with cardiology service

## 2021-09-09 NOTE — Assessment & Plan Note (Signed)
-   Resume home Flomax nightly

## 2021-09-09 NOTE — Assessment & Plan Note (Signed)
-   Patient reports he is not having symptoms at this time continue to monitor

## 2021-09-09 NOTE — ED Notes (Signed)
Patient states that he has a caretaker that is supposed to come everyday to help him daily activities.  Patient states that the caregiver sent him a text message 3 days ago and says that she never showed up and never heard anything else from her.  Patient also states that he hasn't had anything to eat or drink in those 3 days.  Patient is covered in feces and urine.  Cut the patients clothes off and cleaned the patient up.

## 2021-09-09 NOTE — H&P (Signed)
History and Physical  [place] MCN:[id number] DOB: April 09, 1945 DOA: 09/09/2021  PCP: [facility]  Patient coming from: home by [facility]  Level of care: Telemetry  I have personally briefly reviewed patient's old medical records in [place]  Chief Complaint: Back pain   HPI: Kenneth Rhodes is a 76 y.o. male with medical history significant of chronic back pain, opioid dependence, chronic lower extremity weakness, he is dependent of a walker to ambulate.  He has chronic atrial fibrillation, BPH, diastolic heart failure, chronic anticoagulation with apixaban, obesity with sleep apnea who had been living home alone but having aides to assist him.  He reports that he has not seen his aide in the last 3 days.  Reports that he fell down at home 3 days ago and EMS presented to his home however they placed him in bed and did not bring him into the hospital.  He laid in the bed for the last 3 days unable to ambulate.  He has urinated in soiled himself in bed and has not had anything to eat or drink or taken any of his medications in the last 2 to 3 days due to being unable to ambulate.  He now complains of chronic back pain acutely exacerbated by recent fall.  He reports that he has bruising on the right side of his anterior chest wall.  Overall he feels generally weak but his main complaint is inability to ambulate.  His overall goal is to get back home however if he is not able to ambulate he understands that he likely is going to need some rehabilitation.  ED Course: He was evaluated in the ED and noted to have stable labs and his trauma work-up imaging was essentially unremarkable.  He is quite tender in has not been able to ambulate.  He does have strength in both legs.  His head CT did not show any acute findings.  He also had a CT of the lumbar spine.  His chest x-ray did not show any acute findings.  His pain is uncontrolled at this time.  He is being  admitted for pain management and therapy and disposition planning.  He may not be able to return home living alone.  Review of Systems: Review of Systems  Constitutional: Negative.   HENT: Negative.    Eyes: Negative.   Respiratory: Negative.    Cardiovascular: Negative.   Gastrointestinal: Negative.   Genitourinary: Negative.   Musculoskeletal:  Positive for back pain, falls, joint pain and myalgias.  Skin:  Positive for rash.  Neurological:  Positive for focal weakness and weakness. Negative for loss of consciousness.  Endo/Heme/Allergies:  Bruises/bleeds easily.  Psychiatric/Behavioral: Negative.       Past Medical History:  Diagnosis Date   Anemia    Arthritis    Atrial fibrillation ([place])    Bladder spasms    Blood transfusion without reported diagnosis    BPH (benign prostatic hypertrophy)    C. difficile diarrhea    CHF (congestive heart failure) (HCC)    Chronic hepatitis C (HCC)    Chronic pain    Depression    GERD (gastroesophageal reflux disease)    Hepatitis C    Hyperlipidemia    Hypertension    Hyponatremia    Hypothyroidism    Liver disease    MRSA (methicillin resistant staph aureus) culture positive    Neurogenic bladder    Obesity    Sleep apnea  Substance abuse ([place])    pain medication    Past Surgical History:  Procedure Laterality Date   COLONOSCOPY     ovr 10 yrs ago in [place]. pt said normal exam   LUMBAR DISC SURGERY     2008, 2015, 2017   LUMBAR EPIDURAL INJECTION     MEDIAL PARTIAL KNEE REPLACEMENT Right    SPINE SURGERY     due to [place]       reports that he quit smoking about 27 years ago. His smoking use included cigarettes. He has never used smokeless tobacco. He reports that he does not drink alcohol and does not use drugs.  Allergies  Allergen Reactions   Hydrocodone Nausea Only    Family History  Problem Relation Age of Onset   Ovarian cancer Mother    Colon cancer Mother         dx age 62   AAA (abdominal aortic aneurysm) Father    Esophageal cancer Neg Hx    Kidney disease Neg Hx    Liver disease Neg Hx     Prior to Admission medications   Medication Sig Start Date End Date Taking? Authorizing Provider  apixaban (ELIQUIS) 5 MG TABS tablet Take 1 tablet (5 mg total) by mouth 2 (two) times daily. 07/26/21  Yes Branch, Alphonse Guild, MD  docusate sodium (COLACE) 100 MG capsule Take 100 mg by mouth 2 (two) times daily as needed. 06/21/21  Yes [provider]  furosemide (LASIX) 40 MG tablet Take 40 mg by mouth 2 (two) times daily.   Yes [provider]  levothyroxine (SYNTHROID) 50 MCG tablet Take 50 mcg by mouth daily before breakfast.   Yes [provider]  methocarbamol (ROBAXIN) 500 MG tablet Take 500 mg by mouth every 8 (eight) hours as needed. 06/21/21  Yes [provider]  metoprolol tartrate (LOPRESSOR) 25 MG tablet Take 0.5 mg by mouth 2 (two) times daily. 06/21/21  Yes [provider]  naloxone (NARCAN) nasal spray 4 mg/0.1 mL Place 1 spray into the nose once.   Yes [provider]  omeprazole (PRILOSEC) 20 MG capsule Take 20 mg by mouth daily.   Yes [provider]    Physical Exam: Vitals:   09/09/21 1030 09/09/21 1301 09/09/21 1304 09/09/21 1305  BP: (!) 146/95 140/90 (!) 145/99   Pulse: 94  97 (!) 105  Resp: 14 (!) 28 (!) 23 17  Temp:      SpO2: 97%  99% 100%  Weight:      Height:        Constitutional: Obese male lying in bed awake and alert, he appears chronically ill.  NAD, calm, comfortable Eyes: PERRL, lids and conjunctivae normal ENMT: Mucous membranes are pale and dry. Posterior pharynx clear of any exudate or lesions.  poor dentition.  Neck: normal, supple, no masses, no thyromegaly Respiratory: clear to auscultation bilaterally, no wheezing, no crackles. Normal respiratory effort. No accessory muscle use.  He does have a large bruise on the anterior right lower rib cage consistent with  reported fall. Cardiovascular: normal s1, s2 sounds, no murmurs / rubs / gallops. No extremity edema. 2+ pedal pulses. No carotid bruits.  Abdomen: no tenderness, no masses palpated. No hepatosplenomegaly. Bowel sounds positive.  Musculoskeletal: no clubbing / cyanosis. No joint deformity upper and lower extremities. Good ROM, no contractures. Normal muscle tone.  Skin: no rashes, lesions, ulcers. No induration Neurologic: CN 2-12 grossly intact. Sensation intact, DTR  normal. Strength 4/5 in all 4.  Psychiatric: Normal judgment and insight. Alert and oriented x 3. Normal mood.   Labs on Admission: I have personally reviewed following labs and imaging studies  CBC: Recent Labs  Lab 09/09/21 0850  WBC 8.4  NEUTROABS 5.1  HGB 10.6*  HCT 33.8*  MCV 73.2*  PLT 841   Basic Metabolic Panel: Recent Labs  Lab 09/09/21 0850  NA 141  K 3.4*  CL 111  CO2 22  GLUCOSE 93  BUN 11  CREATININE 0.79  CALCIUM 8.8*   GFR: Estimated Creatinine Clearance: 111 mL/min (by C-G formula based on SCr of 0.79 mg/dL). Liver Function Tests: Recent Labs  Lab 09/09/21 0850  AST 11*  ALT 9  ALKPHOS 92  BILITOT 0.8  PROT 6.8  ALBUMIN 3.4*   No results for input(s): "LIPASE", "AMYLASE" in the last 168 hours. No results for input(s): "AMMONIA" in the last 168 hours. Coagulation Profile: No results for input(s): "INR", "PROTIME" in the last 168 hours. Cardiac Enzymes: Recent Labs  Lab 09/09/21 0850  CKTOTAL 55   BNP (last 3 results) No results for input(s): "PROBNP" in the last 8760 hours. HbA1C: No results for input(s): "HGBA1C" in the last 72 hours. CBG: No results for input(s): "GLUCAP" in the last 168 hours. Lipid Profile: No results for input(s): "CHOL", "HDL", "LDLCALC", "TRIG", "CHOLHDL", "LDLDIRECT" in the last 72 hours. Thyroid Function Tests: No results for input(s): "TSH", "T4TOTAL", "FREET4", "T3FREE", "THYROIDAB" in the last 72 hours. Anemia Panel: No results for input(s):  "VITAMINB12", "FOLATE", "FERRITIN", "TIBC", "IRON", "RETICCTPCT" in the last 72 hours. Urine analysis:    Component Value Date/Time   COLORURINE YELLOW 09/09/2021 0850   APPEARANCEUR CLEAR 09/09/2021 0850   LABSPEC 1.021 09/09/2021 0850   PHURINE 7.0 09/09/2021 0850   GLUCOSEU NEGATIVE 09/09/2021 0850   HGBUR NEGATIVE 09/09/2021 0850   BILIRUBINUR NEGATIVE 09/09/2021 0850   BILIRUBINUR neg 03/18/2015 1159   KETONESUR 80 (A) 09/09/2021 0850   PROTEINUR 30 (A) 09/09/2021 0850   UROBILINOGEN negative 03/18/2015 1159   UROBILINOGEN 0.2 01/14/2015 1900   NITRITE NEGATIVE 09/09/2021 0850   LEUKOCYTESUR NEGATIVE 09/09/2021 0850    Radiological Exams on Admission: CT L-SPINE NO CHARGE  Result Date: 09/09/2021 CLINICAL DATA:  Lower back pain EXAM: CT LUMBAR SPINE WITHOUT CONTRAST TECHNIQUE: Multidetector CT imaging of the lumbar spine was performed without intravenous contrast administration. Multiplanar CT image reconstructions were also generated. RADIATION DOSE REDUCTION: This exam was performed according to the departmental dose-optimization program which includes automated exposure control, adjustment of the mA and/or kV according to patient size and/or use of iterative reconstruction technique. COMPARISON:  CT 07/16/2020 FINDINGS: Segmentation: Standard Alignment: Levoconvex lumbar curvature. Vertebrae: Prior T9-L1 anterior and posterior fusion. There is pseudoarthrosis with progressive subsidence at T9-T10 with tilting of the intervertebral disc spacer. Questionable peripheral arthrodesis at T10-T11 and T11-T12. Solid arthrodesis at T12-L1. Progressive T9 pedicle screw loosening. Separate L2-S1 fusion hardware is intact without evidence of loosening. There is no evidence of acute lumbar spine fracture. Paraspinal and other soft tissues: Reported separately on CT abdomen and pelvis. Disc levels: There is multilevel endplate spurring and bony hypertrophy which may result in neural foraminal  stenosis in the lower thoracic spine. Prior posterior decompression with patent spinal canal throughout the lumbar spine. There is prominent bony spurring posteriorly at T12-L1 which likely results in moderate spinal canal stenosis, unchanged from previous exams. IMPRESSION: No evidence of acute lumbar spine fracture. Pseudoarthrosis with progressive subsidence at T9-T10 with  tilting of the intervertebral disc spacer. Progressive loosening of the T9 pedicle screws. Otherwise unchanged thoracolumbar fusion hardware at other levels. Electronically Signed   By: Maurine Simmering M.D.   On: 09/09/2021 12:11   CT Head Wo Contrast  Result Date: 09/09/2021 CLINICAL DATA:  Head trauma, minor (Age >= 65y) EXAM: CT HEAD WITHOUT CONTRAST TECHNIQUE: Contiguous axial images were obtained from the base of the skull through the vertex without intravenous contrast. RADIATION DOSE REDUCTION: This exam was performed according to the departmental dose-optimization program which includes automated exposure control, adjustment of the mA and/or kV according to patient size and/or use of iterative reconstruction technique. COMPARISON:  CT report 10/08/2020, images not retrievable at the time of this dictation FINDINGS: Brain: No evidence of acute intracranial hemorrhage or extra-axial collection.No concerning mass effect.The ventricles are normal in size.Scattered subcortical and periventricular white matter hypodensities, nonspecific but likely sequela of chronic small vessel ischemic disease.Mild cerebral atrophy Vascular: No hyperdense vessel or unexpected calcification. Skull: Negative. Sinuses/Orbits: The paranasal sinuses are predominantly clear. Mastoid air cells are clear. Orbits are unremarkable. Other: None. IMPRESSION: No acute intracranial abnormality. Mild sequela of chronic small vessel ischemic disease. Electronically Signed   By: Maurine Simmering M.D.   On: 09/09/2021 12:03   CT CHEST ABDOMEN PELVIS W CONTRAST  Result Date:  09/09/2021 CLINICAL DATA:  Abdominal trauma, blunt EXAM: CT CHEST, ABDOMEN, AND PELVIS WITH CONTRAST TECHNIQUE: Multidetector CT imaging of the chest, abdomen and pelvis was performed following the standard protocol during bolus administration of intravenous contrast. RADIATION DOSE REDUCTION: This exam was performed according to the departmental dose-optimization program which includes automated exposure control, adjustment of the mA and/or kV according to patient size and/or use of iterative reconstruction technique. CONTRAST:  14m OMNIPAQUE IOHEXOL 300 MG/ML  SOLN COMPARISON:  CT 07/16/2020. FINDINGS: CT CHEST FINDINGS Cardiovascular: Normal cardiac size.No pericardial disease.Normal size main and branch pulmonary arteries.The ascending aorta measures up to 4.2 cm (series 11, image 33). Mediastinum/Nodes: No lymphadenopathy.The thyroid is unremarkable.Esophagus is unremarkable. Lungs/Pleura: Trace respiratory secretions in the trachea. There is mild bronchial wall thickening.No focal airspace consolidation. Bibasilar hypoventilatory changes and atelectasis.No pleural effusion.No pneumothorax. Musculoskeletal: Pseudoarthrosis at T9-T10 with progressive perihardware lucency and subsidence. Progression of T9 pedicle screw loosening.Lower thoracic fusion hardware is otherwise unchanged. Subacute-chronic appearing left posterior second rib fracture. There is an anatomic right shoulder arthroplasty. Mild focal stranding along the right anterior lower chest wall and right lower back at the level of T12-L1. CT ABDOMEN PELVIS FINDINGS Hepatobiliary: No hepatic injury or perihepatic hematoma. No gallstones, gallbladder wall thickening, or biliary dilatation. Pancreas: Unremarkable. No pancreatic ductal dilatation or surrounding inflammatory changes. Spleen: No splenic injury or perisplenic hematoma. Adrenals/Urinary Tract: No adrenal hemorrhage or renal injury identified. Bladder is unremarkable. Unchanged bilateral  renal cysts. No hydronephrosis or nephrolithiasis. Stomach/Bowel: The stomach is within normal limits. No evidence of bowel obstruction. Normal appendix. Mild rectal wall thickening. Vascular/Lymphatic: No AAA.  No lymphadenopathy. Reproductive: Unremarkable. Other: Small fat containing left inguinal hernia. No ascites. No free air. No bowel containing hernia. Musculoskeletal: No acute osseous abnormality. Moderate bilateral hip osteoarthritis. Unchanged lumbar fusion hardware, intact without evidence of loosening. IMPRESSION: Subcutaneous soft tissue stranding along the right anterior lower chest and right lower back at the level of T12-L1, could represent contusion. No other evidence of acute trauma in the chest, abdomen, or pelvis. Progressive endplate subsidence at TL8-X21and loosening of the T9 pedicle screws. Spinal fusion hardware is otherwise intact. Ascending aortic aneurysm measuring up  to 4.2 cm. Recommend annual imaging followup by CTA or MRA. This recommendation follows 2010 ACCF/AHA/AATS/ACR/ASA/SCA/SCAI/SIR/STS/SVM Guidelines for the Diagnosis and Management of Patients with Thoracic Aortic Disease. Circulation. 2010; 121: X106-Y694. Aortic aneurysm NOS (ICD10-I71.9) Electronically Signed   By: Maurine Simmering M.D.   On: 09/09/2021 11:57   DG Chest Port 1 View  Result Date: 09/09/2021 CLINICAL DATA:  Chest pain EXAM: PORTABLE CHEST 1 VIEW COMPARISON:  06/14/2021 FINDINGS: Low lung volumes. Chronic interstitial changes. No pleural effusion or pneumothorax. Similar cardiomediastinal contours. Partially imaged right shoulder arthroplasty. IMPRESSION: No acute process in the chest. Electronically Signed   By: Macy Mis M.D.   On: 09/09/2021 09:07    EKG: Independently reviewed.   Assessment/Plan Principal Problem:   Inability to walk Active Problems:   Contusion   Opiate addiction (HCC)   Depression, major, recurrent (HCC)   Chronic pain   Hypothyroidism   BPH (benign prostatic  hyperplasia)   Opioid dependence with opioid-induced mood disorder (HCC)   Testosterone deficiency   Vitamin D deficiency   GAD (generalized anxiety disorder)   Chronic diastolic CHF (congestive heart failure) (HCC)   Atrial fibrillation (HCC)   Acquired thrombophilia (HCC)   Fall at home   Functional quadriplegia   Hypokalemia    Assessment and Plan: * Inability to walk - I have requested for inpatient PT OT evaluation and TOC evaluation for rehabilitation  Contusion - Secondary to fall at home, treating supportively  Hypokalemia - Oral replacement given, check magnesium, recheck BMP tomorrow morning  Functional quadriplegia - Patient has been bedbound for the past 3 days at home - Hopefully with aggressive therapies he can get him out of bed and get him ambulatory again  Fall at home - He remains very high fall risk and we have asked for a PT OT evaluation in addition to [facility] evaluation for possible SNF placement for short-term rehab  Acquired thrombophilia ([place]) - She is fully anticoagulated with apixaban which we have restarted  Atrial fibrillation ([place]) - He is rate managed with oral metoprolol which is restarted and fully anticoagulated with apixaban - He is followed by Dr. Harl Bowie with cardiology service  Chronic diastolic CHF (congestive heart failure) ([place]) - He appears compensated likely due to poor oral intake over last couple of days - Plan to resume home Lasix 40 mg once daily reduced from twice daily until his oral intake has improved to baseline  GAD (generalized anxiety disorder) - Currently stable continue to monitor closely  Vitamin D deficiency - Follow-up 25-hydroxy vitamin D level  Opioid dependence with opioid-induced mood disorder ([place]) - Patient reports he has been weaned off of the chronic opioids that he been addicted to in the past  BPH (benign prostatic hyperplasia) - Resume home Flomax nightly  Hypothyroidism - Resume home levothyroxine  before breakfast  Chronic pain - Is no longer on chronic opioid therapy, no longer on fentanyl and twice daily long-acting OxyContin - We have ordered acetaminophen every 6 hours around-the-clock in addition to OxyContin 5 to 10 mg every 6 hours as needed pain  Depression, major, recurrent ([place]) - Patient reports he is not having symptoms at this time continue to monitor  Opiate addiction ([place]) - Careful with opioids, pain management as noted  DVT prophylaxis: apixaban   Code Status: Full   Family Communication: none present   Disposition Plan: TBD  Consults called: PT/OT/TOC  Admission status: INP  Level of care: Telemetry Irwin Brakeman MD [facility] How to contact  the [facility] Attending or Consulting provider [place] or covering provider during after hours [place], for this patient?  Check the care team in [facility] and look for a) attending/consulting [facility] provider listed and b) the [facility] team listed Log into [email or web address] and use [place]'s universal password to access. If you do not have the password, please contact the hospital operator. Locate the [facility] provider you are looking for under [facility] and page to a number that you can be directly reached. If you still have difficulty reaching the provider, please page the [facility] (Director on Call) for the Hospitalists listed on amion for assistance.   If 7PM-7AM, please contact night-coverage [email or web address] Password [facility]  09/09/2021, 3:04 PM

## 2021-09-09 NOTE — ED Notes (Signed)
Pt medications held, pt does not want to take them until he has something on his stomach. Given saltine crackers and water.  Secretary called dietary for bag lunches.

## 2021-09-09 NOTE — Assessment & Plan Note (Signed)
-   I have requested for inpatient PT OT evaluation and TOC evaluation for rehabilitation

## 2021-09-09 NOTE — Assessment & Plan Note (Addendum)
-   Oral replacement given, repleted, check magnesium

## 2021-09-09 NOTE — Assessment & Plan Note (Signed)
-   Resume home levothyroxine before breakfast

## 2021-09-09 NOTE — Assessment & Plan Note (Addendum)
-   He is fully anticoagulated with apixaban which we have restarted

## 2021-09-09 NOTE — ED Provider Notes (Signed)
[facility] EMERGENCY DEPARTMENT Provider Note   CSN: [id number] Arrival date & time: 09/09/21  0831     History  Chief Complaint  Patient presents with   Back Pain    Kenneth Rhodes is a 76 y.o. male.  He has a history of chronic back issues with left him with some lower extremity weakness.  Usually uses a walker to get around.  He said he fell 3 days ago striking his right chest on a nightstand.  No loss of consciousness.  He called EMS to put him back in bed.  Since then he has been in bed unable to get up.  Complaining of significant pain to chest and low back.  Also has a caregiver that supposed to come daily and they have not come in the last 3 days, has not eaten or drank much.  Feeling generally weak.  The history is provided by the patient and the EMS personnel.  Back Pain Location:  Lumbar spine Quality:  Stabbing Pain severity:  Severe Pain is:  Same all the time Onset quality:  Sudden Duration:  3 days Timing:  Constant Progression:  Unchanged Chronicity:  Chronic Context: falling   Relieved by:  Nothing Worsened by:  Movement Ineffective treatments:  Lying down and narcotics Associated symptoms: chest pain   Associated symptoms: no abdominal pain, no dysuria, no fever and no headaches        Home Medications Prior to Admission medications   Medication Sig Start Date End Date Taking? Authorizing Provider  acetaminophen (TYLENOL) 325 MG tablet Take 650 mg by mouth every 6 (six) hours as needed.    [provider]  apixaban (ELIQUIS) 5 MG TABS tablet Take 1 tablet (5 mg total) by mouth 2 (two) times daily. 07/26/21   Arnoldo Lenis, MD  docusate sodium (COLACE) 100 MG capsule Take 100 mg by mouth 2 (two) times daily as needed. 06/21/21   [provider]  DULoxetine (CYMBALTA) 60 MG capsule Take 60 mg by mouth daily. 03/10/21   [provider]  furosemide (LASIX) 40 MG tablet Take 40 mg by mouth 2 (two) times daily.    [provider]  levothyroxine (SYNTHROID) 50 MCG tablet Take 50 mcg by mouth daily before breakfast.    [provider]  methocarbamol (ROBAXIN) 500 MG tablet Take 500 mg by mouth every 8 (eight) hours as needed. 06/21/21   [provider]  metoprolol tartrate (LOPRESSOR) 25 MG tablet Take 0.5 mg by mouth 2 (two) times daily. 06/21/21   [provider]  naloxone [facility]) nasal spray 4 mg/0.1 mL Place 1 spray into the nose as needed.    [provider]  omeprazole (PRILOSEC) 40 MG capsule Take 40 mg by mouth 2 (two) times daily.    [provider]      Allergies    Hydrocodone    Review of Systems   Review of Systems  Constitutional:  Negative for fever.  HENT:  Negative for sore throat.   Respiratory:  Negative for shortness of breath.   Cardiovascular:  Positive for chest pain.  Gastrointestinal:  Negative for abdominal pain.  Genitourinary:  Negative for dysuria.  Musculoskeletal:  Positive for back pain. Negative for neck pain.  Neurological:  Negative for headaches.    Physical Exam Updated Vital Signs BP (!) 161/88 (BP Location: Right Wrist)   Pulse 87   Temp 98.8 F (37.1 C) (Oral)   Resp 20   Ht '6\' 1"'$  (  1.854 m)   Wt 120.8 kg   SpO2 94%   BMI 35.14 kg/m  Physical Exam Vitals and nursing note reviewed.  Constitutional:      General: He is not in acute distress.    Appearance: Normal appearance. He is well-developed.  HENT:     Head: Normocephalic and atraumatic.  Eyes:     Conjunctiva/sclera: Conjunctivae normal.  Cardiovascular:     Rate and Rhythm: Normal rate and regular rhythm.     Heart sounds: No murmur heard.    Comments: He has significant bruising right anterior lateral chest.  No crepitus. Pulmonary:     Effort: Pulmonary effort is normal. No respiratory distress.     Breath sounds: Normal breath sounds.  Abdominal:     Palpations: Abdomen is soft.     Tenderness: There is no abdominal tenderness. There is no  guarding or rebound.  Musculoskeletal:        General: Tenderness present. Normal range of motion.     Cervical back: Neck supple.     Right lower leg: Edema present.     Left lower leg: Edema present.     Comments: He has some diffuse tenderness of his lumbar spine.  There are some bruising right paralumbar.  No step-offs.  Skin:    General: Skin is warm and dry.     Capillary Refill: Capillary refill takes less than 2 seconds.  Neurological:     General: No focal deficit present.     Mental Status: He is alert.     ED Results / Procedures / Treatments   Labs (all labs ordered are listed, but only abnormal results are displayed) Labs Reviewed  COMPREHENSIVE METABOLIC PANEL - Abnormal; Notable for the following components:      Result Value   Potassium 3.4 (*)    Calcium 8.8 (*)    Albumin 3.4 (*)    AST 11 (*)    All other components within normal limits  CBC WITH DIFFERENTIAL/PLATELET - Abnormal; Notable for the following components:   Hemoglobin 10.6 (*)    HCT 33.8 (*)    MCV 73.2 (*)    MCH 22.9 (*)    RDW 20.7 (*)    Eosinophils Absolute 1.7 (*)    All other components within normal limits  URINALYSIS, ROUTINE W REFLEX MICROSCOPIC - Abnormal; Notable for the following components:   Ketones, ur 80 (*)    Protein, ur 30 (*)    Bacteria, UA RARE (*)    All other components within normal limits  BRAIN NATRIURETIC PEPTIDE - Abnormal; Notable for the following components:   B Natriuretic Peptide 238.0 (*)    All other components within normal limits  CK  MAGNESIUM  VITAMIN D 25 HYDROXY (VIT D DEFICIENCY, FRACTURES)  BASIC METABOLIC PANEL  MAGNESIUM  CBC    EKG EKG Interpretation  Date/Time:  Thursday September 09 2021 09:13:48 EDT Ventricular Rate:  103 PR Interval:    QRS Duration: 94 QT Interval:  356 QTC Calculation: 489 R Axis:   74 Text Interpretation: Atrial fibrillation Probable anterior infarct, age indeterminate No significant change since prior 5/22  Confirmed by Aletta Edouard [phone]) on 09/09/2021 9:28:24 AM  Radiology CT L-SPINE NO CHARGE  Result Date: 09/09/2021 CLINICAL DATA:  Lower back pain EXAM: CT LUMBAR SPINE WITHOUT CONTRAST TECHNIQUE: Multidetector CT imaging of the lumbar spine was performed without intravenous contrast administration. Multiplanar CT image reconstructions were also generated. RADIATION DOSE REDUCTION: This exam was performed according  to the departmental dose-optimization program which includes automated exposure control, adjustment of the mA and/or kV according to patient size and/or use of iterative reconstruction technique. COMPARISON:  CT 07/16/2020 FINDINGS: Segmentation: Standard Alignment: Levoconvex lumbar curvature. Vertebrae: Prior T9-L1 anterior and posterior fusion. There is pseudoarthrosis with progressive subsidence at T9-T10 with tilting of the intervertebral disc spacer. Questionable peripheral arthrodesis at T10-T11 and T11-T12. Solid arthrodesis at T12-L1. Progressive T9 pedicle screw loosening. Separate L2-S1 fusion hardware is intact without evidence of loosening. There is no evidence of acute lumbar spine fracture. Paraspinal and other soft tissues: Reported separately on CT abdomen and pelvis. Disc levels: There is multilevel endplate spurring and bony hypertrophy which may result in neural foraminal stenosis in the lower thoracic spine. Prior posterior decompression with patent spinal canal throughout the lumbar spine. There is prominent bony spurring posteriorly at T12-L1 which likely results in moderate spinal canal stenosis, unchanged from previous exams. IMPRESSION: No evidence of acute lumbar spine fracture. Pseudoarthrosis with progressive subsidence at T9-T10 with tilting of the intervertebral disc spacer. Progressive loosening of the T9 pedicle screws. Otherwise unchanged thoracolumbar fusion hardware at other levels. Electronically Signed   By: Maurine Simmering M.D.   On: 09/09/2021 12:11   CT Head  Wo Contrast  Result Date: 09/09/2021 CLINICAL DATA:  Head trauma, minor (Age >= 65y) EXAM: CT HEAD WITHOUT CONTRAST TECHNIQUE: Contiguous axial images were obtained from the base of the skull through the vertex without intravenous contrast. RADIATION DOSE REDUCTION: This exam was performed according to the departmental dose-optimization program which includes automated exposure control, adjustment of the mA and/or kV according to patient size and/or use of iterative reconstruction technique. COMPARISON:  CT report 10/08/2020, images not retrievable at the time of this dictation FINDINGS: Brain: No evidence of acute intracranial hemorrhage or extra-axial collection.No concerning mass effect.The ventricles are normal in size.Scattered subcortical and periventricular white matter hypodensities, nonspecific but likely sequela of chronic small vessel ischemic disease.Mild cerebral atrophy Vascular: No hyperdense vessel or unexpected calcification. Skull: Negative. Sinuses/Orbits: The paranasal sinuses are predominantly clear. Mastoid air cells are clear. Orbits are unremarkable. Other: None. IMPRESSION: No acute intracranial abnormality. Mild sequela of chronic small vessel ischemic disease. Electronically Signed   By: Maurine Simmering M.D.   On: 09/09/2021 12:03   CT CHEST ABDOMEN PELVIS W CONTRAST  Result Date: 09/09/2021 CLINICAL DATA:  Abdominal trauma, blunt EXAM: CT CHEST, ABDOMEN, AND PELVIS WITH CONTRAST TECHNIQUE: Multidetector CT imaging of the chest, abdomen and pelvis was performed following the standard protocol during bolus administration of intravenous contrast. RADIATION DOSE REDUCTION: This exam was performed according to the departmental dose-optimization program which includes automated exposure control, adjustment of the mA and/or kV according to patient size and/or use of iterative reconstruction technique. CONTRAST:  199m OMNIPAQUE IOHEXOL 300 MG/ML  SOLN COMPARISON:  CT 07/16/2020. FINDINGS: CT  CHEST FINDINGS Cardiovascular: Normal cardiac size.No pericardial disease.Normal size main and branch pulmonary arteries.The ascending aorta measures up to 4.2 cm (series 11, image 33). Mediastinum/Nodes: No lymphadenopathy.The thyroid is unremarkable.Esophagus is unremarkable. Lungs/Pleura: Trace respiratory secretions in the trachea. There is mild bronchial wall thickening.No focal airspace consolidation. Bibasilar hypoventilatory changes and atelectasis.No pleural effusion.No pneumothorax. Musculoskeletal: Pseudoarthrosis at T9-T10 with progressive perihardware lucency and subsidence. Progression of T9 pedicle screw loosening.Lower thoracic fusion hardware is otherwise unchanged. Subacute-chronic appearing left posterior second rib fracture. There is an anatomic right shoulder arthroplasty. Mild focal stranding along the right anterior lower chest wall and right lower back at the level of T12-L1.  CT ABDOMEN PELVIS FINDINGS Hepatobiliary: No hepatic injury or perihepatic hematoma. No gallstones, gallbladder wall thickening, or biliary dilatation. Pancreas: Unremarkable. No pancreatic ductal dilatation or surrounding inflammatory changes. Spleen: No splenic injury or perisplenic hematoma. Adrenals/Urinary Tract: No adrenal hemorrhage or renal injury identified. Bladder is unremarkable. Unchanged bilateral renal cysts. No hydronephrosis or nephrolithiasis. Stomach/Bowel: The stomach is within normal limits. No evidence of bowel obstruction. Normal appendix. Mild rectal wall thickening. Vascular/Lymphatic: No AAA.  No lymphadenopathy. Reproductive: Unremarkable. Other: Small fat containing left inguinal hernia. No ascites. No free air. No bowel containing hernia. Musculoskeletal: No acute osseous abnormality. Moderate bilateral hip osteoarthritis. Unchanged lumbar fusion hardware, intact without evidence of loosening. IMPRESSION: Subcutaneous soft tissue stranding along the right anterior lower chest and right lower  back at the level of T12-L1, could represent contusion. No other evidence of acute trauma in the chest, abdomen, or pelvis. Progressive endplate subsidence at S0-F09 and loosening of the T9 pedicle screws. Spinal fusion hardware is otherwise intact. Ascending aortic aneurysm measuring up to 4.2 cm. Recommend annual imaging followup by CTA or MRA. This recommendation follows 2010 ACCF/AHA/AATS/ACR/ASA/SCA/SCAI/SIR/STS/SVM Guidelines for the Diagnosis and Management of Patients with Thoracic Aortic Disease. Circulation. 2010; 121: N235-T732. Aortic aneurysm NOS (ICD10-I71.9) Electronically Signed   By: Maurine Simmering M.D.   On: 09/09/2021 11:57   DG Chest Port 1 View  Result Date: 09/09/2021 CLINICAL DATA:  Chest pain EXAM: PORTABLE CHEST 1 VIEW COMPARISON:  06/14/2021 FINDINGS: Low lung volumes. Chronic interstitial changes. No pleural effusion or pneumothorax. Similar cardiomediastinal contours. Partially imaged right shoulder arthroplasty. IMPRESSION: No acute process in the chest. Electronically Signed   By: Macy Mis M.D.   On: 09/09/2021 09:07    Procedures Procedures    Medications Ordered in ED Medications  morphine (PF) 4 MG/ML injection 4 mg (has no administration in time range)  sodium chloride 0.9 % bolus 500 mL (has no administration in time range)    ED Course/ Medical Decision Making/ A&P Clinical Course as of 09/09/21 1810  Thu Sep 09, 2021  0910 Portable chest x-ray interpreted by me as no acute pneumothorax.  Awaiting radiology reading. [MB]  1314 Discussed with Dr. Wynetta Emery [facility] hospitalist who will evaluate patient for admission. [MB]    Clinical Course User Index [MB] Hayden Rasmussen, MD                           Medical Decision Making Amount and/or Complexity of Data Reviewed Labs: ordered. Radiology: ordered.  Risk Prescription drug management. Decision regarding hospitalization.  This patient complains of low back pain, generalized weakness, dehydration;  this involves an extensive number of treatment Options and is a complaint that carries with it a high risk of complications and morbidity. The differential includes musculoskeletal pain, spinal fracture, neurologic injury, intra-abdominal injury, dehydration, metabolic derangement  I ordered, reviewed and interpreted labs, which included CBC with normal white count, hemoglobin low stable from priors, chemistries fairly unremarkable, urinalysis with ketones as of infection, BNP mildly elevated compared to priors, CK unremarkable I ordered medication IV fluids IV pain medication and reviewed PMP when indicated. I ordered imaging studies which included chest x-ray and CT abdomen and pelvis with CT head, CT lumbar spine and I independently    visualized and interpreted imaging which showed subcu edema consistent with contusion, no other, does have some hardware loosening in his back Additional history obtained from EMS, patient's sister Previous records obtained and reviewed in epic  including recent cardiology note and discharge summary from prior hospitalization I consulted Dr. Wynetta Emery [facility] hospitalist and discussed lab and imaging findings and discussed disposition.  Cardiac monitoring reviewed, A-fib with controlled ventricular response Social determinants considered, patient is limited in his physical activity Critical Interventions: None  After the interventions stated above, I reevaluated the patient and found patient still with significant pain and inability to ambulate and care for self Admission and further testing considered, patient would benefit from mission the hospital for further management of his pain along with physical therapy occupational therapy and possible need for skilled nursing facility.  Patient in agreement with plan.          Final Clinical Impression(s) / ED Diagnoses Final diagnoses:  Aneurysm of ascending aorta without rupture ([place])  Chronic right-sided low  back pain, unspecified whether sciatica present  Generalized weakness  Chest wall contusion, right, initial encounter    Rx / DC Orders ED Discharge Orders     None         Hayden Rasmussen, MD 09/09/21 1818

## 2021-09-09 NOTE — Assessment & Plan Note (Signed)
-   Secondary to fall at home, treating supportively

## 2021-09-09 NOTE — Assessment & Plan Note (Signed)
-   He appears compensated likely due to poor oral intake over last couple of days - Plan to resume home Lasix 40 mg once daily reduced from twice daily until his oral intake has improved to baseline

## 2021-09-09 NOTE — Assessment & Plan Note (Signed)
-   Patient reports he has been weaned off of the chronic opioids that he been addicted to in the past

## 2021-09-09 NOTE — ED Triage Notes (Signed)
Patient brought in by EMS for for lower back pain after a fall 3 days ago.  Patient states that he hit the side table and fell to the floor.  Patient has a large bruise to the right side of the chest and to the right lower back.

## 2021-09-09 NOTE — Assessment & Plan Note (Addendum)
-   he has high tolerance for opioids given long history of usage  -- I offered to refer him out to a pain management clinic but he has declined

## 2021-09-09 NOTE — Assessment & Plan Note (Signed)
-   Currently stable continue to monitor closely

## 2021-09-09 NOTE — Assessment & Plan Note (Addendum)
-   Follow-up 25-hydroxy vitamin D level - very low at 9.18 -- restarted high dose vitamin D 50 K IU caps

## 2021-09-10 ENCOUNTER — Inpatient Hospital Stay (HOSPITAL_COMMUNITY): Payer: Medicare Other

## 2021-09-10 DIAGNOSIS — D6869 Other thrombophilia: Secondary | ICD-10-CM | POA: Diagnosis not present

## 2021-09-10 DIAGNOSIS — I5032 Chronic diastolic (congestive) heart failure: Secondary | ICD-10-CM

## 2021-09-10 DIAGNOSIS — R29898 Other symptoms and signs involving the musculoskeletal system: Secondary | ICD-10-CM | POA: Diagnosis present

## 2021-09-10 DIAGNOSIS — I4891 Unspecified atrial fibrillation: Secondary | ICD-10-CM | POA: Diagnosis not present

## 2021-09-10 DIAGNOSIS — R262 Difficulty in walking, not elsewhere classified: Secondary | ICD-10-CM | POA: Diagnosis not present

## 2021-09-10 LAB — CBC
HCT: 30.7 % — ABNORMAL LOW (ref 39.0–52.0)
Hemoglobin: 9.5 g/dL — ABNORMAL LOW (ref 13.0–17.0)
MCH: 22.9 pg — ABNORMAL LOW (ref 26.0–34.0)
MCHC: 30.9 g/dL (ref 30.0–36.0)
MCV: 74 fL — ABNORMAL LOW (ref 80.0–100.0)
Platelets: 166 10*3/uL (ref 150–400)
RBC: 4.15 MIL/uL — ABNORMAL LOW (ref 4.22–5.81)
RDW: 20.5 % — ABNORMAL HIGH (ref 11.5–15.5)
WBC: 7.6 10*3/uL (ref 4.0–10.5)
nRBC: 0 % (ref 0.0–0.2)

## 2021-09-10 LAB — BASIC METABOLIC PANEL
Anion gap: 6 (ref 5–15)
BUN: 12 mg/dL (ref 8–23)
CO2: 21 mmol/L — ABNORMAL LOW (ref 22–32)
Calcium: 8.5 mg/dL — ABNORMAL LOW (ref 8.9–10.3)
Chloride: 113 mmol/L — ABNORMAL HIGH (ref 98–111)
Creatinine, Ser: 0.8 mg/dL (ref 0.61–1.24)
GFR, Estimated: >60 mL/min (ref >60)
Glucose, Bld: 100 mg/dL — ABNORMAL HIGH (ref 70–99)
Potassium: 3.4 mmol/L — ABNORMAL LOW (ref 3.5–5.1)
Sodium: 140 mmol/L (ref 135–145)

## 2021-09-10 LAB — MAGNESIUM: Magnesium: 1.9 mg/dL (ref 1.7–2.4)

## 2021-09-10 MED ORDER — LORAZEPAM 1 MG PO TABS
1.0000 mg | ORAL_TABLET | Freq: Once | ORAL | Status: AC | PRN
  Administered 2021-09-10: 1 mg via ORAL
  Filled 2021-09-10: qty 1

## 2021-09-10 MED ORDER — POTASSIUM CHLORIDE CRYS ER 20 MEQ PO TBCR
40.0000 meq | EXTENDED_RELEASE_TABLET | Freq: Once | ORAL | Status: AC
  Administered 2021-09-10: 40 meq via ORAL
  Filled 2021-09-10: qty 2

## 2021-09-10 MED ORDER — VITAMIN D (ERGOCALCIFEROL) 1.25 MG (50000 UNIT) PO CAPS
50000.0000 [IU] | ORAL_CAPSULE | ORAL | Status: DC
  Administered 2021-09-10: 50000 [IU] via ORAL
  Filled 2021-09-10: qty 1

## 2021-09-10 MED ORDER — LIDOCAINE 5 % EX PTCH
2.0000 | MEDICATED_PATCH | CUTANEOUS | Status: DC
  Administered 2021-09-10 – 2021-09-12 (×3): 2 via TRANSDERMAL
  Filled 2021-09-10 (×3): qty 2

## 2021-09-10 NOTE — Progress Notes (Signed)
PROGRESS NOTE   Kenneth Rhodes  QIO:[id number] DOB: [birth year] DOA: 09/09/2021 PCP: [facility]   Chief Complaint  Patient presents with   Back Pain   Level of care: Med-Surg  Brief Admission History:  76 y.o. male with medical history significant of chronic back pain, opioid dependence, chronic lower extremity weakness, he is dependent of a walker to ambulate.  He has chronic atrial fibrillation, BPH, diastolic heart failure, chronic anticoagulation with apixaban, obesity with sleep apnea who had been living home alone but having aides to assist him.  He reports that he has not seen his aide in the last 3 days.  Reports that he fell down at home 3 days ago and EMS presented to his home however they placed him in bed and did not bring him into the hospital.  He laid in the bed for the last 3 days unable to ambulate.  He has urinated in soiled himself in bed and has not had anything to eat or drink or taken any of his medications in the last 2 to 3 days due to being unable to ambulate.  He now complains of chronic back pain acutely exacerbated by recent fall.  He reports that he has bruising on the right side of his anterior chest wall.  Overall he feels generally weak but his main complaint is inability to ambulate.  His overall goal is to get back home however if he is not able to ambulate he understands that he likely is going to need some rehabilitation.   ED Course: He was evaluated in the ED and noted to have stable labs and his trauma work-up imaging was essentially unremarkable.  He is quite tender in has not been able to ambulate.  He does have strength in both legs.  His head CT did not show any acute findings.  He also had a CT of the lumbar spine.  His chest x-ray did not show any acute findings.  His pain is uncontrolled at this time.  He is being admitted for pain management and therapy and disposition planning.  He may not be able to return home living alone.    Assessment and Plan: * Inability to walk - I have requested for inpatient PT OT evaluation and TOC evaluation for rehabilitation -- I have requested an MRI of T spine and L spine given his persistent symptoms  -- he worked with PT today but his lower extremities were weak and he had severe paresthesia of both feet when trying to work with PT   Contusion - Secondary to fall at home, treating supportively  Lower extremity weakness -- follow up MRI T/L spine studies   Hypokalemia - Oral replacement given, check magnesium, recheck BMP tomorrow morning  Functional quadriplegia - Patient has been bedbound for the past 3 days at home - Hopefully with aggressive therapies he can get him out of bed and get him ambulatory again -- follow up MRI study, PT recommending SNF placement which we are working on   Fall at home - He remains very high fall risk and we have asked for a PT OT evaluation in addition to [facility] evaluation for possible SNF placement for short-term rehab  Acquired thrombophilia (HCC) - She is fully anticoagulated with apixaban which we have restarted  Atrial fibrillation (HCC) - He is rate managed with oral metoprolol which is restarted and fully anticoagulated with apixaban - He is followed by Dr. Wyline Mood with cardiology service  Chronic diastolic CHF (congestive  heart failure) (HCC) - He appears compensated likely due to poor oral intake over last couple of days - Plan to resume home Lasix 40 mg once daily reduced from twice daily until his oral intake has improved to baseline  GAD (generalized anxiety disorder) - Currently stable continue to monitor closely  Vitamin D deficiency - Follow-up 25-hydroxy vitamin D level  Opioid dependence with opioid-induced mood disorder (HCC) - Patient reports he has been weaned off of the chronic opioids that he been addicted to in the past  BPH (benign prostatic hyperplasia) - Resume home Flomax nightly  Hypothyroidism - Resume  home levothyroxine before breakfast  Chronic pain - Is no longer on chronic opioid therapy, no longer on fentanyl and twice daily long-acting OxyContin - We have ordered acetaminophen every 6 hours around-the-clock in addition to OxyContin 5 to 10 mg every 6 hours as needed pain  Depression, major, recurrent (HCC) - Patient reports he is not having symptoms at this time continue to monitor  Opiate addiction (HCC) - Careful with opioids, pain management as noted   DVT prophylaxis: apixaban  Code Status: Full  Family Communication:  Disposition: Status is: Observation The patient remains OBS appropriate and will d/c before 2 midnights.   Consultants:  PT Procedures:   Antimicrobials:    Subjective: Pt reports he had loss of sensation in feet when trying to work with PT this morning Objective: Vitals:   09/10/21 0455 09/10/21 0607 09/10/21 1224 09/10/21 1449  BP:  (!) 166/93 (!) 161/92 (!) 144/77  Pulse:  87 97 88  Resp:  20 20 18   Temp:  98.4 F (36.9 C) 98.5 F (36.9 C) 97.8 F (36.6 C)  TempSrc:  Oral Oral Oral  SpO2:  96% 97% 100%  Weight: 119.7 kg     Height:        Intake/Output Summary (Last 24 hours) at 09/10/2021 1549 Last data filed at 09/10/2021 1300 Gross per 24 hour  Intake 480 ml  Output 1500 ml  Net -1020 ml   Filed Weights   09/09/21 0852 09/09/21 1540 09/10/21 0455  Weight: 122.5 kg 120.8 kg 119.7 kg   Examination:  General exam: Appears calm and comfortable  Respiratory system: Clear to auscultation. Respiratory effort normal. Cardiovascular system: normal S1 & S2 heard. No JVD, murmurs, rubs, gallops or clicks. No pedal edema. Gastrointestinal system: Abdomen is nondistended, soft and nontender. No organomegaly or masses felt. Normal bowel sounds heard. Central nervous system: Alert and oriented. No focal neurological deficits. Extremities: Symmetric 4/5 power in lower extremities. Skin: No rashes, lesions or ulcers. Psychiatry: Judgement  and insight appear normal. Mood & affect appropriate.   Data Reviewed: I have personally reviewed following labs and imaging studies  CBC: Recent Labs  Lab 09/09/21 0850 09/10/21 0520  WBC 8.4 7.6  NEUTROABS 5.1  --   HGB 10.6* 9.5*  HCT 33.8* 30.7*  MCV 73.2* 74.0*  PLT 170 166    Basic Metabolic Panel: Recent Labs  Lab 09/09/21 0850 09/10/21 0520  NA 141 140  K 3.4* 3.4*  CL 111 113*  CO2 22 21*  GLUCOSE 93 100*  BUN 11 12  CREATININE 0.79 0.80  CALCIUM 8.8* 8.5*  MG 2.0 1.9    CBG: No results for input(s): "GLUCAP" in the last 168 hours.  No results found for this or any previous visit (from the past 240 hour(s)).   Radiology Studies: CT L-SPINE NO CHARGE  Result Date: 09/09/2021 CLINICAL DATA:  Lower back pain  EXAM: CT LUMBAR SPINE WITHOUT CONTRAST TECHNIQUE: Multidetector CT imaging of the lumbar spine was performed without intravenous contrast administration. Multiplanar CT image reconstructions were also generated. RADIATION DOSE REDUCTION: This exam was performed according to the departmental dose-optimization program which includes automated exposure control, adjustment of the mA and/or kV according to patient size and/or use of iterative reconstruction technique. COMPARISON:  CT 07/16/2020 FINDINGS: Segmentation: Standard Alignment: Levoconvex lumbar curvature. Vertebrae: Prior T9-L1 anterior and posterior fusion. There is pseudoarthrosis with progressive subsidence at T9-T10 with tilting of the intervertebral disc spacer. Questionable peripheral arthrodesis at T10-T11 and T11-T12. Solid arthrodesis at T12-L1. Progressive T9 pedicle screw loosening. Separate L2-S1 fusion hardware is intact without evidence of loosening. There is no evidence of acute lumbar spine fracture. Paraspinal and other soft tissues: Reported separately on CT abdomen and pelvis. Disc levels: There is multilevel endplate spurring and bony hypertrophy which may result in neural foraminal  stenosis in the lower thoracic spine. Prior posterior decompression with patent spinal canal throughout the lumbar spine. There is prominent bony spurring posteriorly at T12-L1 which likely results in moderate spinal canal stenosis, unchanged from previous exams. IMPRESSION: No evidence of acute lumbar spine fracture. Pseudoarthrosis with progressive subsidence at T9-T10 with tilting of the intervertebral disc spacer. Progressive loosening of the T9 pedicle screws. Otherwise unchanged thoracolumbar fusion hardware at other levels. Electronically Signed   By: Caprice Renshaw M.D.   On: 09/09/2021 12:11   CT Head Wo Contrast  Result Date: 09/09/2021 CLINICAL DATA:  Head trauma, minor (Age >= 65y) EXAM: CT HEAD WITHOUT CONTRAST TECHNIQUE: Contiguous axial images were obtained from the base of the skull through the vertex without intravenous contrast. RADIATION DOSE REDUCTION: This exam was performed according to the departmental dose-optimization program which includes automated exposure control, adjustment of the mA and/or kV according to patient size and/or use of iterative reconstruction technique. COMPARISON:  CT report 10/08/2020, images not retrievable at the time of this dictation FINDINGS: Brain: No evidence of acute intracranial hemorrhage or extra-axial collection.No concerning mass effect.The ventricles are normal in size.Scattered subcortical and periventricular white matter hypodensities, nonspecific but likely sequela of chronic small vessel ischemic disease.Mild cerebral atrophy Vascular: No hyperdense vessel or unexpected calcification. Skull: Negative. Sinuses/Orbits: The paranasal sinuses are predominantly clear. Mastoid air cells are clear. Orbits are unremarkable. Other: None. IMPRESSION: No acute intracranial abnormality. Mild sequela of chronic small vessel ischemic disease. Electronically Signed   By: Caprice Renshaw M.D.   On: 09/09/2021 12:03   CT CHEST ABDOMEN PELVIS W CONTRAST  Result Date:  09/09/2021 CLINICAL DATA:  Abdominal trauma, blunt EXAM: CT CHEST, ABDOMEN, AND PELVIS WITH CONTRAST TECHNIQUE: Multidetector CT imaging of the chest, abdomen and pelvis was performed following the standard protocol during bolus administration of intravenous contrast. RADIATION DOSE REDUCTION: This exam was performed according to the departmental dose-optimization program which includes automated exposure control, adjustment of the mA and/or kV according to patient size and/or use of iterative reconstruction technique. CONTRAST:  OMNIPAQUE IOHEXOL 300 MG/ML  SOLN COMPARISON:  CT 07/16/2020. FINDINGS: CT CHEST FINDINGS Cardiovascular: Normal cardiac size.No pericardial disease.Normal size main and branch pulmonary arteries.The ascending aorta measures up to 4.2 cm (series 11, image 33). Mediastinum/Nodes: No lymphadenopathy.The thyroid is unremarkable.Esophagus is unremarkable. Lungs/Pleura: Trace respiratory secretions in the trachea. There is mild bronchial wall thickening.No focal airspace consolidation. Bibasilar hypoventilatory changes and atelectasis.No pleural effusion.No pneumothorax. Musculoskeletal: Pseudoarthrosis at T9-T10 with progressive perihardware lucency and subsidence. Progression of T9 pedicle screw loosening.Lower thoracic fusion hardware is  otherwise unchanged. Subacute-chronic appearing left posterior second rib fracture. There is an anatomic right shoulder arthroplasty. Mild focal stranding along the right anterior lower chest wall and right lower back at the level of T12-L1. CT ABDOMEN PELVIS FINDINGS Hepatobiliary: No hepatic injury or perihepatic hematoma. No gallstones, gallbladder wall thickening, or biliary dilatation. Pancreas: Unremarkable. No pancreatic ductal dilatation or surrounding inflammatory changes. Spleen: No splenic injury or perisplenic hematoma. Adrenals/Urinary Tract: No adrenal hemorrhage or renal injury identified. Bladder is unremarkable. Unchanged bilateral  renal cysts. No hydronephrosis or nephrolithiasis. Stomach/Bowel: The stomach is within normal limits. No evidence of bowel obstruction. Normal appendix. Mild rectal wall thickening. Vascular/Lymphatic: No AAA.  No lymphadenopathy. Reproductive: Unremarkable. Other: Small fat containing left inguinal hernia. No ascites. No free air. No bowel containing hernia. Musculoskeletal: No acute osseous abnormality. Moderate bilateral hip osteoarthritis. Unchanged lumbar fusion hardware, intact without evidence of loosening. IMPRESSION: Subcutaneous soft tissue stranding along the right anterior lower chest and right lower back at the level of T12-L1, could represent contusion. No other evidence of acute trauma in the chest, abdomen, or pelvis. Progressive endplate subsidence at T9-T10 and loosening of the T9 pedicle screws. Spinal fusion hardware is otherwise intact. Ascending aortic aneurysm measuring up to 4.2 cm. Recommend annual imaging followup by CTA or MRA. This recommendation follows 2010 ACCF/AHA/AATS/ACR/ASA/SCA/SCAI/SIR/STS/SVM Guidelines for the Diagnosis and Management of Patients with Thoracic Aortic Disease. Circulation. 2010; 121: Y694-W546. Aortic aneurysm NOS (ICD10-I71.9) Electronically Signed   By: Caprice Renshaw M.D.   On: 09/09/2021 11:57   DG Chest Port 1 View  Result Date: 09/09/2021 CLINICAL DATA:  Chest pain EXAM: PORTABLE CHEST 1 VIEW COMPARISON:  06/14/2021 FINDINGS: Low lung volumes. Chronic interstitial changes. No pleural effusion or pneumothorax. Similar cardiomediastinal contours. Partially imaged right shoulder arthroplasty. IMPRESSION: No acute process in the chest. Electronically Signed   By: Guadlupe Spanish M.D.   On: 09/09/2021 09:07    Scheduled Meds:  acetaminophen  650 mg Oral Q6H   Or   acetaminophen  650 mg Rectal Q6H   apixaban  5 mg Oral BID   docusate sodium  100 mg Oral BID   furosemide  40 mg Oral Daily   levothyroxine  50 mcg Oral QAC breakfast   metoprolol tartrate   12.5 mg Oral BID   nystatin   Topical BID   pantoprazole  40 mg Oral Daily   tamsulosin  0.4 mg Oral QPC supper   Vitamin D (Ergocalciferol)  50,000 Units Oral Q7 days   Continuous Infusions:   LOS: 1 day   Time spent: 37 mins  Kelsy Polack Laural Benes, MD How to contact the [facility] Attending or Consulting provider 7A - 7P or covering provider during after hours 7P -7A, for this patient?  Check the care team in [facility] and look for a) attending/consulting TRH provider listed and b) the [facility] team listed Log into [email or web address] and use [place]'s universal password to access. If you do not have the password, please contact the hospital operator. Locate the [facility] provider you are looking for under [facility] and page to a number that you can be directly reached. If you still have difficulty reaching the provider, please page the [facility] (Director on Call) for the Hospitalists listed on amion for assistance.  09/10/2021, 3:49 PM

## 2021-09-10 NOTE — TOC Initial Note (Signed)
Transition of Care [facility]) - Initial/Assessment Note    Patient Details  Name: Kenneth Rhodes MRN: [id number] Date of Birth: September 16, 1945  [facility] CM/SW Contact:    Annice Needy, LCSW Phone Number: 09/10/2021, 11:07 AM  Clinical Narrative:                 Patient from home alone. Admitted due to inability to walk. Ambulates with a walker at baseline. Has a wc and cane in the home. Has not ambulated in 4-5 days due to a fall. Aide did not show up for 3 days. PT recommends SNF. Patient is agreeable to SNF. Referral sent to SNFs of choice.  Referral made to [facility] as requested in consult from attending.   Expected Discharge Plan: Skilled Nursing Facility Barriers to Discharge: Continued Medical Work up   Patient Goals and CMS Choice Patient states their goals for this hospitalization and ongoing recovery are:: rehab then home      Expected Discharge Plan and Services Expected Discharge Plan: Skilled Nursing Facility       Living arrangements for the past 2 months: Apartment                                      Prior Living Arrangements/Services Living arrangements for the past 2 months: Apartment Lives with:: Self Patient language and need for interpreter reviewed:: Yes Do you feel safe going back to the place where you live?: Yes      Need for Family Participation in Patient Care: No (Comment) Care giver support system in place?: No (comment) Current home services: DME (walker, wc, cane) Criminal Activity/Legal Involvement Pertinent to Current Situation/Hospitalization: No - Comment as needed  Activities of Daily Living Home Assistive Devices/Equipment: Environmental consultant (specify type), Wheelchair ADL Screening (condition at time of admission) Patient's cognitive ability adequate to safely complete daily activities?: Yes Is the patient deaf or have difficulty hearing?: No Does the patient have difficulty seeing, even when wearing glasses/contacts?:  No Does the patient have difficulty concentrating, remembering, or making decisions?: No Patient able to express need for assistance with ADLs?: Yes Does the patient have difficulty dressing or bathing?: Yes Independently performs ADLs?: No Communication: Independent Dressing (OT): Needs assistance Is this a change from baseline?: Change from baseline, expected to last <3days Grooming: Needs assistance Is this a change from baseline?: Change from baseline, expected to last <3 days Feeding: Independent Bathing: Needs assistance Is this a change from baseline?: Change from baseline, expected to last <3 days Toileting: Needs assistance Is this a change from baseline?: Change from baseline, expected to last <3 days In/Out Bed: Needs assistance Is this a change from baseline?: Change from baseline, expected to last <3 days Walks in Home: Independent with device (comment) Does the patient have difficulty walking or climbing stairs?: Yes Weakness of Legs: Both Weakness of Arms/Hands: Both  Permission Sought/Granted                  Emotional Assessment       Orientation: : Oriented to Self, Oriented to Place, Oriented to  Time, Oriented to Situation Alcohol / Substance Use: Not Applicable Psych Involvement: No (comment)  Admission diagnosis:  Inability to walk [R26.2] Aneurysm of ascending aorta without rupture (HCC) [I71.21] Lower extremity weakness [R29.898] Patient Active Problem List   Diagnosis Date Noted   Lower extremity weakness 09/10/2021   Inability to walk 09/09/2021  Atrial fibrillation (HCC) 09/09/2021   Acquired thrombophilia (HCC) 09/09/2021   Fall at home 09/09/2021   Contusion 09/09/2021   Functional quadriplegia 09/09/2021   Hypokalemia 09/09/2021   AKI (acute kidney injury) (HCC) 07/16/2020   Chronic diastolic CHF (congestive heart failure) (HCC) 07/16/2020   Vomiting 07/16/2020   Paraplegia (HCC) 07/16/2020   Acute CHF (congestive heart failure)  (HCC) 02/14/2018   Hypertension 02/14/2018   Hyponatremia 02/14/2018   GAD (generalized anxiety disorder) 01/19/2015   Lower abdominal pain 10/22/2014   Diarrhea 10/22/2014   Hepatitis C 10/17/2014   Testosterone deficiency 06/09/2014   Vitamin D deficiency 06/09/2014   Opiate addiction (HCC) 03/26/2014   Opiate withdrawal (HCC) 03/26/2014   Depression, major, recurrent (HCC) 03/26/2014   Suicidal ideation 03/26/2014   Chronic pain 03/26/2014   Lumbar radiculopathy, chronic 03/26/2014   Hypothyroidism 03/26/2014   BPH (benign prostatic hyperplasia) 03/26/2014   Bladder spasm 03/26/2014   Opioid dependence with opioid-induced mood disorder [facility])    PCP:  [facility]:   [facility] [place], [place] - [address] [place] [address] Phone: [phone] Fax: [phone]     Social Determinants of Health (SDOH) Interventions    Readmission Risk Interventions     No data to display

## 2021-09-11 ENCOUNTER — Encounter (HOSPITAL_COMMUNITY): Payer: Self-pay | Admitting: Family Medicine

## 2021-09-11 DIAGNOSIS — D6859 Other primary thrombophilia: Secondary | ICD-10-CM | POA: Diagnosis present

## 2021-09-11 DIAGNOSIS — Y92003 Bedroom of unspecified non-institutional (private) residence as the place of occurrence of the external cause: Secondary | ICD-10-CM | POA: Diagnosis not present

## 2021-09-11 DIAGNOSIS — F411 Generalized anxiety disorder: Secondary | ICD-10-CM | POA: Diagnosis present

## 2021-09-11 DIAGNOSIS — E559 Vitamin D deficiency, unspecified: Secondary | ICD-10-CM | POA: Diagnosis present

## 2021-09-11 DIAGNOSIS — Z885 Allergy status to narcotic agent status: Secondary | ICD-10-CM | POA: Diagnosis not present

## 2021-09-11 DIAGNOSIS — B182 Chronic viral hepatitis C: Secondary | ICD-10-CM | POA: Diagnosis present

## 2021-09-11 DIAGNOSIS — I5032 Chronic diastolic (congestive) heart failure: Secondary | ICD-10-CM | POA: Diagnosis not present

## 2021-09-11 DIAGNOSIS — R52 Pain, unspecified: Secondary | ICD-10-CM | POA: Diagnosis present

## 2021-09-11 DIAGNOSIS — I7121 Aneurysm of the ascending aorta, without rupture: Secondary | ICD-10-CM | POA: Diagnosis present

## 2021-09-11 DIAGNOSIS — M4804 Spinal stenosis, thoracic region: Secondary | ICD-10-CM | POA: Diagnosis present

## 2021-09-11 DIAGNOSIS — S20211A Contusion of right front wall of thorax, initial encounter: Secondary | ICD-10-CM | POA: Diagnosis present

## 2021-09-11 DIAGNOSIS — R262 Difficulty in walking, not elsewhere classified: Secondary | ICD-10-CM | POA: Diagnosis present

## 2021-09-11 DIAGNOSIS — G9520 Unspecified cord compression: Secondary | ICD-10-CM | POA: Diagnosis present

## 2021-09-11 DIAGNOSIS — Z9181 History of falling: Secondary | ICD-10-CM | POA: Diagnosis not present

## 2021-09-11 DIAGNOSIS — F1124 Opioid dependence with opioid-induced mood disorder: Secondary | ICD-10-CM | POA: Diagnosis present

## 2021-09-11 DIAGNOSIS — R532 Functional quadriplegia: Secondary | ICD-10-CM | POA: Diagnosis present

## 2021-09-11 DIAGNOSIS — Z7401 Bed confinement status: Secondary | ICD-10-CM | POA: Diagnosis not present

## 2021-09-11 DIAGNOSIS — E669 Obesity, unspecified: Secondary | ICD-10-CM | POA: Diagnosis present

## 2021-09-11 DIAGNOSIS — F339 Major depressive disorder, recurrent, unspecified: Secondary | ICD-10-CM | POA: Diagnosis present

## 2021-09-11 DIAGNOSIS — N400 Enlarged prostate without lower urinary tract symptoms: Secondary | ICD-10-CM | POA: Diagnosis present

## 2021-09-11 DIAGNOSIS — W1830XA Fall on same level, unspecified, initial encounter: Secondary | ICD-10-CM | POA: Diagnosis present

## 2021-09-11 DIAGNOSIS — E039 Hypothyroidism, unspecified: Secondary | ICD-10-CM | POA: Diagnosis present

## 2021-09-11 DIAGNOSIS — Z6835 Body mass index (BMI) 35.0-35.9, adult: Secondary | ICD-10-CM | POA: Diagnosis not present

## 2021-09-11 DIAGNOSIS — D6869 Other thrombophilia: Secondary | ICD-10-CM | POA: Diagnosis not present

## 2021-09-11 DIAGNOSIS — Z7901 Long term (current) use of anticoagulants: Secondary | ICD-10-CM | POA: Diagnosis not present

## 2021-09-11 DIAGNOSIS — I110 Hypertensive heart disease with heart failure: Secondary | ICD-10-CM | POA: Diagnosis present

## 2021-09-11 DIAGNOSIS — E876 Hypokalemia: Secondary | ICD-10-CM | POA: Diagnosis present

## 2021-09-11 DIAGNOSIS — G8929 Other chronic pain: Secondary | ICD-10-CM | POA: Diagnosis present

## 2021-09-11 DIAGNOSIS — I4891 Unspecified atrial fibrillation: Secondary | ICD-10-CM | POA: Diagnosis not present

## 2021-09-11 LAB — BASIC METABOLIC PANEL
Anion gap: 8 (ref 5–15)
BUN: 11 mg/dL (ref 8–23)
CO2: 20 mmol/L — ABNORMAL LOW (ref 22–32)
Calcium: 8.7 mg/dL — ABNORMAL LOW (ref 8.9–10.3)
Chloride: 112 mmol/L — ABNORMAL HIGH (ref 98–111)
Creatinine, Ser: 0.77 mg/dL (ref 0.61–1.24)
GFR, Estimated: >60 mL/min (ref >60)
Glucose, Bld: 95 mg/dL (ref 70–99)
Potassium: 3.6 mmol/L (ref 3.5–5.1)
Sodium: 140 mmol/L (ref 135–145)

## 2021-09-11 LAB — MAGNESIUM: Magnesium: 1.9 mg/dL (ref 1.7–2.4)

## 2021-09-11 MED ORDER — OXYCODONE HCL 5 MG PO TABS
15.0000 mg | ORAL_TABLET | ORAL | Status: DC | PRN
  Administered 2021-09-11 – 2021-09-13 (×11): 15 mg via ORAL
  Filled 2021-09-11 (×11): qty 3

## 2021-09-11 MED ORDER — HYDROMORPHONE HCL 1 MG/ML IJ SOLN
0.5000 mg | INTRAMUSCULAR | Status: DC | PRN
  Administered 2021-09-11 – 2021-09-13 (×11): 0.5 mg via INTRAVENOUS
  Filled 2021-09-11 (×11): qty 0.5

## 2021-09-12 DIAGNOSIS — R262 Difficulty in walking, not elsewhere classified: Secondary | ICD-10-CM | POA: Diagnosis not present

## 2021-09-12 DIAGNOSIS — I4891 Unspecified atrial fibrillation: Secondary | ICD-10-CM | POA: Diagnosis not present

## 2021-09-12 DIAGNOSIS — D6869 Other thrombophilia: Secondary | ICD-10-CM | POA: Diagnosis not present

## 2021-09-12 DIAGNOSIS — I5032 Chronic diastolic (congestive) heart failure: Secondary | ICD-10-CM | POA: Diagnosis not present

## 2021-09-12 LAB — URINALYSIS, ROUTINE W REFLEX MICROSCOPIC
Bilirubin Urine: NEGATIVE
Glucose, UA: NEGATIVE mg/dL
Hgb urine dipstick: NEGATIVE
Ketones, ur: NEGATIVE mg/dL
Leukocytes,Ua: NEGATIVE
Nitrite: NEGATIVE
Protein, ur: NEGATIVE mg/dL
Specific Gravity, Urine: 1.009 (ref 1.005–1.030)
pH: 7 (ref 5.0–8.0)

## 2021-09-12 MED ORDER — SENNOSIDES-DOCUSATE SODIUM 8.6-50 MG PO TABS
1.0000 | ORAL_TABLET | Freq: Every day | ORAL | Status: DC
  Administered 2021-09-12: 1 via ORAL
  Filled 2021-09-12: qty 1

## 2021-09-12 MED ORDER — POLYETHYLENE GLYCOL 3350 17 G PO PACK
17.0000 g | PACK | Freq: Every day | ORAL | Status: DC
  Administered 2021-09-12 – 2021-09-13 (×2): 17 g via ORAL
  Filled 2021-09-12 (×2): qty 1

## 2021-09-12 MED ORDER — ACETAMINOPHEN 650 MG RE SUPP: 650.0000 mg | Freq: Four times a day (QID) | RECTAL | Status: DC

## 2021-09-12 MED ORDER — ACETAMINOPHEN 325 MG PO TABS
650.0000 mg | ORAL_TABLET | Freq: Four times a day (QID) | ORAL | Status: DC
  Administered 2021-09-12 – 2021-09-13 (×4): 650 mg via ORAL
  Filled 2021-09-12 (×4): qty 2

## 2021-09-12 NOTE — Progress Notes (Addendum)
Patient's insurance authorization is pending at this time.  Edwin Dada, MSW, LCSW Transitions of Care  Clinical Social Worker II [phone]

## 2021-09-12 NOTE — Plan of Care (Signed)
Pt is alert and oriented x 4. Pt noncompliant with turning due to intense pain. Pain meds given every 2-3 hours with no effect only with dilaudid. Robaxin given x 1.  Problem: Activity: Goal: Risk for activity intolerance will decrease Outcome: Not Progressing   Problem: Pain Managment: Goal: General experience of comfort will improve Outcome: Not Progressing   Problem: Education: Goal: Knowledge of General Education information will improve Description: Including pain rating scale, medication(s)/side effects and non-pharmacologic comfort measures Outcome: Progressing   Problem: Health Behavior/Discharge Planning: Goal: Ability to manage health-related needs will improve Outcome: Progressing   Problem: Clinical Measurements: Goal: Ability to maintain clinical measurements within normal limits will improve Outcome: Progressing Goal: Will remain free from infection Outcome: Progressing Goal: Respiratory complications will improve Outcome: Progressing Goal: Cardiovascular complication will be avoided Outcome: Progressing   Problem: Nutrition: Goal: Adequate nutrition will be maintained Outcome: Progressing   Problem: Coping: Goal: Level of anxiety will decrease Outcome: Progressing   Problem: Elimination: Goal: Will not experience complications related to bowel motility Outcome: Progressing Goal: Will not experience complications related to urinary retention Outcome: Progressing   Problem: Safety: Goal: Ability to remain free from injury will improve Outcome: Progressing   Problem: Skin Integrity: Goal: Risk for impaired skin integrity will decrease Outcome: Progressing

## 2021-09-13 DIAGNOSIS — R262 Difficulty in walking, not elsewhere classified: Secondary | ICD-10-CM | POA: Diagnosis not present

## 2021-09-13 DIAGNOSIS — D6869 Other thrombophilia: Secondary | ICD-10-CM | POA: Diagnosis not present

## 2021-09-13 DIAGNOSIS — R29898 Other symptoms and signs involving the musculoskeletal system: Secondary | ICD-10-CM

## 2021-09-13 DIAGNOSIS — R52 Pain, unspecified: Secondary | ICD-10-CM

## 2021-09-13 DIAGNOSIS — I5032 Chronic diastolic (congestive) heart failure: Secondary | ICD-10-CM | POA: Diagnosis not present

## 2021-09-13 DIAGNOSIS — F411 Generalized anxiety disorder: Secondary | ICD-10-CM

## 2021-09-13 DIAGNOSIS — I4891 Unspecified atrial fibrillation: Secondary | ICD-10-CM | POA: Diagnosis not present

## 2021-09-13 MED ORDER — TAMSULOSIN HCL 0.4 MG PO CAPS: 0.4000 mg | ORAL_CAPSULE | Freq: Every day | ORAL | Status: AC

## 2021-09-13 MED ORDER — IPRATROPIUM-ALBUTEROL 0.5-2.5 (3) MG/3ML IN SOLN: 3.0000 mL | RESPIRATORY_TRACT | Status: AC | PRN

## 2021-09-13 MED ORDER — HYDROMORPHONE HCL 1 MG/ML IJ SOLN
1.0000 mg | INTRAMUSCULAR | Status: DC | PRN
  Administered 2021-09-13: 1 mg via INTRAVENOUS
  Filled 2021-09-13: qty 1

## 2021-09-13 MED ORDER — VITAMIN D (ERGOCALCIFEROL) 1.25 MG (50000 UNIT) PO CAPS: 50000.0000 [IU] | ORAL_CAPSULE | ORAL | Status: AC

## 2021-09-13 MED ORDER — NALOXONE HCL 4 MG/0.1ML NA LIQD: 1.0000 | Freq: Once | NASAL | 0 refills | Status: AC | PRN

## 2021-09-13 MED ORDER — NYSTATIN 100000 UNIT/GM EX POWD: Freq: Two times a day (BID) | CUTANEOUS | 0 refills | Status: AC

## 2021-09-13 MED ORDER — DOCUSATE SODIUM 100 MG PO CAPS
100.0000 mg | ORAL_CAPSULE | Freq: Two times a day (BID) | ORAL | 0 refills | Status: AC
Start: 2021-09-13 — End: ?

## 2021-09-13 MED ORDER — ACETAMINOPHEN 325 MG PO TABS: 650.0000 mg | ORAL_TABLET | Freq: Four times a day (QID) | ORAL | Status: AC

## 2021-09-13 MED ORDER — FUROSEMIDE 40 MG PO TABS: 40.0000 mg | ORAL_TABLET | Freq: Every day | ORAL | Status: AC

## 2021-09-13 MED ORDER — LIDOCAINE 5 % EX PTCH: 2.0000 | MEDICATED_PATCH | CUTANEOUS | 0 refills | Status: AC

## 2021-09-13 MED ORDER — OXYCODONE HCL 20 MG PO TABS: 20.0000 mg | ORAL_TABLET | Freq: Four times a day (QID) | ORAL | 0 refills | Status: AC | PRN

## 2021-09-13 MED ORDER — SENNOSIDES-DOCUSATE SODIUM 8.6-50 MG PO TABS: 1.0000 | ORAL_TABLET | Freq: Every day | ORAL | Status: AC

## 2022-01-26 ENCOUNTER — Ambulatory Visit: Payer: Medicare Other | Attending: Cardiology | Admitting: Cardiology

## 2022-01-26 NOTE — Progress Notes (Deleted)
Clinical Summary Kenneth Rhodes is a 76 y.o.male  seen today as a new consult, referred by PA [facility] for the following medical problems.      1.Chronic diastolic HF - admit [facility] 05/2021 05/2021 echo LVEF >55%, mod LAE, diastolic indet - home weight 278 lbs, prior to admit 329 lbs. Since discharge slow trending down in weight. Taking lasix '40mg'$  bid - he is on jardiance   - lasix was decreased after 08/2021 admission with poor oral intake     2. Persistent afib - was diagnosed during 05/2021 admission - no palpitations - no bleeding on eliquis. - he reports pcp had stopped his lopressor at recent f/u, indication unclear        3. Anemia - - Hgb 9 at discharge.      4.History of paraplegia secondary to spinal cord infection Past Medical History:  Diagnosis Date   Anemia    Arthritis    Atrial fibrillation (HCC)    Bladder spasms    Blood transfusion without reported diagnosis    BPH (benign prostatic hypertrophy)    C. difficile diarrhea    CHF (congestive heart failure) (HCC)    Chronic hepatitis C (HCC)    Chronic pain    Depression    GERD (gastroesophageal reflux disease)    Hepatitis C    Hyperlipidemia    Hypertension    Hyponatremia    Hypothyroidism    Liver disease    MRSA (methicillin resistant staph aureus) culture positive    Neurogenic bladder    Obesity    Sleep apnea    Substance abuse (HCC)    pain medication     Allergies  Allergen Reactions   Hydrocodone Nausea Only     Current Outpatient Medications  Medication Sig Dispense Refill   acetaminophen (TYLENOL) 325 MG tablet Take 2 tablets (650 mg total) by mouth every 6 (six) hours.     apixaban (ELIQUIS) 5 MG TABS tablet Take 1 tablet (5 mg total) by mouth 2 (two) times daily. 60 tablet 6   docusate sodium (COLACE) 100 MG capsule Take 1 capsule (100 mg total) by mouth 2 (two) times daily. 10 capsule 0   furosemide (LASIX) 40 MG tablet Take 1 tablet (40 mg total) by mouth  daily. 30 tablet    ipratropium-albuterol (DUONEB) 0.5-2.5 (3) MG/3ML SOLN Take 3 mLs by nebulization every 4 (four) hours as needed (WHEEZING, SHORTNESS OF BREATH, COUGH, CHEST CONGESTION). 360 mL    levothyroxine (SYNTHROID) 50 MCG tablet Take 50 mcg by mouth daily before breakfast.     lidocaine (LIDODERM) 5 % Place 2 patches onto the skin daily. Remove & Discard patch within 12 hours or as directed by MD 30 patch 0   methocarbamol (ROBAXIN) 500 MG tablet Take 500 mg by mouth every 8 (eight) hours as needed.     metoprolol tartrate (LOPRESSOR) 25 MG tablet Take 0.5 mg by mouth 2 (two) times daily.     naloxone (NARCAN) nasal spray 4 mg/0.1 mL Place 1 spray into the nose once as needed for up to 1 dose (OPIOID OVERDOSE). 1 g 0   nystatin (MYCOSTATIN/NYSTOP) powder Apply topically 2 (two) times daily. 15 g 0   omeprazole (PRILOSEC) 20 MG capsule Take 20 mg by mouth daily.     senna-docusate (SENOKOT-S) 8.6-50 MG tablet Take 1 tablet by mouth at bedtime.     tamsulosin (FLOMAX) 0.4 MG CAPS capsule Take 1 capsule (0.4 mg total) by  mouth daily after supper. 30 capsule    No current facility-administered medications for this visit.     Past Surgical History:  Procedure Laterality Date   COLONOSCOPY     ovr 10 yrs ago in [place]. pt said normal exam   LUMBAR DISC SURGERY     2008, 2015, 2017   LUMBAR EPIDURAL INJECTION     MEDIAL PARTIAL KNEE REPLACEMENT Right    SPINE SURGERY     due to mrsa   SUPRAPUBIC CATHETER INSERTION       Allergies  Allergen Reactions   Hydrocodone Nausea Only      Family History  Problem Relation Age of Onset   Ovarian cancer Mother    Colon cancer Mother        dx age 48   AAA (abdominal aortic aneurysm) Father    Esophageal cancer Neg Hx    Kidney disease Neg Hx    Liver disease Neg Hx      Social History Mr. Guzzi reports that he quit smoking about 27 years ago. His smoking use included cigarettes. He has never used smokeless  tobacco. Mr. Cassedy reports no history of alcohol use.   Review of Systems CONSTITUTIONAL: No weight loss, fever, chills, weakness or fatigue.  HEENT: Eyes: No visual loss, blurred vision, double vision or yellow sclerae.No hearing loss, sneezing, congestion, runny nose or sore throat.  SKIN: No rash or itching.  CARDIOVASCULAR:  RESPIRATORY: No shortness of breath, cough or sputum.  GASTROINTESTINAL: No anorexia, nausea, vomiting or diarrhea. No abdominal pain or blood.  GENITOURINARY: No burning on urination, no polyuria NEUROLOGICAL: No headache, dizziness, syncope, paralysis, ataxia, numbness or tingling in the extremities. No change in bowel or bladder control.  MUSCULOSKELETAL: No muscle, back pain, joint pain or stiffness.  LYMPHATICS: No enlarged nodes. No history of splenectomy.  PSYCHIATRIC: No history of depression or anxiety.  ENDOCRINOLOGIC: No reports of sweating, cold or heat intolerance. No polyuria or polydipsia.  Marland Kitchen   Physical Examination There were no vitals filed for this visit. There were no vitals filed for this visit.  Gen: resting comfortably, no acute distress HEENT: no scleral icterus, pupils equal round and reactive, no palptable cervical adenopathy,  CV Resp: Clear to auscultation bilaterally GI: abdomen is soft, non-tender, non-distended, normal bowel sounds, no hepatosplenomegaly MSK: extremities are warm, no edema.  Skin: warm, no rash Neuro:  no focal deficits Psych: appropriate affect   Diagnostic Studies     Assessment and Plan  1.Chronic diastolic HF - has done well since recent admission with volume overload, stable home weights that have trended down further since discharge - recheck bmet/mg, contiue oral lasix   2. Persistent afib/acquired thrombophilia - request records from pcp, he reports was told to stop his lopressor. HR high 90s today, would restart unless clear contraindication, will review pcp note - continue  eliquis      Arnoldo Lenis, M.D., F.A.C.C.

## 2022-03-22 DIAGNOSIS — Z79899 Other long term (current) drug therapy: Secondary | ICD-10-CM | POA: Diagnosis not present

## 2022-03-22 DIAGNOSIS — R369 Urethral discharge, unspecified: Secondary | ICD-10-CM | POA: Diagnosis not present

## 2022-03-22 DIAGNOSIS — Z978 Presence of other specified devices: Secondary | ICD-10-CM | POA: Diagnosis not present

## 2022-05-20 DEATH — deceased
